# Patient Record
Sex: Female | Born: 1945 | Race: White | Hispanic: No | Smoking: Former smoker
Health system: Southern US, Community
[De-identification: ages and names within clinical notes are randomized; demographics above are authoritative.]

## PROBLEM LIST (undated history)

## (undated) DIAGNOSIS — F419 Anxiety disorder, unspecified: Secondary | ICD-10-CM

## (undated) DIAGNOSIS — G4730 Sleep apnea, unspecified: Secondary | ICD-10-CM

## (undated) DIAGNOSIS — K635 Polyp of colon: Secondary | ICD-10-CM

## (undated) DIAGNOSIS — J40 Bronchitis, not specified as acute or chronic: Secondary | ICD-10-CM

## (undated) DIAGNOSIS — K219 Gastro-esophageal reflux disease without esophagitis: Secondary | ICD-10-CM

## (undated) DIAGNOSIS — R0902 Hypoxemia: Secondary | ICD-10-CM

## (undated) DIAGNOSIS — H269 Unspecified cataract: Secondary | ICD-10-CM

## (undated) DIAGNOSIS — G43909 Migraine, unspecified, not intractable, without status migrainosus: Secondary | ICD-10-CM

## (undated) DIAGNOSIS — Z87440 Personal history of urinary (tract) infections: Secondary | ICD-10-CM

## (undated) DIAGNOSIS — T7840XA Allergy, unspecified, initial encounter: Secondary | ICD-10-CM

## (undated) DIAGNOSIS — F329 Major depressive disorder, single episode, unspecified: Secondary | ICD-10-CM

## (undated) DIAGNOSIS — I10 Essential (primary) hypertension: Secondary | ICD-10-CM

## (undated) DIAGNOSIS — R011 Cardiac murmur, unspecified: Secondary | ICD-10-CM

## (undated) DIAGNOSIS — G4733 Obstructive sleep apnea (adult) (pediatric): Secondary | ICD-10-CM

## (undated) DIAGNOSIS — R112 Nausea with vomiting, unspecified: Secondary | ICD-10-CM

## (undated) DIAGNOSIS — L719 Rosacea, unspecified: Secondary | ICD-10-CM

## (undated) DIAGNOSIS — E785 Hyperlipidemia, unspecified: Secondary | ICD-10-CM

## (undated) DIAGNOSIS — Z9889 Other specified postprocedural states: Secondary | ICD-10-CM

## (undated) DIAGNOSIS — Z8619 Personal history of other infectious and parasitic diseases: Secondary | ICD-10-CM

## (undated) DIAGNOSIS — M1990 Unspecified osteoarthritis, unspecified site: Secondary | ICD-10-CM

## (undated) DIAGNOSIS — F32A Depression, unspecified: Secondary | ICD-10-CM

## (undated) HISTORY — PX: OTHER SURGICAL HISTORY: SHX169

## (undated) HISTORY — DX: Bronchitis, not specified as acute or chronic: J40

## (undated) HISTORY — DX: Personal history of other infectious and parasitic diseases: Z86.19

## (undated) HISTORY — DX: Sleep apnea, unspecified: G47.30

## (undated) HISTORY — DX: Cardiac murmur, unspecified: R01.1

## (undated) HISTORY — DX: Essential (primary) hypertension: I10

## (undated) HISTORY — DX: Migraine, unspecified, not intractable, without status migrainosus: G43.909

## (undated) HISTORY — DX: Rosacea, unspecified: L71.9

## (undated) HISTORY — DX: Depression, unspecified: F32.A

## (undated) HISTORY — DX: Hyperlipidemia, unspecified: E78.5

## (undated) HISTORY — DX: Unspecified osteoarthritis, unspecified site: M19.90

## (undated) HISTORY — PX: FOOT SURGERY: SHX648

## (undated) HISTORY — DX: Allergy, unspecified, initial encounter: T78.40XA

## (undated) HISTORY — DX: Anxiety disorder, unspecified: F41.9

## (undated) HISTORY — DX: Gastro-esophageal reflux disease without esophagitis: K21.9

## (undated) HISTORY — PX: COSMETIC SURGERY: SHX468

## (undated) HISTORY — DX: Hypoxemia: R09.02

## (undated) HISTORY — DX: Polyp of colon: K63.5

## (undated) HISTORY — DX: Major depressive disorder, single episode, unspecified: F32.9

## (undated) HISTORY — DX: Personal history of urinary (tract) infections: Z87.440

## (undated) HISTORY — DX: Unspecified cataract: H26.9

---

## 2001-03-24 ENCOUNTER — Other Ambulatory Visit: Admission: RE | Admit: 2001-03-24 | Discharge: 2001-03-24 | Payer: Self-pay | Admitting: Internal Medicine

## 2002-03-30 ENCOUNTER — Other Ambulatory Visit: Admission: RE | Admit: 2002-03-30 | Discharge: 2002-03-30 | Payer: Self-pay | Admitting: Internal Medicine

## 2004-08-21 ENCOUNTER — Ambulatory Visit: Payer: Self-pay | Admitting: Internal Medicine

## 2004-10-29 ENCOUNTER — Ambulatory Visit: Payer: Self-pay | Admitting: Internal Medicine

## 2006-01-02 ENCOUNTER — Ambulatory Visit: Payer: Self-pay | Admitting: Internal Medicine

## 2006-07-26 ENCOUNTER — Ambulatory Visit: Payer: Self-pay | Admitting: Gastroenterology

## 2007-02-25 ENCOUNTER — Inpatient Hospital Stay: Payer: Self-pay | Admitting: Otolaryngology

## 2007-02-25 ENCOUNTER — Other Ambulatory Visit: Payer: Self-pay

## 2007-03-05 ENCOUNTER — Ambulatory Visit: Payer: Self-pay | Admitting: Internal Medicine

## 2008-03-30 ENCOUNTER — Ambulatory Visit: Payer: Self-pay | Admitting: Internal Medicine

## 2008-08-02 ENCOUNTER — Ambulatory Visit: Payer: Self-pay | Admitting: Internal Medicine

## 2009-04-24 ENCOUNTER — Ambulatory Visit: Payer: Self-pay | Admitting: Internal Medicine

## 2009-07-26 LAB — HM COLONOSCOPY: HM Colonoscopy: NORMAL

## 2009-08-02 ENCOUNTER — Ambulatory Visit: Payer: Self-pay | Admitting: Gastroenterology

## 2009-12-24 LAB — HM PAP SMEAR: HM Pap smear: NORMAL

## 2009-12-24 LAB — HM MAMMOGRAPHY: HM Mammogram: NORMAL

## 2010-05-22 ENCOUNTER — Ambulatory Visit: Payer: Self-pay | Admitting: Internal Medicine

## 2010-10-24 ENCOUNTER — Emergency Department: Payer: Self-pay | Admitting: Emergency Medicine

## 2011-01-01 ENCOUNTER — Ambulatory Visit (INDEPENDENT_AMBULATORY_CARE_PROVIDER_SITE_OTHER): Payer: BC Managed Care – PPO | Admitting: Family Medicine

## 2011-01-01 ENCOUNTER — Encounter: Payer: Self-pay | Admitting: Family Medicine

## 2011-01-01 DIAGNOSIS — M949 Disorder of cartilage, unspecified: Secondary | ICD-10-CM

## 2011-01-01 DIAGNOSIS — E785 Hyperlipidemia, unspecified: Secondary | ICD-10-CM | POA: Insufficient documentation

## 2011-01-01 DIAGNOSIS — M899 Disorder of bone, unspecified: Secondary | ICD-10-CM

## 2011-01-01 DIAGNOSIS — L719 Rosacea, unspecified: Secondary | ICD-10-CM

## 2011-01-01 DIAGNOSIS — K219 Gastro-esophageal reflux disease without esophagitis: Secondary | ICD-10-CM | POA: Insufficient documentation

## 2011-01-01 DIAGNOSIS — J309 Allergic rhinitis, unspecified: Secondary | ICD-10-CM | POA: Insufficient documentation

## 2011-01-01 DIAGNOSIS — M810 Age-related osteoporosis without current pathological fracture: Secondary | ICD-10-CM | POA: Insufficient documentation

## 2011-01-01 DIAGNOSIS — Z23 Encounter for immunization: Secondary | ICD-10-CM

## 2011-01-01 DIAGNOSIS — F411 Generalized anxiety disorder: Secondary | ICD-10-CM

## 2011-01-01 DIAGNOSIS — I10 Essential (primary) hypertension: Secondary | ICD-10-CM | POA: Insufficient documentation

## 2011-01-01 DIAGNOSIS — F419 Anxiety disorder, unspecified: Secondary | ICD-10-CM | POA: Insufficient documentation

## 2011-01-01 DIAGNOSIS — M8580 Other specified disorders of bone density and structure, unspecified site: Secondary | ICD-10-CM | POA: Insufficient documentation

## 2011-01-01 DIAGNOSIS — Z0000 Encounter for general adult medical examination without abnormal findings: Secondary | ICD-10-CM | POA: Insufficient documentation

## 2011-01-01 DIAGNOSIS — E559 Vitamin D deficiency, unspecified: Secondary | ICD-10-CM | POA: Insufficient documentation

## 2011-01-01 DIAGNOSIS — G4730 Sleep apnea, unspecified: Secondary | ICD-10-CM

## 2011-01-01 NOTE — Assessment & Plan Note (Signed)
On 800iu daily and has osteopenia  Check this level with fasting lab and disc at f/u

## 2011-01-01 NOTE — Assessment & Plan Note (Signed)
Schedule fasting labs for lipids and liver  Disc low sat fat diet Return for PE

## 2011-01-01 NOTE — Assessment & Plan Note (Signed)
Ok on nexium Disc reflux diet  Also wt loss needed No changes

## 2011-01-01 NOTE — Progress Notes (Signed)
Subjective:    Patient ID: Adriana Marks, female    DOB: 01-27-1946, 65 y.o.   MRN: [id number]  HPI Here to est as a new pt  Used to see Dr Marlou Sa in [place]  Wanted a female doctor - and her husband is changing to Dr Alphonsus Sias   Depression - thinks she had it all her life and did not know it , not severe  Is on anxiety (not on depression)  Anxiety is intermittent -- has not seen a counselor Takes xanax bid every day -- can take 3 times occ if she needs to sleep  Thinks is a combination of stress and also genetics  Lots of work stress and family problems   GERD on nexium --does very well on that medicine - 2 years No breakthrough (other meds did not work)  h pylori test was neg  Had endoscopy at [facility] in the past -- HH   Has had bone density tests - 2 years ago -- osteopenia  Does have vitamin D deficiency -- is taking vitamin D (is getting 800 iu per day)  Last checked about 6 months ago   Hx of heart M This is recently -- would feel fluttering /palpitations  Had work up  With [employer] -- wore monitor -- had "skip " in her heartbeat -- did not do anything special for it  Has had nuclear stress and holter and echo and labs  She is on inderal    HTN - is well controlled with 130/80 today    Hyperlipidemia  -- is on crestor  Has not had a test in over a year  Watches diet fairly -some sat fats   Migraine hx  - more or less in the past -- not really now   Hx of colon polyps  Last colonosc was 12/10 -- at at [facility] medical with Dr Park Breed --no polyp -- told 5 years  One prior to that 3 years was at [facility]- polyps were removed -- had 3 year f/u  No family history of colon cancer   Past Medical History  Diagnosis Date  . History of chicken pox   . Depression   . Bronchitis   . GERD (gastroesophageal reflux disease)   . Allergy   . Heart murmur   . Hypertension   . Hyperlipidemia   . Migraines   . Colon polyps   . Hx: UTI (urinary  tract infection)   . Sleep apnea     uses cpap every night   . Rosacea     History   Social History  . Marital Status: Married    Spouse Name: N/A    Number of Children: N/A  . Years of Education: N/A   Occupational History  . bank teller    Social History Main Topics  . Smoking status: Never Smoker   . Smokeless tobacco: Not on file  . Alcohol Use: No  . Drug Use: No  . Sexually Active: Not on file   Other Topics Concern  . 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 Heart disease Mother   .  Heart disease Father   . Hypertension Father   . Stroke Paternal Grandmother   . Heart disease Paternal Grandfather   . Hypertension Paternal Grandfather   . Diabetes Other   . Breast cancer Paternal Aunt    No Known Allergies  No past surgical history on file.      Review of Systems Review of Systems  Constitutional: Negative for fever, appetite change, fatigue and unexpected weight change.  Eyes: Negative for pain and visual disturbance. ENT pos for runny and stuffy nose from allergies   Respiratory: Negative for cough and shortness of breath.   Cardiovascular: Negative for cp or edema or sob    Gastrointestinal: Negative for nausea, diarrhea and constipation.  Genitourinary: Negative for urgency and frequency.  Skin: Negative for pallor.  Neurological: Negative for weakness, light-headedness, numbness and headaches.  Hematological: Negative for adenopathy. Does not bruise/bleed easily.  Psychiatric/Behavioral: Negative for dysphoric mood. Pos for anx, neg for SI         Objective:   Physical Exam  Constitutional: She appears well-developed and well-nourished. No distress.       overwt  and well appearing   HENT:  Head: Normocephalic and atraumatic.       Nares are boggy and pale  Eyes: Conjunctivae and EOM are normal. Pupils are equal, round, and reactive to light.  Neck: Normal range of motion. Neck supple. No JVD present. Carotid bruit is not present. No thyromegaly present.  Cardiovascular: Normal rate, regular rhythm and normal heart sounds.  Exam reveals no gallop.   Pulmonary/Chest: Effort normal and breath sounds normal. No respiratory distress. She has no wheezes.  Abdominal: Soft. Bowel sounds are normal. She exhibits no distension, no abdominal bruit and no mass. There is no tenderness.  Musculoskeletal: She exhibits no edema and no tenderness.       Some OA changes noted   Lymphadenopathy:    She has no cervical adenopathy.  Neurological: She is alert. She has normal reflexes. Coordination normal.  Skin: Skin is warm and dry. No rash noted. No erythema. No pallor.  Psychiatric: She has a normal mood and affect.          Assessment & Plan:

## 2011-01-01 NOTE — Patient Instructions (Addendum)
Please send for last cardiac study results and ekg from Dr Armanda Magic (cardiol)  Please send for last immunization record,  Dexa, mammogram, labs from primary care Tdap shot today Schedule fasting labs and then a PE when able --- (any 30 min appt is fine with me)  We will discuss anxiety treatment options at future visit

## 2011-01-01 NOTE — Assessment & Plan Note (Signed)
Good control with current meds sched fasting lab and then f/u PE

## 2011-01-03 NOTE — Assessment & Plan Note (Signed)
Not severe  Is seasonal Disc tx options

## 2011-01-03 NOTE — Assessment & Plan Note (Signed)
Improved with cpap Uses it nightly

## 2011-01-03 NOTE — Assessment & Plan Note (Signed)
Pt takes regular xanax but has never tried long acting or any ssri Will disc this in future Disc habit forming pot of the xanax  Disc stressors in detail Could benefit from counseling for this  Will disc in more detail at next visit

## 2011-01-15 ENCOUNTER — Encounter: Payer: Self-pay | Admitting: Family Medicine

## 2011-01-16 ENCOUNTER — Encounter: Payer: Self-pay | Admitting: Family Medicine

## 2011-03-06 ENCOUNTER — Other Ambulatory Visit (INDEPENDENT_AMBULATORY_CARE_PROVIDER_SITE_OTHER): Payer: BC Managed Care – PPO | Admitting: Family Medicine

## 2011-03-06 DIAGNOSIS — M8580 Other specified disorders of bone density and structure, unspecified site: Secondary | ICD-10-CM

## 2011-03-06 DIAGNOSIS — Z0000 Encounter for general adult medical examination without abnormal findings: Secondary | ICD-10-CM

## 2011-03-06 DIAGNOSIS — E785 Hyperlipidemia, unspecified: Secondary | ICD-10-CM

## 2011-03-06 LAB — COMPREHENSIVE METABOLIC PANEL
ALT: 21 U/L (ref 0–35)
AST: 21 U/L (ref 0–37)
Albumin: 4.4 g/dL (ref 3.5–5.2)
Alkaline Phosphatase: 52 U/L (ref 39–117)
BUN: 14 mg/dL (ref 6–23)
CO2: 31 mEq/L (ref 19–32)
Calcium: 9.3 mg/dL (ref 8.4–10.5)
Chloride: 105 mEq/L (ref 96–112)
Creatinine, Ser: 0.6 mg/dL (ref 0.4–1.2)
GFR: 105.21 mL/min (ref >60.00)
Glucose, Bld: 103 mg/dL — ABNORMAL HIGH (ref 70–99)
Potassium: 4.2 mEq/L (ref 3.5–5.1)
Sodium: 139 mEq/L (ref 135–145)
Total Bilirubin: 0.2 mg/dL — ABNORMAL LOW (ref 0.3–1.2)
Total Protein: 7.5 g/dL (ref 6.0–8.3)

## 2011-03-06 LAB — CBC WITH DIFFERENTIAL/PLATELET
Basophils Absolute: 0 10*3/uL (ref 0.0–0.1)
Basophils Relative: 0.5 % (ref 0.0–3.0)
Eosinophils Absolute: 0.2 10*3/uL (ref 0.0–0.7)
Eosinophils Relative: 2.6 % (ref 0.0–5.0)
HCT: 39.6 % (ref 36.0–46.0)
Hemoglobin: 13.7 g/dL (ref 12.0–15.0)
Lymphocytes Relative: 33.5 % (ref 12.0–46.0)
Lymphs Abs: 2.4 10*3/uL (ref 0.7–4.0)
MCHC: 34.6 g/dL (ref 30.0–36.0)
MCV: 93 fl (ref 78.0–100.0)
Monocytes Absolute: 0.6 10*3/uL (ref 0.1–1.0)
Monocytes Relative: 8.7 % (ref 3.0–12.0)
Neutro Abs: 3.9 10*3/uL (ref 1.4–7.7)
Neutrophils Relative %: 54.7 % (ref 43.0–77.0)
Platelets: 223 10*3/uL (ref 150.0–400.0)
RBC: 4.26 Mil/uL (ref 3.87–5.11)
RDW: 12.6 % (ref 11.5–14.6)
WBC: 7.2 10*3/uL (ref 4.5–10.5)

## 2011-03-06 LAB — LIPID PANEL
Cholesterol: 142 mg/dL (ref 0–200)
HDL: 55.5 mg/dL (ref >39.00)
LDL Cholesterol: 79 mg/dL (ref 0–99)
Total CHOL/HDL Ratio: 3
Triglycerides: 39 mg/dL (ref 0.0–149.0)
VLDL: 7.8 mg/dL (ref 0.0–40.0)

## 2011-03-06 LAB — TSH: TSH: 1.87 u[IU]/mL (ref 0.35–5.50)

## 2011-03-07 ENCOUNTER — Other Ambulatory Visit: Payer: BC Managed Care – PPO

## 2011-03-07 LAB — VITAMIN D 25 HYDROXY (VIT D DEFICIENCY, FRACTURES): Vit D, 25-Hydroxy: 34 ng/mL (ref 30–89)

## 2011-03-22 ENCOUNTER — Encounter: Payer: Self-pay | Admitting: Family Medicine

## 2011-03-22 ENCOUNTER — Ambulatory Visit (INDEPENDENT_AMBULATORY_CARE_PROVIDER_SITE_OTHER): Payer: BC Managed Care – PPO | Admitting: Family Medicine

## 2011-03-22 VITALS — BP 138/80 | HR 72 | Temp 98.0°F | Ht 62.5 in | Wt 175.5 lb

## 2011-03-22 DIAGNOSIS — M899 Disorder of bone, unspecified: Secondary | ICD-10-CM

## 2011-03-22 DIAGNOSIS — Z0000 Encounter for general adult medical examination without abnormal findings: Secondary | ICD-10-CM

## 2011-03-22 DIAGNOSIS — M8580 Other specified disorders of bone density and structure, unspecified site: Secondary | ICD-10-CM

## 2011-03-22 DIAGNOSIS — I10 Essential (primary) hypertension: Secondary | ICD-10-CM

## 2011-03-22 DIAGNOSIS — E785 Hyperlipidemia, unspecified: Secondary | ICD-10-CM

## 2011-03-22 DIAGNOSIS — Z1231 Encounter for screening mammogram for malignant neoplasm of breast: Secondary | ICD-10-CM | POA: Insufficient documentation

## 2011-03-22 NOTE — Assessment & Plan Note (Signed)
dexa was normal in 09- will re check this at age 65 unless needed earlier

## 2011-03-22 NOTE — Assessment & Plan Note (Signed)
Nl exam sched mam Enc self exams

## 2011-03-22 NOTE — Assessment & Plan Note (Signed)
Fair control- even better at home Disc re incorporating exercise program  No change in med

## 2011-03-22 NOTE — Patient Instructions (Signed)
If you are interested in shingles vaccine in future - call your insurance company to see how coverage is and call us to schedule We will schedule mammogram at check out  Keep working on healthy diet and exercise No change in medicines

## 2011-03-22 NOTE — Progress Notes (Signed)
Subjective:    Patient ID: Adriana Marks, female    DOB: 07/18/1946, 65 y.o.   MRN: [id number]  HPI Here for annual exam and also to review chronic medical problems Is feeling ok   Moved job - to [place] - likes it there - at [employer]    Zoster status Never had the dz or vaccine  She is interested in the vaccine    Mam 7/11 Self exam  No lumps or problems   Pap 9/11  Gyn - no problems , no bleeding  No hx of abn paps   Colonoscopy 12/10 Did not find any polyps  Will f/u 5 years --2015 No bowel change   Tdap5/12  dexa nl 8/09- was normal  Is taking her calcium and vit D   HTN- bp is 138/80 today Feels fine  Runs 130s/80s or lower at home No ha or cp  No change in meds   Lipids on crestor  Lab Results  Component Value Date   CHOL 142 03/06/2011   Lab Results  Component Value Date   HDL 55.50 03/06/2011   Lab Results  Component Value Date   LDLCALC 79 03/06/2011   Lab Results  Component Value Date   TRIG 39.0 03/06/2011   Lab Results  Component Value Date   CHOLHDL 3 03/06/2011   No results found for this basename: LDLDIRECT   overall good control  No side eff of crestor   Patient Active Problem List  Diagnoses  . Hyperlipidemia  . Hypertension  . GERD (gastroesophageal reflux disease)  . Routine general medical examination at a health care facility  . Vitamin D deficiency  . Osteopenia  . Sleep apnea  . Anxiety  . Allergic rhinitis  . Rosacea  . Other screening mammogram   Past Medical History  Diagnosis Date  . History of chicken pox   . Depression   . Bronchitis   . GERD (gastroesophageal reflux disease)   . Allergy   . Heart murmur   . Hypertension   . Hyperlipidemia   . Migraines   . Colon polyps   . Hx: UTI (urinary tract infection)   . Sleep apnea     uses cpap every night   . Rosacea    No past surgical history on file. History  Substance Use Topics  . Smoking status: Never Smoker   . Smokeless tobacco: Not on  file  . Alcohol Use: No   Family History  Problem Relation Age of Onset  . Heart disease Mother   . Heart disease Father   . Hypertension Father   . Stroke Paternal Grandmother   . Heart disease Paternal Grandfather   . Hypertension Paternal Grandfather   . Diabetes Other   . Breast cancer Paternal Aunt    No Known Allergies Current Outpatient Prescriptions on File Prior to Visit  Medication Sig Dispense Refill  . ALPRAZolam (XANAX) 0.5 MG tablet Take 0.5 mg by mouth 3 (three) times daily as needed.        Marland Kitchen aspirin 81 MG tablet Take 81 mg by mouth daily.        . Calcium Carbonate-Vitamin D 600-400 MG-UNIT per tablet Take 1 tablet by mouth 2 (two) times daily.        Marland Kitchen docusate sodium (COLACE) 100 MG capsule Take 100 mg by mouth daily.        Marland Kitchen esomeprazole (NEXIUM) 40 MG capsule Take 40 mg by mouth daily before breakfast.        .  fexofenadine (ALLEGRA) 180 MG tablet Take 180 mg by mouth daily.        Marland Kitchen lisinopril-hydrochlorothiazide (PRINZIDE,ZESTORETIC) 20-25 MG per tablet Take 1 tablet by mouth daily.        . metroNIDAZOLE (METROGEL) 1 % gel Apply topically to face at bedtime       . propranolol (INDERAL) 10 MG tablet Take 10 mg by mouth 3 (three) times daily as needed.        . rosuvastatin (CRESTOR) 10 MG tablet Take 10 mg by mouth daily.              Review of Systems Review of Systems  Constitutional: Negative for fever, appetite change, fatigue and unexpected weight change.  Eyes: Negative for pain and visual disturbance.  Respiratory: Negative for cough and shortness of breath.   Cardiovascular: Negative.  for cp or palpitations  Gastrointestinal: Negative for nausea, diarrhea and constipation.  Genitourinary: Negative for urgency and frequency.  Skin: Negative for pallor. or rash or lesions Neurological: Negative for weakness, light-headedness, numbness and headaches.  Hematological: Negative for adenopathy. Does not bruise/bleed easily.    Psychiatric/Behavioral: Negative for dysphoric mood. The patient is not nervous/anxious.          Objective:   Physical Exam  Constitutional: She appears well-developed and well-nourished. No distress.       overwt and well appearing   HENT:  Head: Normocephalic and atraumatic.  Right Ear: External ear normal.  Left Ear: External ear normal.  Nose: Nose normal.  Mouth/Throat: Oropharynx is clear and moist.  Eyes: Conjunctivae and EOM are normal. Pupils are equal, round, and reactive to light.  Neck: Normal range of motion. Neck supple. No JVD present. Carotid bruit is not present. Erythema present. No thyromegaly present.  Cardiovascular: Normal rate, regular rhythm, normal heart sounds and intact distal pulses.   Pulmonary/Chest: Effort normal and breath sounds normal. No respiratory distress. She has no wheezes.  Abdominal: Soft. Bowel sounds are normal. She exhibits no distension, no abdominal bruit and no mass. There is no tenderness.  Genitourinary: No breast swelling, tenderness, discharge or bleeding.  Musculoskeletal: Normal range of motion. She exhibits no edema and no tenderness.  Lymphadenopathy:    She has no cervical adenopathy.  Neurological: She is alert. She has normal reflexes. No cranial nerve deficit. Coordination normal.  Skin: Skin is warm and dry. No rash noted. No erythema. No pallor.  Psychiatric: She has a normal mood and affect.          Assessment & Plan:   No problem-specific assessment & plan notes found for this encounter.

## 2011-03-22 NOTE — Assessment & Plan Note (Signed)
Well controlled on crestor and diet  Rev lab with pt  Rev low sat fat diet

## 2011-03-22 NOTE — Assessment & Plan Note (Signed)
Reviewed health habits including diet and exercise and skin cancer prevention Also reviewed health mt list, fam hx and immunizations  Rev wellness labs Disc zostavax- will think about it

## 2011-04-22 ENCOUNTER — Other Ambulatory Visit: Payer: Self-pay | Admitting: Family Medicine

## 2011-04-23 ENCOUNTER — Other Ambulatory Visit: Payer: Self-pay | Admitting: *Deleted

## 2011-04-23 NOTE — Telephone Encounter (Signed)
Was it last filled by me ? -- please clarify with her how often she takes it / how many needed per month  Thanks

## 2011-04-23 NOTE — Telephone Encounter (Signed)
[facility] electronically request refill Inderal 10mg . Please advise.

## 2011-04-23 NOTE — Telephone Encounter (Signed)
Will refill electronically  

## 2011-04-23 NOTE — Telephone Encounter (Signed)
[facility] faxed refill request for Alprazolam 0.5mg  #90.Please advise.

## 2011-04-23 NOTE — Telephone Encounter (Signed)
Xanax last filled on 03/16/11.

## 2011-04-24 MED ORDER — ALPRAZOLAM 0.5 MG PO TABS: 0.5000 mg | ORAL_TABLET | Freq: Three times a day (TID) | ORAL | Status: DC | PRN

## 2011-04-24 NOTE — Telephone Encounter (Signed)
I can refil this once but we need to discuss it further before further refils - not a medicine I am very comfortable using with this frequency  Try to use only when absolutely needed Please schedule f/u Px written for call in

## 2011-04-24 NOTE — Telephone Encounter (Signed)
Patient notified as instructed by telephone. Pt said she has been able to taper to taking Alprazolam 0.5mg  once in the AM and then at bedtime. Pt also hopes with new job she will have less stress. Pt said will ck work schedule and call for appt before this med is completed.

## 2011-04-24 NOTE — Telephone Encounter (Signed)
Pt not at home but spoke with pt's husband and pt takes Alprazolam 0.5mg  one tablet q8h due to stress. Pt has changed jobs and hopes that will decrease stress level but for now pt still taking q8h. Christen Bame said pt usually gets #90 and was last refilled by Dr Buren Kos but pt changed to Dr Milinda Antis as primary care doctor and was seen by Dr Milinda Antis on 0727/12. Pt will ck late today or tomorrow to make sure refills are at pharmacy.

## 2011-04-24 NOTE — Telephone Encounter (Signed)
Medication [facility] [address] [facility] as instructed.  

## 2011-05-13 ENCOUNTER — Other Ambulatory Visit: Payer: Self-pay | Admitting: *Deleted

## 2011-05-13 MED ORDER — ROSUVASTATIN CALCIUM 10 MG PO TABS: 10.0000 mg | ORAL_TABLET | Freq: Every day | ORAL | Status: DC

## 2011-06-10 ENCOUNTER — Ambulatory Visit (INDEPENDENT_AMBULATORY_CARE_PROVIDER_SITE_OTHER): Payer: BC Managed Care – PPO | Admitting: Family Medicine

## 2011-06-10 ENCOUNTER — Encounter: Payer: Self-pay | Admitting: Family Medicine

## 2011-06-10 VITALS — BP 140/78 | HR 64 | Temp 98.2°F | Ht 62.5 in | Wt 179.5 lb

## 2011-06-10 DIAGNOSIS — F419 Anxiety disorder, unspecified: Secondary | ICD-10-CM

## 2011-06-10 DIAGNOSIS — F411 Generalized anxiety disorder: Secondary | ICD-10-CM

## 2011-06-10 MED ORDER — SERTRALINE HCL 50 MG PO TABS: 50.0000 mg | ORAL_TABLET | Freq: Every day | ORAL | Status: DC

## 2011-06-10 NOTE — Assessment & Plan Note (Signed)
This is worse with recent inc in stressors Disc this in detail along with coping mech/ symptoms/ tx opt and poss side eff Agreed need to get her off xanax if possible Trial of zoloft 25-titrate to 50 If worse or suicidal thoughts - will stop med and call  F/u planned >25 min spent with face to face with patient, >50% counseling and/or coordinating care

## 2011-06-10 NOTE — Progress Notes (Signed)
Subjective:    Patient ID: Adriana Marks, female    DOB: 10-30-1945, 65 y.o.   MRN: [id number]  HPI Not doing well with anxiety  A lot of stress at work-- moved from [place] to [employer] [address] - is very understaffed  Husband had MI and cabg and then GI blockage  He is generally short tempered  She herself had workup for palpitations that was b9-- assoc with anxiety   Last time she was here - tried to take just one xanax a day- and that did not work well - mind raced and could not sleep or relax  Takes it twice a day now - is somewhat helpful   Tries to talk to her husband- but this was not a good person to vent to --he gets anx too  Talked to a counselor at work - not very helpful (more of an HR person)  Talks to some church friends-many of them are in the same situation  Watches TV and does some cooks - and that does help  Likes to walk -but not a lot of time, has a stationary bike   Would like to change anx meds  Would like to see a counselor in the future if insurance pays for it   Thinks she does have occ panic attacks with racing heart and feeling of doom  Patient Active Problem List  Diagnoses  . Hyperlipidemia  . Hypertension  . GERD (gastroesophageal reflux disease)  . Routine general medical examination at a health care facility  . Vitamin D deficiency  . Osteopenia  . Sleep apnea  . Anxiety  . Allergic rhinitis  . Rosacea  . Other screening mammogram   Past Medical History  Diagnosis Date  . History of chicken pox   . Depression   . Bronchitis   . GERD (gastroesophageal reflux disease)   . Allergy   . Heart murmur   . Hypertension   . Hyperlipidemia   . Migraines   . Colon polyps   . Hx: UTI (urinary tract infection)   . Sleep apnea     uses cpap every night   . Rosacea    No past surgical history on file. History  Substance Use Topics  . Smoking status: Never Smoker   . Smokeless tobacco: Not on file  . Alcohol Use: No   Family History  Problem  Relation Age of Onset  . Heart disease Mother   . Heart disease Father   . Hypertension Father   . Stroke Paternal Grandmother   . Heart disease Paternal Grandfather   . Hypertension Paternal Grandfather   . Diabetes Other   . Breast cancer Paternal Aunt    No Known Allergies Current Outpatient Prescriptions on File Prior to Visit  Medication Sig Dispense Refill  . ALPRAZolam (XANAX) 0.5 MG tablet Take 1 tablet (0.5 mg total) by mouth 3 (three) times daily as needed.  90 tablet  0  . aspirin 81 MG tablet Take 81 mg by mouth daily.        . Calcium Carbonate-Vitamin D 600-400 MG-UNIT per tablet Take 1 tablet by mouth daily.       Marland Kitchen docusate sodium (COLACE) 100 MG capsule Take 100 mg by mouth daily.        Marland Kitchen esomeprazole (NEXIUM) 40 MG capsule Take 40 mg by mouth daily before breakfast.        . fexofenadine (ALLEGRA) 180 MG tablet Take 180 mg by mouth daily.        Marland Kitchen  lisinopril-hydrochlorothiazide (PRINZIDE,ZESTORETIC) 20-25 MG per tablet Take 1 tablet by mouth daily.        . metroNIDAZOLE (METROGEL) 1 % gel Apply topically to face at bedtime       . propranolol (INDERAL) 10 MG tablet TAKE 1 TABLET EVERY 8 HOURS  90 tablet  11  . rosuvastatin (CRESTOR) 10 MG tablet Take 1 tablet (10 mg total) by mouth daily.  30 tablet  3      Review of Systems Review of Systems  Constitutional: Negative for fever, appetite change, fatigue and unexpected weight change.  Eyes: Negative for pain and visual disturbance.  Respiratory: Negative for cough and shortness of breath.   Cardiovascular: Negative for cp or edema    Gastrointestinal: Negative for nausea, diarrhea and constipation.  Genitourinary: Negative for urgency and frequency.  Skin: Negative for pallor or rash   Neurological: Negative for weakness, light-headedness, numbness and headaches.  Hematological: Negative for adenopathy. Does not bruise/bleed easily.  Psychiatric/Behavioral: pos for general anxious mood with panic attacks,  perhaps mild depression , no SI          Objective:   Physical Exam  Constitutional: She appears well-developed and well-nourished. No distress.       overwt and well appearing   HENT:  Head: Normocephalic and atraumatic.  Mouth/Throat: Oropharynx is clear and moist.  Eyes: Conjunctivae and EOM are normal. Pupils are equal, round, and reactive to light.  Neck: Normal range of motion. Neck supple. No JVD present. Carotid bruit is not present. No thyromegaly present.  Cardiovascular: Normal rate, regular rhythm, normal heart sounds and intact distal pulses.   Pulmonary/Chest: Effort normal and breath sounds normal. No respiratory distress. She has no wheezes.  Musculoskeletal: She exhibits no tenderness.  Lymphadenopathy:    She has no cervical adenopathy.  Neurological: She is alert. She has normal reflexes. No cranial nerve deficit. She exhibits normal muscle tone. Coordination normal.       No tremor   Skin: Skin is warm and dry. No erythema. No pallor.  Psychiatric:       Generally nervous but good eye contact and comm skills Pt disc her stressors and symptoms freely          Assessment & Plan:

## 2011-06-10 NOTE — Patient Instructions (Addendum)
Start the zoloft - (sertaline) at 1/2 pill each evening for a week , and then if no problems after a week increase to 50 mg each evening  If any side effects or if you feel worse- stop it and then let me know  Continue the xanax as needed and have pharmacy call when you need a refil  Call insurance about coverage for psychological counseling and then call us for referral if you are interested  Keep up exercise and avoid caffeine as much as possible  Follow up with me in 4-6 week

## 2011-06-11 ENCOUNTER — Encounter: Payer: Self-pay | Admitting: Family Medicine

## 2011-06-11 ENCOUNTER — Ambulatory Visit: Payer: Self-pay | Admitting: Family Medicine

## 2011-06-12 ENCOUNTER — Encounter: Payer: Self-pay | Admitting: *Deleted

## 2011-06-19 ENCOUNTER — Other Ambulatory Visit: Payer: Self-pay | Admitting: *Deleted

## 2011-06-19 MED ORDER — ESOMEPRAZOLE MAGNESIUM 40 MG PO CPDR: 40.0000 mg | DELAYED_RELEASE_CAPSULE | Freq: Every day | ORAL | Status: DC

## 2011-06-21 ENCOUNTER — Other Ambulatory Visit: Payer: Self-pay | Admitting: *Deleted

## 2011-06-21 MED ORDER — ALPRAZOLAM 0.5 MG PO TABS: 0.5000 mg | ORAL_TABLET | Freq: Three times a day (TID) | ORAL | Status: DC | PRN

## 2011-06-21 NOTE — Telephone Encounter (Signed)
Sounds like we [facility] not been getting [facility] lately! Marland Kitchen

## 2011-06-21 NOTE — Telephone Encounter (Signed)
Phoned request from pt's husband.  He said pharmacy faxed a request on Monday.  Pt is down to one tablet, only takes this when she needs to. [facility] [address]

## 2011-06-21 NOTE — Telephone Encounter (Signed)
Rx called in as directed.   

## 2011-06-21 NOTE — Telephone Encounter (Signed)
[facility]?-this keeps happening Px written for call in

## 2011-07-23 ENCOUNTER — Encounter: Payer: Self-pay | Admitting: Family Medicine

## 2011-07-23 ENCOUNTER — Ambulatory Visit: Payer: BC Managed Care – PPO | Admitting: Family Medicine

## 2011-07-23 ENCOUNTER — Ambulatory Visit (INDEPENDENT_AMBULATORY_CARE_PROVIDER_SITE_OTHER): Payer: BC Managed Care – PPO | Admitting: Family Medicine

## 2011-07-23 ENCOUNTER — Ambulatory Visit: Payer: BC Managed Care – PPO | Admitting: *Deleted

## 2011-07-23 VITALS — BP 138/86 | HR 76 | Temp 98.3°F | Ht 62.5 in | Wt 176.5 lb

## 2011-07-23 DIAGNOSIS — F411 Generalized anxiety disorder: Secondary | ICD-10-CM

## 2011-07-23 DIAGNOSIS — F419 Anxiety disorder, unspecified: Secondary | ICD-10-CM

## 2011-07-23 NOTE — Patient Instructions (Signed)
Call back if you want appt with Dr Patsy Lager for your foot or make appt on the way out  Continue the zoloft at same dose Try to take xanax only when you need it  Follow up with me in 3 months  I'm glad you are doing better

## 2011-07-23 NOTE — Progress Notes (Signed)
Subjective:    Patient ID: Adriana Marks, female    DOB: 1946/01/25, 65 y.o.   MRN: [id number]  HPI Here for f/u of anxiety - last visit added zoloft 50 for anx/ and panic attacks in setting of severe stress Also on xanax   bp 138/86 which is imp  Wt is down 3 lb with bmi of 31  Thinks she is doing a lot better overall -- and husband notices a big difference zoloft - headache for 2 days- that went away  No more panic attacks  Able to deal with things go -- and less worry   Stress level is about the same -- will be that way for some time   Xanax -- takes one at bedtime only -- if she cannot sleep  Wants to keep her zoloft dose where it is   Patient Active Problem List  Diagnoses  . Hyperlipidemia  . Hypertension  . GERD (gastroesophageal reflux disease)  . Routine general medical examination at a health care facility  . Vitamin D deficiency  . Osteopenia  . Sleep apnea  . Anxiety  . Allergic rhinitis  . Rosacea  . Other screening mammogram   Past Medical History  Diagnosis Date  . History of chicken pox   . Depression   . Bronchitis   . GERD (gastroesophageal reflux disease)   . Allergy   . Heart murmur   . Hypertension   . Hyperlipidemia   . Migraines   . Colon polyps   . Hx: UTI (urinary tract infection)   . Sleep apnea     uses cpap every night   . Rosacea    No past surgical history on file. History  Substance Use Topics  . Smoking status: Never Smoker   . Smokeless tobacco: Not on file  . Alcohol Use: No   Family History  Problem Relation Age of Onset  . Heart disease Mother   . Heart disease Father   . Hypertension Father   . Stroke Paternal Grandmother   . Heart disease Paternal Grandfather   . Hypertension Paternal Grandfather   . Diabetes Other   . Breast cancer Paternal Aunt    No Known Allergies Current Outpatient Prescriptions on File Prior to Visit  Medication Sig Dispense Refill  . ALPRAZolam (XANAX) 0.5 MG tablet Take 1 tablet  (0.5 mg total) by mouth 3 (three) times daily as needed.  90 tablet  0  . aspirin 81 MG tablet Take 81 mg by mouth daily.        . Calcium Carbonate-Vitamin D 600-400 MG-UNIT per tablet Take 1 tablet by mouth daily.       Marland Kitchen docusate sodium (COLACE) 100 MG capsule Take 100 mg by mouth daily.        Marland Kitchen esomeprazole (NEXIUM) 40 MG capsule Take 1 capsule (40 mg total) by mouth daily before breakfast.  30 capsule  11  . lisinopril-hydrochlorothiazide (PRINZIDE,ZESTORETIC) 20-25 MG per tablet Take 1 tablet by mouth daily.        . metroNIDAZOLE (METROGEL) 1 % gel Apply topically to face at bedtime       . propranolol (INDERAL) 10 MG tablet TAKE 1 TABLET EVERY 8 HOURS  90 tablet  11  . rosuvastatin (CRESTOR) 10 MG tablet Take 1 tablet (10 mg total) by mouth daily.  30 tablet  3  . sertraline (ZOLOFT) 50 MG tablet Take 1 tablet (50 mg total) by mouth daily. In evening  30 tablet  11  .  fexofenadine (ALLEGRA) 180 MG tablet Take 180 mg by mouth daily.               Review of Systems Review of Systems  Constitutional: Negative for fever, appetite change, fatigue and unexpected weight change.  Eyes: Negative for pain and visual disturbance.  Respiratory: Negative for cough and shortness of breath.   Cardiovascular: Negative for cp or palpitations    Gastrointestinal: Negative for nausea, diarrhea and constipation.  Genitourinary: Negative for urgency and frequency.  Skin: Negative for pallor or rash   MSK pos for pain in foot Neurological: Negative for weakness, light-headedness, numbness and headaches.  Hematological: Negative for adenopathy. Does not bruise/bleed easily.  Psychiatric/Behavioral: Negative for dysphoric mood. The patient is anxious but that is much improved        Objective:   Physical Exam  Constitutional: She appears well-developed and well-nourished. No distress.  HENT:  Head: Normocephalic and atraumatic.  Mouth/Throat: Oropharynx is clear and moist.  Eyes:  Conjunctivae and EOM are normal. Pupils are equal, round, and reactive to light.  Neck: Normal range of motion. Neck supple. No thyromegaly present.  Cardiovascular: Normal rate, regular rhythm, normal heart sounds and intact distal pulses.   Pulmonary/Chest: Effort normal and breath sounds normal. No respiratory distress. She has no wheezes.  Lymphadenopathy:    She has no cervical adenopathy.  Neurological: She is alert. She has normal reflexes. She displays no tremor. She exhibits normal muscle tone. Coordination normal.  Skin: Skin is warm and dry. No rash noted. No erythema. No pallor.  Psychiatric:       Improved/ brighter affect - not seemingly anxious  Nl eye contact and communication skills          Assessment & Plan:

## 2011-07-23 NOTE — Assessment & Plan Note (Signed)
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

## 2011-07-24 ENCOUNTER — Ambulatory Visit: Payer: BC Managed Care – PPO | Admitting: Family Medicine

## 2011-08-22 ENCOUNTER — Ambulatory Visit: Payer: BC Managed Care – PPO | Admitting: Family Medicine

## 2011-09-09 ENCOUNTER — Other Ambulatory Visit: Payer: Self-pay

## 2011-09-09 MED ORDER — ROSUVASTATIN CALCIUM 10 MG PO TABS: 10.0000 mg | ORAL_TABLET | Freq: Every day | ORAL | Status: DC

## 2011-09-09 NOTE — Telephone Encounter (Signed)
[facility] [address] [facility] ref[facility] Crestor 10 mg #30 x 6.

## 2011-09-16 ENCOUNTER — Other Ambulatory Visit: Payer: Self-pay | Admitting: *Deleted

## 2011-09-16 MED ORDER — ALPRAZOLAM 0.5 MG PO TABS: 0.5000 mg | ORAL_TABLET | Freq: Three times a day (TID) | ORAL | Status: DC | PRN

## 2011-09-16 NOTE — Telephone Encounter (Signed)
Medication phoned to [facility] [address] [facility] as instructed.

## 2011-09-16 NOTE — Telephone Encounter (Signed)
Px written for call in   

## 2011-10-08 ENCOUNTER — Other Ambulatory Visit: Payer: Self-pay | Admitting: Family Medicine

## 2011-10-08 MED ORDER — ATORVASTATIN CALCIUM 10 MG PO TABS: 10.0000 mg | ORAL_TABLET | Freq: Every day | ORAL | Status: DC

## 2011-10-08 MED ORDER — ESOMEPRAZOLE MAGNESIUM 40 MG PO CPDR: 40.0000 mg | DELAYED_RELEASE_CAPSULE | Freq: Every day | ORAL | Status: DC

## 2011-10-08 NOTE — Telephone Encounter (Signed)
I will refil them both electronically- and sign for generic if availible

## 2011-10-08 NOTE — Telephone Encounter (Signed)
Pt is calling and would like a switch from Lipitor and Nexium to the generic brand for both if possible and they need a refill. Her insurance has changed.Please call [phone] if there are any questions. [facility] [address] [facility] [place].

## 2011-10-08 NOTE — Telephone Encounter (Signed)
Patient's husband notified as instructed by telephone. Pt husband said he talked with Dr Alphonsus Sias and he put the request in today about generic.

## 2011-10-14 ENCOUNTER — Telehealth: Payer: Self-pay | Admitting: Family Medicine

## 2011-10-14 MED ORDER — OMEPRAZOLE 20 MG PO CPDR: 20.0000 mg | DELAYED_RELEASE_CAPSULE | Freq: Every day | ORAL | Status: DC

## 2011-10-14 NOTE — Telephone Encounter (Signed)
Patient notified script sent 

## 2011-10-14 NOTE — Telephone Encounter (Signed)
[address] [place], [place] [address] p. [phone] f. [phone] To: [facility] (Daytime Triage) Fax: [phone] From: [facility] Date/ Time: 10/14/2011 10:21 AM Taken By: Tomasita Crumble, RN Caller: Windy Fast Facility: not collected Patient: Adriana Marks, Adriana Marks DOB: 15-Dec-1945 Phone: [phone] Reason for Call: Pt. Has been on Nexium; insurance changed and it is no longer covered; wants to get Omeprazole, instead. Requests Rx. For same. States called office last week on 10/08/11 and [facility]; no response as yet per caller. Advised Omeprazole now OTC and may not be covered by insurance, but will send note requesting same and it may take 3 days or more to get a response. Caller would like to be called with response to this. Regarding Appointment: Appt Date: Appt Time: Unknown Provider: Reason: Details: confidential Outcome:

## 2011-10-14 NOTE — Telephone Encounter (Signed)
[facility]

## 2011-11-08 ENCOUNTER — Telehealth: Payer: Self-pay | Admitting: Family Medicine

## 2011-11-08 NOTE — Telephone Encounter (Signed)
Pt is not available and husband said he was on pts chart to release medical information. I spoke with Aram Beecham and she said since the weekend to tell pt waht pt needs to know until she can call back next week. I told pts husband that pt can continue the medication and to please call back next week and I can speak with pt in more detail. Pts husband said he does not want pt on that medicine and he wants to know what Dr Milinda Antis said. I once again asked him to have pt call back next week and I will go into more detail with pt but for now pt can continue the medication. Mr Forge was getting more upset and I asked him to hold and called Jamesetta So our Print production planner and she will speak with Mr Sprankle.

## 2011-11-08 NOTE — Telephone Encounter (Signed)
Let her know that is a class effect of all stains - not just lipitor / also for others like zocor  I think at this time the benefits outweigh the risks and would not recommend stopping it unless we see a big rise in blood sugar over time  Of course - it is ultimately up to her whether she wants to continue the med

## 2011-11-08 NOTE — Telephone Encounter (Signed)
[facility] Triage Call Report Triage Record Num: [id number] Operator: Chevis Pretty Patient Name: Adriana Marks Call Date & Time: 11/08/2011 3:50:38PM Patient Phone: [phone] PCP: Patient Gender: Female PCP Fax : Patient DOB: [birth year] Practice Name: [facility] Reason for Call: Caller: Ronald/Spouse; PCP: Roxy Manns A.; CB#: [phone]; ; ; Call regarding Medication Question; states has seen on TV where Lipitor can cause diabetes in women; would like simvastatin called in for her so she can stop the lipitor. OFFICE NOTE FOR MD REVIEW/RX/CALLBACK. [facility]ES [facility] [place]. MAY REACH PATIENT OR SPOUSE AT [phone]. Protocol(s) Used: Medication Questions - Adult Recommended Outcome per Protocol: Call Dispensing Pharmacy or Provider Immediately Reason for Outcome: Requests refill of medication that poses clinical risk to patient if not available within 8 hours Care Advice: ~ Have pharmacy phone number and prescription information available when you speak with provider. If valid refills are available, it will be noted on original container. If container no longer available, prescribing pharmacy will have a record if refills are available. ~ ~ Ask pharmacist if they can do a partial refill. 03/

## 2011-11-11 MED ORDER — SIMVASTATIN 20 MG PO TABS: 20.0000 mg | ORAL_TABLET | Freq: Every day | ORAL | Status: DC

## 2011-11-11 NOTE — Telephone Encounter (Signed)
Schedule fasting lipids/ast/alt 272 in 6 weeks please

## 2011-11-11 NOTE — Telephone Encounter (Signed)
Called pt left message to call to schedule labs..[facility] 11-11-2011

## 2011-11-11 NOTE — Telephone Encounter (Signed)
Patient notified as instructed by telephone. Pt said she does want to stop lipitor now and start on simvastatin. [facility] uses [facility]. Pt said she cannot answer phone when at work so call home # and leave message on answering machine and if a f/u lab appt is needed she will call back and schedule after she gets message.

## 2011-11-18 ENCOUNTER — Other Ambulatory Visit: Payer: Self-pay | Admitting: Family Medicine

## 2011-12-09 ENCOUNTER — Other Ambulatory Visit: Payer: Self-pay | Admitting: *Deleted

## 2011-12-09 MED ORDER — ALPRAZOLAM 0.5 MG PO TABS: 0.5000 mg | ORAL_TABLET | Freq: Three times a day (TID) | ORAL | Status: DC | PRN

## 2011-12-09 NOTE — Telephone Encounter (Signed)
Px written for call in   

## 2011-12-09 NOTE — Telephone Encounter (Signed)
Received faxed refill request from pharmacy. Is it okay to refill medication? 

## 2011-12-10 ENCOUNTER — Other Ambulatory Visit: Payer: Self-pay | Admitting: *Deleted

## 2011-12-10 NOTE — Telephone Encounter (Signed)
[facility]

## 2011-12-10 NOTE — Telephone Encounter (Signed)
I did this yesterday. 

## 2011-12-11 NOTE — Telephone Encounter (Signed)
Rx for Alprazolam denied, it was called to pharmacy on 12/09/2011.

## 2012-02-18 ENCOUNTER — Telehealth: Payer: Self-pay | Admitting: Family Medicine

## 2012-02-18 DIAGNOSIS — E785 Hyperlipidemia, unspecified: Secondary | ICD-10-CM

## 2012-02-18 NOTE — Telephone Encounter (Signed)
I will do mammo ref  Go ahead and put her in for a PE in an 30 min slot the week she requested -thanks

## 2012-02-18 NOTE — Telephone Encounter (Signed)
Pt is due for her Mammo at [facility] and wanted to know if we could go ahead and put an order in for that. She also is due in October for a CPE and is off the week of the 14th. She was wondering if she could have her CPE done that week as well. I wanted to ask to make sure that would be ok.

## 2012-05-09 ENCOUNTER — Other Ambulatory Visit: Payer: Self-pay | Admitting: Family Medicine

## 2012-05-11 ENCOUNTER — Other Ambulatory Visit: Payer: Self-pay | Admitting: *Deleted

## 2012-05-11 MED ORDER — PROPRANOLOL HCL 10 MG PO TABS: 10.0000 mg | ORAL_TABLET | Freq: Three times a day (TID) | ORAL | Status: DC

## 2012-05-15 ENCOUNTER — Emergency Department: Payer: Self-pay | Admitting: Emergency Medicine

## 2012-05-24 ENCOUNTER — Other Ambulatory Visit: Payer: Self-pay | Admitting: Family Medicine

## 2012-05-25 NOTE — Telephone Encounter (Signed)
Please refil this for the year

## 2012-05-25 NOTE — Telephone Encounter (Signed)
Ok to refil this for the year, thanks

## 2012-05-25 NOTE — Telephone Encounter (Signed)
Ok to refill 

## 2012-06-02 ENCOUNTER — Other Ambulatory Visit: Payer: BC Managed Care – PPO

## 2012-06-08 ENCOUNTER — Encounter: Payer: BC Managed Care – PPO | Admitting: Family Medicine

## 2012-08-04 ENCOUNTER — Telehealth: Payer: Self-pay | Admitting: Family Medicine

## 2012-08-04 DIAGNOSIS — I10 Essential (primary) hypertension: Secondary | ICD-10-CM

## 2012-08-04 DIAGNOSIS — M8580 Other specified disorders of bone density and structure, unspecified site: Secondary | ICD-10-CM

## 2012-08-04 DIAGNOSIS — Z0000 Encounter for general adult medical examination without abnormal findings: Secondary | ICD-10-CM

## 2012-08-04 DIAGNOSIS — E785 Hyperlipidemia, unspecified: Secondary | ICD-10-CM

## 2012-08-04 NOTE — Telephone Encounter (Signed)
Message copied by [name] on Tue Aug 04, 2012 10:17 PM ------      Message from: Baldomero Lamy      Created: Wed Jul 29, 2012  2:54 PM      Regarding: Cpx labs Wed 08/05/12       Please order  future cpx labs for pt's upcomming lab appt.      Thanks      Rodney Booze

## 2012-08-05 ENCOUNTER — Other Ambulatory Visit (INDEPENDENT_AMBULATORY_CARE_PROVIDER_SITE_OTHER): Payer: BC Managed Care – PPO

## 2012-08-05 DIAGNOSIS — M8580 Other specified disorders of bone density and structure, unspecified site: Secondary | ICD-10-CM

## 2012-08-05 DIAGNOSIS — E785 Hyperlipidemia, unspecified: Secondary | ICD-10-CM

## 2012-08-05 DIAGNOSIS — Z0000 Encounter for general adult medical examination without abnormal findings: Secondary | ICD-10-CM

## 2012-08-05 LAB — CBC WITH DIFFERENTIAL/PLATELET
Basophils Absolute: 0 10*3/uL (ref 0.0–0.1)
Basophils Relative: 0.5 % (ref 0.0–3.0)
Eosinophils Absolute: 0.3 10*3/uL (ref 0.0–0.7)
Eosinophils Relative: 4 % (ref 0.0–5.0)
HCT: 39.5 % (ref 36.0–46.0)
Hemoglobin: 13.4 g/dL (ref 12.0–15.0)
Lymphocytes Relative: 32.3 % (ref 12.0–46.0)
Lymphs Abs: 2.1 10*3/uL (ref 0.7–4.0)
MCHC: 33.8 g/dL (ref 30.0–36.0)
MCV: 93 fl (ref 78.0–100.0)
Monocytes Absolute: 0.6 10*3/uL (ref 0.1–1.0)
Monocytes Relative: 9.6 % (ref 3.0–12.0)
Neutro Abs: 3.5 10*3/uL (ref 1.4–7.7)
Neutrophils Relative %: 53.6 % (ref 43.0–77.0)
Platelets: 239 10*3/uL (ref 150.0–400.0)
RBC: 4.25 Mil/uL (ref 3.87–5.11)
RDW: 13 % (ref 11.5–14.6)
WBC: 6.6 10*3/uL (ref 4.5–10.5)

## 2012-08-05 LAB — COMPREHENSIVE METABOLIC PANEL
ALT: 18 U/L (ref 0–35)
AST: 19 U/L (ref 0–37)
Albumin: 4 g/dL (ref 3.5–5.2)
Alkaline Phosphatase: 51 U/L (ref 39–117)
BUN: 13 mg/dL (ref 6–23)
CO2: 32 mEq/L (ref 19–32)
Calcium: 9.3 mg/dL (ref 8.4–10.5)
Chloride: 104 mEq/L (ref 96–112)
Creatinine, Ser: 0.7 mg/dL (ref 0.4–1.2)
GFR: 89.36 mL/min (ref >60.00)
Glucose, Bld: 112 mg/dL — ABNORMAL HIGH (ref 70–99)
Potassium: 4.1 mEq/L (ref 3.5–5.1)
Sodium: 142 mEq/L (ref 135–145)
Total Bilirubin: 0.7 mg/dL (ref 0.3–1.2)
Total Protein: 7.4 g/dL (ref 6.0–8.3)

## 2012-08-05 LAB — LIPID PANEL
Cholesterol: 171 mg/dL (ref 0–200)
HDL: 46 mg/dL (ref >39.00)
LDL Cholesterol: 107 mg/dL — ABNORMAL HIGH (ref 0–99)
Total CHOL/HDL Ratio: 4
Triglycerides: 88 mg/dL (ref 0.0–149.0)
VLDL: 17.6 mg/dL (ref 0.0–40.0)

## 2012-08-05 LAB — TSH: TSH: 2.04 u[IU]/mL (ref 0.35–5.50)

## 2012-08-06 LAB — VITAMIN D 25 HYDROXY (VIT D DEFICIENCY, FRACTURES): Vit D, 25-Hydroxy: 18 ng/mL — ABNORMAL LOW (ref 30–89)

## 2012-08-12 ENCOUNTER — Ambulatory Visit: Payer: Self-pay | Admitting: Family Medicine

## 2012-08-12 ENCOUNTER — Ambulatory Visit (INDEPENDENT_AMBULATORY_CARE_PROVIDER_SITE_OTHER): Payer: BC Managed Care – PPO | Admitting: Family Medicine

## 2012-08-12 ENCOUNTER — Encounter: Payer: Self-pay | Admitting: Family Medicine

## 2012-08-12 VITALS — BP 140/82 | HR 64 | Temp 98.0°F | Ht 62.5 in | Wt 178.5 lb

## 2012-08-12 DIAGNOSIS — R7309 Other abnormal glucose: Secondary | ICD-10-CM | POA: Insufficient documentation

## 2012-08-12 DIAGNOSIS — M8580 Other specified disorders of bone density and structure, unspecified site: Secondary | ICD-10-CM

## 2012-08-12 DIAGNOSIS — K219 Gastro-esophageal reflux disease without esophagitis: Secondary | ICD-10-CM

## 2012-08-12 DIAGNOSIS — E559 Vitamin D deficiency, unspecified: Secondary | ICD-10-CM

## 2012-08-12 DIAGNOSIS — I10 Essential (primary) hypertension: Secondary | ICD-10-CM

## 2012-08-12 DIAGNOSIS — E785 Hyperlipidemia, unspecified: Secondary | ICD-10-CM

## 2012-08-12 DIAGNOSIS — Z1231 Encounter for screening mammogram for malignant neoplasm of breast: Secondary | ICD-10-CM

## 2012-08-12 DIAGNOSIS — R7303 Prediabetes: Secondary | ICD-10-CM | POA: Insufficient documentation

## 2012-08-12 DIAGNOSIS — Z0000 Encounter for general adult medical examination without abnormal findings: Secondary | ICD-10-CM

## 2012-08-12 DIAGNOSIS — M899 Disorder of bone, unspecified: Secondary | ICD-10-CM

## 2012-08-12 DIAGNOSIS — R739 Hyperglycemia, unspecified: Secondary | ICD-10-CM

## 2012-08-12 MED ORDER — OMEPRAZOLE 20 MG PO CPDR: 20.0000 mg | DELAYED_RELEASE_CAPSULE | Freq: Two times a day (BID) | ORAL | Status: DC

## 2012-08-12 MED ORDER — ERGOCALCIFEROL 1.25 MG (50000 UT) PO CAPS: 50000.0000 [IU] | ORAL_CAPSULE | ORAL | Status: DC

## 2012-08-12 NOTE — Assessment & Plan Note (Signed)
Reviewed health habits including diet and exercise and skin cancer prevention Also reviewed health mt list, fam hx and immunizations  Wellness labs reviewed Will look into coverage of shingles vaccine

## 2012-08-12 NOTE — Progress Notes (Signed)
Subjective:    Patient ID: ITA FRITZSCHE, female    DOB: 09-07-1945, 66 y.o.   MRN: [id number]  HPI Here for health maintenance exam and to review chronic medical problems    Has been doing pretty well overall  New job position is very very stressful - but retiring in April- very excited about that  Would like to wearn off zoloft after that   Zoster status=- is interested and needs to check with her insurance co    Flu vaccine- had that in oct   Mammogram 10/12- has it scheduled this afternoon  Self exam -no lumps or changes   Pap 9/11 No symptoms and no new partners  No hx of recent abn paps    colonosc 12/10  bp is stable today  No cp or palpitations or headaches or edema  No side effects to medicines  BP Readings from Last 3 Encounters:  08/12/12 140/82  07/23/11 138/86  06/10/11 140/78    Is very stressed -thinks this inc her bp    Wt is up 2 lb with bmi of 32 Is trying to eat more fruit and vegetables  Gets on her exercise bike and sometimes walks - this will get even better after retirement    Glucose was 112 This happened about 10 years ago - and she corrected it with diet    Osteopenia dexa 8/09- last one was 2011/or2012 and it was fine - will send for that  Vit D 18- still low - is not taking any  No fractures  She did have one fall at work in the parking lot- tripped when she was running -she went to ER [facility]- head scan was normal   Lab Results  Component Value Date   CHOL 171 08/05/2012   CHOL 142 03/06/2011   Lab Results  Component Value Date   HDL 46.00 08/05/2012   HDL 55.50 03/06/2011   Lab Results  Component Value Date   LDLCALC 107* 08/05/2012   LDLCALC 79 03/06/2011   Lab Results  Component Value Date   TRIG 88.0 08/05/2012   TRIG 39.0 03/06/2011   Lab Results  Component Value Date   CHOLHDL 4 08/05/2012   CHOLHDL 3 03/06/2011   No results found for this basename: LDLDIRECT   on zocor and diet  This is up a bit  Will watch  diet more closely   GERD- gets heartburn at night at least 2-3 times per week since changing from nexium to prilosec Needs to take it bid   Patient Active Problem List  Diagnosis  . Hyperlipidemia  . Hypertension  . GERD (gastroesophageal reflux disease)  . Routine general medical examination at a health care facility  . Vitamin D deficiency disease  . Osteopenia  . Sleep apnea  . Anxiety  . Allergic rhinitis  . Rosacea  . Other screening mammogram   Past Medical History  Diagnosis Date  . History of chicken pox   . Depression   . Bronchitis   . GERD (gastroesophageal reflux disease)   . Allergy   . Heart murmur   . Hypertension   . Hyperlipidemia   . Migraines   . Colon polyps   . Hx: UTI (urinary tract infection)   . Sleep apnea     uses cpap every night   . Rosacea    No past surgical history on file. History  Substance Use Topics  . Smoking status: Never Smoker   . Smokeless tobacco: Not on  file  . Alcohol Use: Yes     Comment: wine occasional   Family History  Problem Relation Age of Onset  . Heart disease Mother   . Heart disease Father   . Hypertension Father   . Stroke Paternal Grandmother   . Heart disease Paternal Grandfather   . Hypertension Paternal Grandfather   . Diabetes Other   . Breast cancer Paternal Aunt    No Known Allergies Current Outpatient Prescriptions on File Prior to Visit  Medication Sig Dispense Refill  . ALPRAZolam (XANAX) 0.5 MG tablet Take 1 tablet (0.5 mg total) by mouth 3 (three) times daily as needed.  90 tablet  2  . aspirin 81 MG tablet Take 81 mg by mouth daily.        . Calcium Carbonate-Vitamin D 600-400 MG-UNIT per tablet Take 1 tablet by mouth daily.       Marland Kitchen docusate sodium (COLACE) 100 MG capsule Take 100 mg by mouth daily.       Marland Kitchen lisinopril-hydrochlorothiazide (PRINZIDE,ZESTORETIC) 20-25 MG per tablet Take 1 tablet by mouth daily.  90 tablet  3  . loratadine (CLARITIN) 10 MG tablet Take 10 mg by mouth daily.         . metroNIDAZOLE (METROGEL) 1 % gel Apply topically to face at bedtime       . omeprazole (PRILOSEC) 20 MG capsule Take 1 capsule (20 mg total) by mouth daily.  30 capsule  11  . propranolol (INDERAL) 10 MG tablet Take 10 mg by mouth 2 (two) times daily.      . sertraline (ZOLOFT) 50 MG tablet TAKE 1 TABLET (50 MG TOTAL) BY MOUTH DAILY. IN EVENING  30 tablet  11  . simvastatin (ZOCOR) 20 MG tablet Take 1 tablet (20 mg total) by mouth at bedtime.  30 tablet  11  . [DISCONTINUED] atorvastatin (LIPITOR) 10 MG tablet Take 1 tablet (10 mg total) by mouth daily.  30 tablet  11    Review of Systems Review of Systems  Constitutional: Negative for fever, appetite change, fatigue and unexpected weight change.  Eyes: Negative for pain and visual disturbance.  Respiratory: Negative for cough and shortness of breath.   Cardiovascular: Negative for cp or palpitations    Gastrointestinal: Negative for nausea, diarrhea and constipation. pos for heartburn, neg for abd pain Genitourinary: Negative for urgency and frequency. neg for inc thirst  Skin: Negative for pallor or rash   Neurological: Negative for weakness, light-headedness, numbness and headaches.  Hematological: Negative for adenopathy. Does not bruise/bleed easily.  Psychiatric/Behavioral: Negative for dysphoric mood. The patient is not nervous/anxious.  pos for stressors        Objective:   Physical Exam  Constitutional: She appears well-developed and well-nourished. No distress.  HENT:  Head: Normocephalic and atraumatic.  Right Ear: External ear normal.  Left Ear: External ear normal.  Nose: Nose normal.  Mouth/Throat: Oropharynx is clear and moist. No oropharyngeal exudate.  Eyes: Conjunctivae normal and EOM are normal. Pupils are equal, round, and reactive to light. Right eye exhibits no discharge. Left eye exhibits no discharge. No scleral icterus.  Neck: Normal range of motion. Neck supple. No JVD present. Carotid bruit is not  present. No thyromegaly present.  Cardiovascular: Normal rate, regular rhythm, normal heart sounds and intact distal pulses.  Exam reveals no gallop.   Pulmonary/Chest: Effort normal and breath sounds normal. No respiratory distress. She has no wheezes. She exhibits no tenderness.  Abdominal: Soft. Bowel sounds are normal. She  exhibits no distension, no abdominal bruit and no mass. There is no tenderness.  Genitourinary: No breast swelling, tenderness, discharge or bleeding.       Breast exam: No mass, nodules, thickening, tenderness, bulging, retraction, inflamation, nipple discharge or skin changes noted.  No axillary or clavicular LA.  Chaperoned exam.    Musculoskeletal: She exhibits no edema and no tenderness.  Lymphadenopathy:    She has no cervical adenopathy.  Neurological: She is alert. She has normal reflexes. No cranial nerve deficit. She exhibits normal muscle tone. Coordination normal.  Skin: Skin is warm and dry. No rash noted. No erythema. No pallor.  Psychiatric: She has a normal mood and affect.          Assessment & Plan:

## 2012-08-12 NOTE — Patient Instructions (Addendum)
Keep an eye on your blood pressure If you are interested in a shingles/zoster vaccine - call your insurance to check on coverage,( you should not get it within 1 month of other vaccines) , then call us for a prescription  for it to take to a pharmacy that gives the shot , or make a nurse visit to get it here depending on your coverage Increase your omeprazole to twice daily and let me know if acid reflux does not improve Please send for last bone density report Get vitamin D 2000 iu over the counter and take one daily In addition take weekly vitamin D px for 12 weeks Schedule non fasting labs in 12 weeks then follow up  Watch sugar in diet  Avoid red meat/ fried foods/ egg yolks/ fatty breakfast meats/ butter, cheese and high fat dairy/ and shellfish

## 2012-08-12 NOTE — Assessment & Plan Note (Signed)
Will start 2000 iu daily In addition- 50,000 iu once weekly for 12 weeks then lab and f/u  Disc imp to bone and overall health

## 2012-08-12 NOTE — Assessment & Plan Note (Signed)
Up a bit Disc goals for lipids and reasons to control them Rev labs with pt Rev low sat fat diet in detail  Will continue zocor and work harder on diet

## 2012-08-12 NOTE — Assessment & Plan Note (Signed)
Send for last dexa Will replace vit D and re check 3 mo  Disc imp of exercise and fall prev

## 2012-08-12 NOTE — Assessment & Plan Note (Signed)
This is new Disc low glycemic diet/ exercise for wt loss a1c 3 mo and f/u

## 2012-08-12 NOTE — Assessment & Plan Note (Signed)
mammo is scheduled today Nl breast exam

## 2012-08-12 NOTE — Assessment & Plan Note (Signed)
bp is up a bit  Rev lifestyle habit Re check at 3 mo f/u and will see if we need to take action Disc need for exercise and wt loss

## 2012-08-13 ENCOUNTER — Telehealth: Payer: Self-pay

## 2012-08-13 ENCOUNTER — Encounter: Payer: Self-pay | Admitting: Family Medicine

## 2012-08-13 MED ORDER — ZOSTER VACCINE LIVE 19400 UNT/0.65ML ~~LOC~~ SOLR: 0.6500 mL | Freq: Once | SUBCUTANEOUS | Status: DC

## 2012-08-13 NOTE — Telephone Encounter (Signed)
Pt notified Rx ready for pickup 

## 2012-08-13 NOTE — Telephone Encounter (Signed)
Printed.  Thanks.  

## 2012-08-13 NOTE — Telephone Encounter (Signed)
pts husband said Adriana Marks's insurance pays 100 % for shingles vaccine and request written rx to be picked up today; Adriana Marks is not working today and wants to get vaccine done. Adriana Marks saw Dr Milinda Antis 08/12/12.Please advise.

## 2012-08-16 NOTE — Assessment & Plan Note (Signed)
Worse control lately  Inc her PPI to bid and see if this helps If not -may need to change med or ref to GI Disc diet for gerd in detail

## 2012-08-17 ENCOUNTER — Encounter: Payer: Self-pay | Admitting: *Deleted

## 2012-08-23 ENCOUNTER — Other Ambulatory Visit: Payer: Self-pay | Admitting: Family Medicine

## 2012-08-24 NOTE — Telephone Encounter (Signed)
Px written for call in   

## 2012-08-24 NOTE — Telephone Encounter (Signed)
Rx called in as prescribed 

## 2012-08-24 NOTE — Telephone Encounter (Signed)
Ok to refill 

## 2012-08-29 ENCOUNTER — Other Ambulatory Visit: Payer: Self-pay | Admitting: Family Medicine

## 2012-08-31 NOTE — Telephone Encounter (Signed)
Was this just done on 12/30?

## 2012-08-31 NOTE — Telephone Encounter (Signed)
Ok to refill 

## 2012-08-31 NOTE — Telephone Encounter (Signed)
Check with pharm. and they didn't receive the Rx called in on 08/24/12 so called in Rx again as prescribed to pharmacist and declined this request

## 2012-09-10 ENCOUNTER — Other Ambulatory Visit: Payer: BC Managed Care – PPO

## 2012-09-15 ENCOUNTER — Encounter: Payer: BC Managed Care – PPO | Admitting: Family Medicine

## 2012-10-19 ENCOUNTER — Other Ambulatory Visit: Payer: Self-pay | Admitting: Family Medicine

## 2012-11-04 ENCOUNTER — Other Ambulatory Visit (INDEPENDENT_AMBULATORY_CARE_PROVIDER_SITE_OTHER): Payer: BC Managed Care – PPO

## 2012-11-04 DIAGNOSIS — R739 Hyperglycemia, unspecified: Secondary | ICD-10-CM

## 2012-11-04 DIAGNOSIS — E559 Vitamin D deficiency, unspecified: Secondary | ICD-10-CM

## 2012-11-04 DIAGNOSIS — R7309 Other abnormal glucose: Secondary | ICD-10-CM

## 2012-11-04 LAB — HEMOGLOBIN A1C: Hgb A1c MFr Bld: 5.7 % (ref 4.6–6.5)

## 2012-11-05 LAB — VITAMIN D 25 HYDROXY (VIT D DEFICIENCY, FRACTURES): Vit D, 25-Hydroxy: 45 ng/mL (ref 30–89)

## 2012-11-07 ENCOUNTER — Other Ambulatory Visit: Payer: Self-pay | Admitting: Family Medicine

## 2012-11-11 ENCOUNTER — Ambulatory Visit: Payer: BC Managed Care – PPO | Admitting: Family Medicine

## 2012-11-27 ENCOUNTER — Telehealth: Payer: Self-pay

## 2012-11-27 ENCOUNTER — Other Ambulatory Visit: Payer: Self-pay

## 2012-11-27 MED ORDER — LISINOPRIL-HYDROCHLOROTHIAZIDE 20-25 MG PO TABS: ORAL_TABLET | ORAL | Status: DC

## 2012-11-27 MED ORDER — OMEPRAZOLE 20 MG PO CPDR: 20.0000 mg | DELAYED_RELEASE_CAPSULE | Freq: Two times a day (BID) | ORAL | Status: DC

## 2012-11-27 MED ORDER — PROPRANOLOL HCL 10 MG PO TABS: ORAL_TABLET | ORAL | Status: DC

## 2012-11-27 MED ORDER — SIMVASTATIN 20 MG PO TABS: ORAL_TABLET | ORAL | Status: DC

## 2012-11-27 NOTE — Telephone Encounter (Signed)
Pt has medication but due to insurance will need next refills thru [facility] mail order pharmacy. Pt has appt 12/29/12 and should have med to last until appt on 12/29/12; if not pt will call back.

## 2012-11-27 NOTE — Telephone Encounter (Signed)
Pt husband wants lisinopril-HCTZ,omprazole, propranolol and simvastatin sent to [facility]. Pt has appt 12/29/12 with Dr Milinda Antis and pts husband said need med refilled for 3 months thru [facility] and it will cost pt 0 dollars. Pt is almost out of med and does not want refilled locally because will have to pay out of pocket. Advised pt done.

## 2012-12-29 ENCOUNTER — Encounter: Payer: Self-pay | Admitting: Family Medicine

## 2012-12-29 ENCOUNTER — Ambulatory Visit (INDEPENDENT_AMBULATORY_CARE_PROVIDER_SITE_OTHER): Payer: Medicare Other | Admitting: Family Medicine

## 2012-12-29 VITALS — BP 132/80 | HR 76 | Temp 98.8°F | Ht 62.5 in | Wt 179.5 lb

## 2012-12-29 DIAGNOSIS — I10 Essential (primary) hypertension: Secondary | ICD-10-CM

## 2012-12-29 DIAGNOSIS — F411 Generalized anxiety disorder: Secondary | ICD-10-CM

## 2012-12-29 DIAGNOSIS — F419 Anxiety disorder, unspecified: Secondary | ICD-10-CM

## 2012-12-29 DIAGNOSIS — G4730 Sleep apnea, unspecified: Secondary | ICD-10-CM

## 2012-12-29 MED ORDER — LISINOPRIL-HYDROCHLOROTHIAZIDE 20-25 MG PO TABS: ORAL_TABLET | ORAL | Status: DC

## 2012-12-29 MED ORDER — OMEPRAZOLE 20 MG PO CPDR: 20.0000 mg | DELAYED_RELEASE_CAPSULE | Freq: Two times a day (BID) | ORAL | Status: DC

## 2012-12-29 MED ORDER — SIMVASTATIN 20 MG PO TABS: ORAL_TABLET | ORAL | Status: DC

## 2012-12-29 MED ORDER — SERTRALINE HCL 50 MG PO TABS: 50.0000 mg | ORAL_TABLET | Freq: Every day | ORAL | Status: DC

## 2012-12-29 NOTE — Assessment & Plan Note (Signed)
Doing well  Sent zoloft px to her mail order pharmacy Expect retirement will help

## 2012-12-29 NOTE — Assessment & Plan Note (Signed)
Doing well but she tends to get a slump after her inderal in the am  Will change that dose to pm and continue to watch  F/u august bp better on 2nd check

## 2012-12-29 NOTE — Patient Instructions (Addendum)
Change your propranolol to night time dosing and see if that makes you feel better  (skip it tomorrow am and then just take it at night) I sent your sertaline to the mail order pharmacy

## 2012-12-29 NOTE — Progress Notes (Signed)
Subjective:    Patient ID: Adriana Marks, female    DOB: 1945/09/30, 66 y.o.   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 If that is enough - she has to get up very very early   She takes omeprazole and lisinopril hct and propranolol   bp is stable today  No cp or palpitations or headaches or edema  No side effects to medicines  BP Readings from Last 3 Encounters:  12/29/12 146/82  08/12/12 140/82  07/23/11 138/86     Wt is stable with bmi of 32    Patient Active Problem List   Diagnosis Date Noted  . Hyperglycemia 08/12/2012  . Other screening mammogram 03/22/2011  . Hyperlipidemia 01/01/2011  . Hypertension 01/01/2011  . GERD (gastroesophageal reflux disease) 01/01/2011  . Routine general medical examination at a health care facility 01/01/2011  . Vitamin D deficiency disease 01/01/2011  . Osteopenia 01/01/2011  . Sleep apnea 01/01/2011  . Anxiety 01/01/2011  . Allergic rhinitis 01/01/2011  . Rosacea 01/01/2011   Past Medical History  Diagnosis Date  . History of chicken pox   . Depression   . Bronchitis   . GERD (gastroesophageal reflux disease)   . Allergy   . Heart murmur   . Hypertension   . Hyperlipidemia   . Migraines   . Colon polyps   . Hx: UTI (urinary tract infection)   . Sleep apnea     uses cpap every night   . Rosacea    No past surgical history on file. History  Substance Use Topics  . Smoking status: Never Smoker   . Smokeless tobacco: Not on file  . Alcohol Use: Yes     Comment: wine occasional   Family History  Problem Relation Age of Onset  . Heart disease  Mother   . Heart disease Father   . Hypertension Father   . Stroke Paternal Grandmother   . Heart disease Paternal Grandfather   . Hypertension Paternal Grandfather   . Diabetes Other   . Breast cancer Paternal Aunt    No Known Allergies Current Outpatient Prescriptions on File Prior to Visit  Medication Sig Dispense Refill  . ALPRAZolam (XANAX) 0.5 MG tablet TAKE 1 TABLET 3 TIMES A DAY AS NEEDED  90 tablet  2  . aspirin 81 MG tablet Take 81 mg by mouth daily.        Marland Kitchen docusate sodium (COLACE) 100 MG capsule Take 100 mg by mouth daily.       Marland Kitchen lisinopril-hydrochlorothiazide (PRINZIDE,ZESTORETIC) 20-25 MG per tablet TAKE 1 TABLET BY MOUTH DAILY.  90 tablet  0  . loratadine (CLARITIN) 10 MG tablet Take 10 mg by mouth daily.        . metroNIDAZOLE (METROGEL) 1 % gel Apply topically to face at bedtime       . omeprazole (PRILOSEC) 20 MG capsule Take 1 capsule (20 mg total) by mouth 2 (two) times daily.  180 capsule  0  . propranolol (INDERAL) 10 MG tablet TAKE  1 TABLET BY MOUTH 3 TIMES DAILY.  90 tablet  0  . sertraline (ZOLOFT) 50 MG tablet TAKE 1 TABLET (50 MG TOTAL) BY MOUTH DAILY. IN EVENING  30 tablet  11  . simvastatin (ZOCOR) 20 MG tablet TAKE 1 TABLET (20 MG TOTAL) BY MOUTH AT BEDTIME.  90 tablet  0  . [DISCONTINUED] atorvastatin (LIPITOR) 10 MG tablet Take 1 tablet (10 mg total) by mouth daily.  30 tablet  11   No current facility-administered medications on file prior to visit.      Review of Systems Review of Systems  Constitutional: Negative for fever, appetite change,  and unexpected weight change.  Eyes: Negative for pain and visual disturbance.  Respiratory: Negative for cough and shortness of breath.   Cardiovascular: Negative for cp or palpitations    Gastrointestinal: Negative for nausea, diarrhea and constipation.  Genitourinary: Negative for urgency and frequency.  Skin: Negative for pallor or rash   Neurological: Negative for weakness, light-headedness, numbness  and headaches.  Hematological: Negative for adenopathy. Does not bruise/bleed easily.  Psychiatric/Behavioral: Negative for dysphoric mood. The patient has anxiety that is fairly controlled .         Objective:   Physical Exam  Constitutional: She appears well-developed and well-nourished. No distress.  overwt and well appearing   HENT:  Head: Normocephalic and atraumatic.  Mouth/Throat: Oropharynx is clear and moist.  Eyes: Conjunctivae and EOM are normal. Pupils are equal, round, and reactive to light. No scleral icterus.  Neck: Normal range of motion. Neck supple.  Cardiovascular: Normal rate, regular rhythm, normal heart sounds and intact distal pulses.  Exam reveals no gallop.   Pulmonary/Chest: Effort normal and breath sounds normal. No respiratory distress. She has no wheezes. She has no rales.  No crackles  Abdominal: Soft. Bowel sounds are normal. She exhibits no distension and no mass. There is no tenderness.  Musculoskeletal: She exhibits no edema.  Lymphadenopathy:    She has no cervical adenopathy.  Neurological: She is alert. She has normal reflexes. No cranial nerve deficit. She exhibits normal muscle tone. Coordination normal.  Skin: Skin is warm and dry. No rash noted. No erythema. No pallor.  Psychiatric: She has a normal mood and affect.          Assessment & Plan:

## 2012-12-29 NOTE — Assessment & Plan Note (Signed)
New px written for [facility] for mask/ tubing/chin strap Will update if they need clarification Doing well with her cpap

## 2012-12-31 ENCOUNTER — Other Ambulatory Visit: Payer: Self-pay | Admitting: *Deleted

## 2012-12-31 MED ORDER — METRONIDAZOLE 1 % EX GEL: Freq: Every day | CUTANEOUS | Status: DC

## 2013-01-06 ENCOUNTER — Telehealth: Payer: Self-pay

## 2013-01-06 NOTE — Telephone Encounter (Signed)
Order in IN box to fax

## 2013-01-06 NOTE — Telephone Encounter (Signed)
Pt left v/m needs prescription for new c pap machine to be faxed to [facility] at [phone]. Pt request call back when cpap prescription faxed.

## 2013-01-06 NOTE — Telephone Encounter (Signed)
Order faxed and pt's husband Adriana Marks notified

## 2013-01-07 NOTE — Telephone Encounter (Addendum)
Melissa received your faxed prescription for a replacement CPAP machine for this patient. Pt's insurance requires that the pressure settings be on the prescription. Per our last RT check, her machine is set at 6 CWP. Could you please have the MD add that to the script and fax it back in? Also, her insurance requires documentation in an office visit note that pt has been using and benefiting from her current CPAP therapy. If the MD could add that to her last office visit note, that would work. Please let me know if you have any questions or if I can help in any way. Thanks, [employer] [phone]

## 2013-01-08 NOTE — Telephone Encounter (Signed)
New order and last OV note faxed back to [facility]

## 2013-02-02 ENCOUNTER — Ambulatory Visit (INDEPENDENT_AMBULATORY_CARE_PROVIDER_SITE_OTHER): Payer: Medicare Other | Admitting: *Deleted

## 2013-02-02 DIAGNOSIS — Z111 Encounter for screening for respiratory tuberculosis: Secondary | ICD-10-CM

## 2013-02-04 ENCOUNTER — Other Ambulatory Visit: Payer: Self-pay | Admitting: Family Medicine

## 2013-02-04 MED ORDER — PROPRANOLOL HCL 10 MG PO TABS: ORAL_TABLET | ORAL | Status: DC

## 2013-02-05 ENCOUNTER — Other Ambulatory Visit: Payer: Self-pay | Admitting: Family Medicine

## 2013-02-05 LAB — TB SKIN TEST
Induration: 0 mm
TB Skin Test: NEGATIVE

## 2013-02-05 MED ORDER — ALPRAZOLAM 0.5 MG PO TABS: ORAL_TABLET | ORAL | Status: DC

## 2013-02-05 NOTE — Telephone Encounter (Signed)
Px written for call in   

## 2013-02-05 NOTE — Telephone Encounter (Signed)
Rx has to be faxed, changed to faxed Rx and Dr. Milinda Antis signed, Rx faxed to [facility]

## 2013-04-23 ENCOUNTER — Encounter: Payer: Self-pay | Admitting: *Deleted

## 2013-04-27 ENCOUNTER — Ambulatory Visit (INDEPENDENT_AMBULATORY_CARE_PROVIDER_SITE_OTHER): Payer: Medicare Other | Admitting: Family Medicine

## 2013-04-27 ENCOUNTER — Encounter: Payer: Self-pay | Admitting: Family Medicine

## 2013-04-27 VITALS — BP 128/82 | HR 66 | Temp 98.4°F | Ht 62.5 in | Wt 184.5 lb

## 2013-04-27 DIAGNOSIS — I10 Essential (primary) hypertension: Secondary | ICD-10-CM

## 2013-04-27 DIAGNOSIS — Z91013 Allergy to seafood: Secondary | ICD-10-CM | POA: Insufficient documentation

## 2013-04-27 MED ORDER — EPINEPHRINE 0.3 MG/0.3ML IJ SOAJ: 0.3000 mg | Freq: Once | INTRAMUSCULAR | Status: DC

## 2013-04-27 NOTE — Progress Notes (Signed)
Subjective:    Patient ID: Adriana Marks, female    DOB: Jul 08, 1946, 67 y.o.   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 Hyperglycemia 08/12/2012  . Other screening mammogram 03/22/2011  . Hyperlipidemia 01/01/2011  . Hypertension 01/01/2011  . GERD (gastroesophageal reflux disease) 01/01/2011  . Routine general medical examination at a health care facility 01/01/2011  . Vitamin D deficiency disease 01/01/2011  . Osteopenia 01/01/2011  . Sleep apnea 01/01/2011  . Anxiety 01/01/2011  . Allergic rhinitis 01/01/2011  . Rosacea 01/01/2011   Past Medical History  Diagnosis Date  . History of chicken pox   . Depression   . Bronchitis   . GERD (gastroesophageal reflux disease)   . Allergy   . Heart murmur   . Hypertension   . Hyperlipidemia   . Migraines   . Colon polyps   . Hx: UTI (urinary tract infection)   . Sleep apnea     uses cpap every night   . Rosacea    No past surgical history on file. History  Substance Use Topics  . Smoking status: Never Smoker   . Smokeless tobacco: Not on file  . Alcohol Use: Yes     Comment: wine occasional   Family History  Problem Relation Age of  Onset  . Heart disease Mother   . Heart disease Father   . Hypertension Father   . Stroke Paternal Grandmother   . Heart disease Paternal Grandfather   . Hypertension Paternal Grandfather   . Diabetes Other   . Breast cancer Paternal Aunt    No Known Allergies Current Outpatient Prescriptions on File Prior to Visit  Medication Sig Dispense Refill  . ALPRAZolam (XANAX) 0.5 MG tablet TAKE 1 TABLET 3 TIMES A DAY AS NEEDED  90 tablet  3  . aspirin 81 MG tablet Take 81 mg by mouth daily.        . Cholecalciferol (VITAMIN D3) 1000 UNITS CAPS Take 1 capsule by mouth daily.      Marland Kitchen docusate sodium (COLACE) 100 MG capsule Take 100 mg by mouth daily.       Marland Kitchen lisinopril-hydrochlorothiazide (PRINZIDE,ZESTORETIC) 20-25 MG per tablet TAKE 1 TABLET BY MOUTH DAILY.  90 tablet  3  . loratadine (CLARITIN) 10 MG tablet Take 10 mg by mouth daily.        . metroNIDAZOLE (METROGEL) 1 % gel Apply topically at bedtime.  180 g  0  . omeprazole (PRILOSEC) 20 MG capsule Take 1 capsule (20  mg total) by mouth 2 (two) times daily.  180 capsule  3  . propranolol (INDERAL) 10 MG tablet TAKE 1 TABLET BY MOUTH 3 TIMES DAILY.  90 tablet  0  . sertraline (ZOLOFT) 50 MG tablet Take 1 tablet (50 mg total) by mouth daily.  90 tablet  3  . simvastatin (ZOCOR) 20 MG tablet TAKE 1 TABLET (20 MG TOTAL) BY MOUTH AT BEDTIME.  90 tablet  3  . [DISCONTINUED] atorvastatin (LIPITOR) 10 MG tablet Take 1 tablet (10 mg total) by mouth daily.  30 tablet  11   No current facility-administered medications on file prior to visit.     Review of Systems Review of Systems  Constitutional: Negative for fever, appetite change, fatigue and unexpected weight change.  Eyes: Negative for pain and visual disturbance.  Respiratory: Negative for cough and shortness of breath.   Cardiovascular: Negative for cp or palpitations    Gastrointestinal: Negative for nausea, diarrhea and constipation.  Genitourinary: Negative for urgency and frequency.   Skin: Negative for pallor or rash   Neurological: Negative for weakness, light-headedness, numbness and headaches.  Hematological: Negative for adenopathy. Does not bruise/bleed easily.  Psychiatric/Behavioral: Negative for dysphoric mood. The patient is not nervous/anxious.         Objective:   Physical Exam  Constitutional: She appears well-developed and well-nourished. No distress.  obese and well appearing   HENT:  Head: Normocephalic and atraumatic.  Mouth/Throat: Oropharynx is clear and moist.  Eyes: Conjunctivae and EOM are normal. Pupils are equal, round, and reactive to light. Right eye exhibits no discharge. Left eye exhibits no discharge. No scleral icterus.  Neck: Normal range of motion. Neck supple. No JVD present. Carotid bruit is not present. No thyromegaly present.  Cardiovascular: Normal rate, regular rhythm, normal heart sounds and intact distal pulses.  Exam reveals no gallop.   Pulmonary/Chest: Effort normal and breath sounds normal. No respiratory distress. She has no wheezes.  Abdominal: Soft. Bowel sounds are normal. She exhibits no distension, no abdominal bruit and no mass. There is no tenderness.  Musculoskeletal: She exhibits no edema and no tenderness.  Lymphadenopathy:    She has no cervical adenopathy.  Neurological: She is alert. She has normal reflexes. No cranial nerve deficit. She exhibits normal muscle tone. Coordination normal.  Skin: Skin is warm and dry. No rash noted. No erythema. No pallor.  Psychiatric: She has a normal mood and affect.          Assessment & Plan:

## 2013-04-27 NOTE — Assessment & Plan Note (Signed)
This is new-pt reports having resp symptoms  Disc absolute avoidance of shellfish  Disc dangers of a 2nd exp now that she is sensitized  Given px for epi pen to have on hand and disc how to use it

## 2013-04-27 NOTE — Assessment & Plan Note (Signed)
Doing better changing am inderal and adding it to pm dose Good bp control bp in fair control at this time  No changes needed  Disc lifstyle change with low sodium diet and exercise

## 2013-04-27 NOTE — Patient Instructions (Addendum)
Blood pressure is good today Keep up regular exercise and work on healthy diet Avoid all shellfish from now on  I sent epi pen px to your pharmacy

## 2013-06-21 ENCOUNTER — Other Ambulatory Visit: Payer: Self-pay | Admitting: Family Medicine

## 2013-06-21 MED ORDER — PROPRANOLOL HCL 10 MG PO TABS: ORAL_TABLET | ORAL | Status: DC

## 2013-06-22 ENCOUNTER — Other Ambulatory Visit: Payer: Self-pay

## 2013-06-22 MED ORDER — PROPRANOLOL HCL 10 MG PO TABS: ORAL_TABLET | ORAL | Status: DC

## 2013-06-22 NOTE — Telephone Encounter (Signed)
Robin at [facility] left v/m that pt experiencing delay from mail order for propranolol and request #30 day supply. Refill sent.

## 2013-08-12 ENCOUNTER — Other Ambulatory Visit: Payer: Self-pay | Admitting: Family Medicine

## 2013-08-12 NOTE — Telephone Encounter (Signed)
Received fax refill request from mail order pharmacy, since it's a mail order pharmacy we would need to do a paper Rx and fax it (no call in), please advise

## 2013-08-13 ENCOUNTER — Ambulatory Visit: Payer: Self-pay | Admitting: Family Medicine

## 2013-08-13 MED ORDER — ALPRAZOLAM 0.5 MG PO TABS: ORAL_TABLET | ORAL | Status: DC

## 2013-08-13 NOTE — Telephone Encounter (Signed)
Printed to fax  

## 2013-08-13 NOTE — Telephone Encounter (Signed)
Rx faxed to [facility]

## 2013-08-16 ENCOUNTER — Encounter: Payer: Self-pay | Admitting: Family Medicine

## 2013-08-17 ENCOUNTER — Encounter: Payer: Self-pay | Admitting: *Deleted

## 2013-08-27 ENCOUNTER — Encounter: Payer: Self-pay | Admitting: Radiology

## 2013-08-29 ENCOUNTER — Telehealth: Payer: Self-pay | Admitting: Family Medicine

## 2013-08-29 DIAGNOSIS — I10 Essential (primary) hypertension: Secondary | ICD-10-CM

## 2013-08-29 DIAGNOSIS — M8580 Other specified disorders of bone density and structure, unspecified site: Secondary | ICD-10-CM

## 2013-08-29 DIAGNOSIS — E785 Hyperlipidemia, unspecified: Secondary | ICD-10-CM

## 2013-08-29 DIAGNOSIS — E559 Vitamin D deficiency, unspecified: Secondary | ICD-10-CM

## 2013-08-29 DIAGNOSIS — R739 Hyperglycemia, unspecified: Secondary | ICD-10-CM

## 2013-08-29 NOTE — Telephone Encounter (Signed)
Message copied by Judy PimpleWER, Matan Steen A on Sun Aug 29, 2013  7:37 PM ------      Message from: Alvina ChouWALSH, TERRI J      Created: Fri Aug 20, 2013 10:33 AM      Regarding: Lab orders for 1.5.15       Patient is scheduled for CPX labs, please order future labs, Thanks , Terri       ------

## 2013-08-30 ENCOUNTER — Other Ambulatory Visit (INDEPENDENT_AMBULATORY_CARE_PROVIDER_SITE_OTHER): Payer: Medicare Other

## 2013-08-30 ENCOUNTER — Encounter: Payer: Self-pay | Admitting: Radiology

## 2013-08-30 DIAGNOSIS — E785 Hyperlipidemia, unspecified: Secondary | ICD-10-CM

## 2013-08-30 DIAGNOSIS — I10 Essential (primary) hypertension: Secondary | ICD-10-CM

## 2013-08-30 DIAGNOSIS — M949 Disorder of cartilage, unspecified: Secondary | ICD-10-CM

## 2013-08-30 DIAGNOSIS — M8580 Other specified disorders of bone density and structure, unspecified site: Secondary | ICD-10-CM

## 2013-08-30 DIAGNOSIS — R7309 Other abnormal glucose: Secondary | ICD-10-CM

## 2013-08-30 DIAGNOSIS — R739 Hyperglycemia, unspecified: Secondary | ICD-10-CM

## 2013-08-30 DIAGNOSIS — E559 Vitamin D deficiency, unspecified: Secondary | ICD-10-CM

## 2013-08-30 DIAGNOSIS — M899 Disorder of bone, unspecified: Secondary | ICD-10-CM

## 2013-08-30 LAB — CBC WITH DIFFERENTIAL/PLATELET
Basophils Absolute: 0 10*3/uL (ref 0.0–0.1)
Basophils Relative: 0.5 % (ref 0.0–3.0)
Eosinophils Absolute: 0.3 10*3/uL (ref 0.0–0.7)
Eosinophils Relative: 3.9 % (ref 0.0–5.0)
HCT: 41.2 % (ref 36.0–46.0)
Hemoglobin: 14 g/dL (ref 12.0–15.0)
Lymphocytes Relative: 29.1 % (ref 12.0–46.0)
Lymphs Abs: 2.2 10*3/uL (ref 0.7–4.0)
MCHC: 33.9 g/dL (ref 30.0–36.0)
MCV: 90.3 fl (ref 78.0–100.0)
Monocytes Absolute: 0.6 10*3/uL (ref 0.1–1.0)
Monocytes Relative: 7.8 % (ref 3.0–12.0)
Neutro Abs: 4.4 10*3/uL (ref 1.4–7.7)
Neutrophils Relative %: 58.7 % (ref 43.0–77.0)
Platelets: 268 10*3/uL (ref 150.0–400.0)
RBC: 4.56 Mil/uL (ref 3.87–5.11)
RDW: 13.3 % (ref 11.5–14.6)
WBC: 7.5 10*3/uL (ref 4.5–10.5)

## 2013-08-30 LAB — COMPREHENSIVE METABOLIC PANEL
ALT: 17 U/L (ref 0–35)
AST: 18 U/L (ref 0–37)
Albumin: 4.2 g/dL (ref 3.5–5.2)
Alkaline Phosphatase: 54 U/L (ref 39–117)
BUN: 15 mg/dL (ref 6–23)
CO2: 30 mEq/L (ref 19–32)
Calcium: 9.7 mg/dL (ref 8.4–10.5)
Chloride: 101 mEq/L (ref 96–112)
Creatinine, Ser: 0.7 mg/dL (ref 0.4–1.2)
GFR: 87.62 mL/min (ref >60.00)
Glucose, Bld: 98 mg/dL (ref 70–99)
Potassium: 3.8 mEq/L (ref 3.5–5.1)
Sodium: 138 mEq/L (ref 135–145)
Total Bilirubin: 0.7 mg/dL (ref 0.3–1.2)
Total Protein: 7.7 g/dL (ref 6.0–8.3)

## 2013-08-30 LAB — LIPID PANEL
Cholesterol: 197 mg/dL (ref 0–200)
HDL: 49.7 mg/dL (ref >39.00)
LDL Cholesterol: 120 mg/dL — ABNORMAL HIGH (ref 0–99)
Total CHOL/HDL Ratio: 4
Triglycerides: 135 mg/dL (ref 0.0–149.0)
VLDL: 27 mg/dL (ref 0.0–40.0)

## 2013-08-30 LAB — HEMOGLOBIN A1C: Hgb A1c MFr Bld: 5.7 % (ref 4.6–6.5)

## 2013-08-30 LAB — TSH: TSH: 0.59 u[IU]/mL (ref 0.35–5.50)

## 2013-08-31 LAB — VITAMIN D 25 HYDROXY (VIT D DEFICIENCY, FRACTURES): Vit D, 25-Hydroxy: 38 ng/mL (ref 30–89)

## 2013-09-06 ENCOUNTER — Encounter: Payer: Self-pay | Admitting: Family Medicine

## 2013-09-06 ENCOUNTER — Ambulatory Visit (INDEPENDENT_AMBULATORY_CARE_PROVIDER_SITE_OTHER): Payer: Medicare Other | Admitting: Family Medicine

## 2013-09-06 VITALS — BP 138/80 | HR 70 | Temp 97.8°F | Ht 62.5 in | Wt 174.5 lb

## 2013-09-06 DIAGNOSIS — M949 Disorder of cartilage, unspecified: Secondary | ICD-10-CM

## 2013-09-06 DIAGNOSIS — E785 Hyperlipidemia, unspecified: Secondary | ICD-10-CM

## 2013-09-06 DIAGNOSIS — R739 Hyperglycemia, unspecified: Secondary | ICD-10-CM

## 2013-09-06 DIAGNOSIS — E559 Vitamin D deficiency, unspecified: Secondary | ICD-10-CM

## 2013-09-06 DIAGNOSIS — R7309 Other abnormal glucose: Secondary | ICD-10-CM

## 2013-09-06 DIAGNOSIS — M899 Disorder of bone, unspecified: Secondary | ICD-10-CM

## 2013-09-06 DIAGNOSIS — Z0000 Encounter for general adult medical examination without abnormal findings: Secondary | ICD-10-CM | POA: Insufficient documentation

## 2013-09-06 DIAGNOSIS — M8580 Other specified disorders of bone density and structure, unspecified site: Secondary | ICD-10-CM

## 2013-09-06 DIAGNOSIS — I10 Essential (primary) hypertension: Secondary | ICD-10-CM

## 2013-09-06 MED ORDER — LORAZEPAM 0.5 MG PO TABS: 0.5000 mg | ORAL_TABLET | Freq: Every evening | ORAL | Status: DC | PRN

## 2013-09-06 NOTE — Assessment & Plan Note (Signed)
Schedule dexa  Disc need for calcium/ vitamin D/ wt bearing exercise and bone density test every 2 y to monitor Disc safety/ fracture risk in detail    

## 2013-09-06 NOTE — Assessment & Plan Note (Signed)
D level stable with current supplementation  Disc imp to bone and overall health

## 2013-09-06 NOTE — Assessment & Plan Note (Signed)
Reviewed health habits including diet and exercise and skin cancer prevention Reviewed appropriate screening tests for age  Also reviewed health mt list, fam hx and immunization status , as well as social and family history   See HPI Lab reviewed  

## 2013-09-06 NOTE — Progress Notes (Signed)
Subjective:    Patient ID: Adriana Marks, female    DOB: May 01, 1946, 68 y.o.   MRN: [id number]  HPI I have personally reviewed the Medicare Annual Wellness questionnaire and have noted 1. The patient's medical and social history 2. Their use of alcohol, tobacco or illicit drugs 3. Their current medications and supplements 4. The patient's functional ability including ADL's, fall risks, home safety risks and hearing or visual             impairment. 5. Diet and physical activities 6. Evidence for depression or mood disorders  The patients weight, height, BMI have been recorded in the chart and visual acuity is per eye clinic.  I have made referrals, counseling and provided education to the patient based review of the above and I have provided the pt with a written personalized care plan for preventive services.  Is doing ok  Stress- husband is in the hospital - with recurrent bowel obst/ scleroderma - doing ok   See scanned forms.  Routine anticipatory guidance given to patient.  See health maintenance. Flu 9/14  Shingles 12/13 vaccine  PNA - was about a year ago  Tetanus shot in 5/12 Colonoscopy 12/10 - had a 5 year follow up  (that will be next December)  Breast cancer screening 12/14  Self exam -no lumps  Advance directive - she does not have a living will set up yet - given the packet on it  Cognitive function addressed- see scanned forms- and if abnormal then additional documentation follows. --no problems at all / even with stressors   Insurance will not cover her xanax anymore - she has options  She usually takes xanax one daily - but inst are up to tid prn  Will try lorazepam    PMH and SH reviewed  Meds, vitals, and allergies reviewed.   ROS: See HPI.  Otherwise negative.    bp is up a bit  today -- was in the ER last night  No cp or palpitations or headaches or edema  No side effects to medicines  BP Readings from Last 3 Encounters:  09/06/13 146/76    04/27/13 128/82  12/29/12 132/80     Osteopenia  D level is 38- takes her ca and D  When she has time - she gets some walking in  dexa in 09  Pap 5/11 - normal  Has not been to a gyn lately  Pap smears have been normal for year No new partners  Would rather not do a pelvic exam   Hyperglycemia Lab Results  Component Value Date   HGBA1C 5.7 08/30/2013   stable - still below 6   Hyperlipidemia Lab Results  Component Value Date   CHOL 197 08/30/2013   CHOL 171 08/05/2012   CHOL 142 03/06/2011   Lab Results  Component Value Date   HDL 49.70 08/30/2013   HDL 46.00 08/05/2012   HDL 55.50 03/06/2011   Lab Results  Component Value Date   LDLCALC 120* 08/30/2013   LDLCALC 107* 08/05/2012   LDLCALC 79 03/06/2011   Lab Results  Component Value Date   TRIG 135.0 08/30/2013   TRIG 88.0 08/05/2012   TRIG 39.0 03/06/2011   Lab Results  Component Value Date   CHOLHDL 4 08/30/2013   CHOLHDL 4 08/05/2012   CHOLHDL 3 03/06/2011   No results found for this basename: LDLDIRECT    LDL is up a bit -no missed doses of simvastatin  Her diet is  off a bit - eating too much fatty food  Likes chicken and lean fish     Chemistry      Component Value Date/Time   NA 138 08/30/2013 0852   K 3.8 08/30/2013 0852   CL 101 08/30/2013 0852   CO2 30 08/30/2013 0852   BUN 15 08/30/2013 0852   CREATININE 0.7 08/30/2013 0852      Component Value Date/Time   CALCIUM 9.7 08/30/2013 0852   ALKPHOS 54 08/30/2013 0852   AST 18 08/30/2013 0852   ALT 17 08/30/2013 0852   BILITOT 0.7 08/30/2013 0852      Lab Results  Component Value Date   WBC 7.5 08/30/2013   HGB 14.0 08/30/2013   HCT 41.2 08/30/2013   MCV 90.3 08/30/2013   PLT 268.0 08/30/2013    Lab Results  Component Value Date   TSH 0.59 08/30/2013      Patient Active Problem List   Diagnosis Date Noted  . Encounter for Medicare annual wellness exam 09/06/2013  . 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 Hyperglycemia 08/12/2012  . Other screening mammogram 03/22/2011  .  Hyperlipidemia 01/01/2011  . Hypertension 01/01/2011  . GERD (gastroesophageal reflux disease) 01/01/2011  . Routine general medical examination at a health care facility 01/01/2011  . Vitamin D deficiency disease 01/01/2011  . Osteopenia 01/01/2011  . Sleep apnea 01/01/2011  . Anxiety 01/01/2011  . Allergic rhinitis 01/01/2011  . Rosacea 01/01/2011   Past Medical History  Diagnosis Date  . History of chicken pox   . Depression   . Bronchitis   . GERD (gastroesophageal reflux disease)   . Allergy   . Heart murmur   . Hypertension   . Hyperlipidemia   . Migraines   . Colon polyps   . Hx: UTI (urinary tract infection)   . Sleep apnea     uses cpap every night   . Rosacea    No past surgical history on file. History  Substance Use Topics  . Smoking status: Never Smoker   . Smokeless tobacco: Not on file  . Alcohol Use: Yes     Comment: wine occasional   Family History  Problem Relation Age of Onset  . Heart disease Mother   . Heart disease Father   . Hypertension Father   . Stroke Paternal Grandmother   . Heart disease Paternal Grandfather   . Hypertension Paternal Grandfather   . Diabetes Other   . Breast cancer Paternal Aunt    Allergies  Allergen Reactions  . Shrimp [Shellfish Allergy]     Sob/ swelling    Current Outpatient Prescriptions on File Prior to Visit  Medication Sig Dispense Refill  . ALPRAZolam (XANAX) 0.5 MG tablet TAKE 1 TABLET 3 TIMES A DAY AS NEEDED  90 tablet  3  . aspirin 81 MG tablet Take 81 mg by mouth daily.        . Cholecalciferol (VITAMIN D3) 1000 UNITS CAPS Take 1 capsule by mouth daily.      Marland Kitchen docusate sodium (COLACE) 100 MG capsule Take 100 mg by mouth daily.       Marland Kitchen EPINEPHrine (EPI-PEN) 0.3 mg/0.3 mL SOAJ injection Inject 0.3 mLs (0.3 mg total) into the muscle once. For allergic reaction  1 Device  3  . lisinopril-hydrochlorothiazide (PRINZIDE,ZESTORETIC) 20-25 MG per tablet TAKE 1 TABLET BY MOUTH DAILY.  90 tablet  3  .  loratadine (CLARITIN) 10 MG tablet Take 10 mg by mouth daily.        Marland Kitchen  metroNIDAZOLE (METROGEL) 1 % gel Apply topically at bedtime.  180 g  0  . omeprazole (PRILOSEC) 20 MG capsule Take 1 capsule (20 mg total) by mouth 2 (two) times daily.  180 capsule  3  . propranolol (INDERAL) 10 MG tablet TAKE 1 TABLET BY MOUTH 3 TIMES DAILY.  90 tablet  0  . sertraline (ZOLOFT) 50 MG tablet Take 1 tablet (50 mg total) by mouth daily.  90 tablet  3  . simvastatin (ZOCOR) 20 MG tablet TAKE 1 TABLET (20 MG TOTAL) BY MOUTH AT BEDTIME.  90 tablet  3  . [DISCONTINUED] atorvastatin (LIPITOR) 10 MG tablet Take 1 tablet (10 mg total) by mouth daily.  30 tablet  11   No current facility-administered medications on file prior to visit.    Review of Systems    Review of Systems  Constitutional: Negative for fever, appetite change, fatigue and unexpected weight change.  Eyes: Negative for pain and visual disturbance.  Respiratory: Negative for cough and shortness of breath.   Cardiovascular: Negative for cp or palpitations    Gastrointestinal: Negative for nausea, diarrhea and constipation.  Genitourinary: Negative for urgency and frequency.  Skin: Negative for pallor or rash   Neurological: Negative for weakness, light-headedness, numbness and headaches.  Hematological: Negative for adenopathy. Does not bruise/bleed easily.  Psychiatric/Behavioral: Negative for dysphoric mood. The patient is not nervous/anxious.  pos for stressors     Objective:   Physical Exam  Constitutional: She appears well-developed and well-nourished. No distress.  HENT:  Head: Normocephalic and atraumatic.  Right Ear: External ear normal.  Left Ear: External ear normal.  Mouth/Throat: Oropharynx is clear and moist.  Eyes: Conjunctivae and EOM are normal. Pupils are equal, round, and reactive to light. No scleral icterus.  Neck: Normal range of motion. Neck supple. No JVD present. Carotid bruit is not present. No thyromegaly present.   Cardiovascular: Normal rate, regular rhythm, normal heart sounds and intact distal pulses.  Exam reveals no gallop.   Pulmonary/Chest: Effort normal and breath sounds normal. No respiratory distress. She has no wheezes. She exhibits no tenderness.  Abdominal: Soft. Bowel sounds are normal. She exhibits no distension, no abdominal bruit and no mass. There is no tenderness.  Genitourinary: No breast swelling, tenderness, discharge or bleeding.  Breast exam: No mass, nodules, thickening, tenderness, bulging, retraction, inflamation, nipple discharge or skin changes noted.  No axillary or clavicular LA.    Musculoskeletal: Normal range of motion. She exhibits no edema and no tenderness.  Lymphadenopathy:    She has no cervical adenopathy.  Neurological: She is alert. She has normal reflexes. No cranial nerve deficit. She exhibits normal muscle tone. Coordination normal.  Skin: Skin is warm and dry. No rash noted. No erythema. No pallor.  Psychiatric: She has a normal mood and affect.          Assessment & Plan:

## 2013-09-06 NOTE — Assessment & Plan Note (Signed)
Lab Results  Component Value Date   HGBA1C 5.7 08/30/2013   Stable Rev low glycemic diet and exercise

## 2013-09-06 NOTE — Assessment & Plan Note (Signed)
Cholesterol is up Disc goals for lipids and reasons to control them Rev labs with pt Rev low sat fat diet in detail Re check in 6 mo after better diet

## 2013-09-06 NOTE — Patient Instructions (Signed)
Don't forget to work on a living will  Stop up front for referral for bone density test  Follow up in 6 months with labs prior

## 2013-09-06 NOTE — Progress Notes (Signed)
Pre-visit discussion using our clinic review tool. No additional management support is needed unless otherwise documented below in the visit note.  

## 2013-09-06 NOTE — Assessment & Plan Note (Signed)
BP: 138/80 mmHg  bp in fair control at this time  No changes needed Disc lifstyle change with low sodium diet and exercise   Lab reviewed

## 2013-09-23 ENCOUNTER — Encounter: Payer: Self-pay | Admitting: Family Medicine

## 2013-09-29 ENCOUNTER — Telehealth: Payer: Self-pay | Admitting: Family Medicine

## 2013-09-29 NOTE — Telephone Encounter (Signed)
Relevant patient education mailed to patient.  

## 2013-10-05 ENCOUNTER — Other Ambulatory Visit: Payer: Self-pay | Admitting: Family Medicine

## 2013-10-05 MED ORDER — SERTRALINE HCL 50 MG PO TABS: 50.0000 mg | ORAL_TABLET | Freq: Every day | ORAL | Status: DC

## 2013-10-05 NOTE — Telephone Encounter (Signed)
done

## 2013-10-05 NOTE — Telephone Encounter (Signed)
Fax refill request, please advise  

## 2013-10-05 NOTE — Telephone Encounter (Signed)
Please refill for a year thanks 

## 2013-10-08 ENCOUNTER — Other Ambulatory Visit: Payer: Self-pay | Admitting: *Deleted

## 2013-10-08 MED ORDER — ALPRAZOLAM 0.5 MG PO TBDP: 0.5000 mg | ORAL_TABLET | Freq: Two times a day (BID) | ORAL | Status: DC | PRN

## 2013-10-08 NOTE — Telephone Encounter (Signed)
Pt requesting medication refill. Last ov 09/06/13 for CPE. Medication on Hx list only, last refilled 07/2013

## 2013-10-08 NOTE — Telephone Encounter (Signed)
Px written for call in   

## 2013-10-11 MED ORDER — ALPRAZOLAM 0.5 MG PO TABS: 0.5000 mg | ORAL_TABLET | Freq: Two times a day (BID) | ORAL | Status: DC | PRN

## 2013-10-11 NOTE — Telephone Encounter (Signed)
Rx faxed to [facility]

## 2013-10-11 NOTE — Telephone Encounter (Signed)
Pt needs regular xanax not the dissolvable tab. Rx changed also pt need paper Rx (not called in) since it's going to a mail order pharmacy

## 2013-10-11 NOTE — Addendum Note (Signed)
Addended by: Shon MilletWATLINGTON, Josette Shimabukuro M on: 10/11/2013 11:06 AM   Modules accepted: Orders

## 2013-10-11 NOTE — Addendum Note (Signed)
Addended by: Shon MilletWATLINGTON, Anselma Herbel M on: 10/11/2013 11:05 AM   Modules accepted: Orders

## 2013-10-11 NOTE — Addendum Note (Signed)
Addended by: Roxy MannsWER, Adasia Hoar A on: 10/11/2013 11:21 AM   Modules accepted: Orders

## 2013-10-11 NOTE — Telephone Encounter (Signed)
Px printed for pick up in IN box  

## 2013-11-05 ENCOUNTER — Other Ambulatory Visit: Payer: Self-pay | Admitting: *Deleted

## 2013-11-05 MED ORDER — SIMVASTATIN 20 MG PO TABS: ORAL_TABLET | ORAL | Status: DC

## 2013-11-08 ENCOUNTER — Telehealth: Payer: Self-pay | Admitting: *Deleted

## 2013-11-08 ENCOUNTER — Other Ambulatory Visit: Payer: Self-pay | Admitting: *Deleted

## 2013-11-08 MED ORDER — OMEPRAZOLE 20 MG PO CPDR: 20.0000 mg | DELAYED_RELEASE_CAPSULE | Freq: Two times a day (BID) | ORAL | Status: DC

## 2013-11-08 MED ORDER — LISINOPRIL-HYDROCHLOROTHIAZIDE 20-25 MG PO TABS: ORAL_TABLET | ORAL | Status: DC

## 2013-11-08 MED ORDER — METRONIDAZOLE 1 % EX GEL
Freq: Every day | CUTANEOUS | Status: DC
Start: 2013-11-08 — End: 2014-09-09

## 2013-11-08 NOTE — Telephone Encounter (Signed)
Which medicines?

## 2013-11-08 NOTE — Telephone Encounter (Signed)
Pt requesting medication refill. Last ov 08/2013 with f/u appt 02/2013. pls advise

## 2013-11-08 NOTE — Addendum Note (Signed)
Addended by: Shon MilletWATLINGTON, Amilee Janvier M on: 11/08/2013 11:13 AM   Modules accepted: Orders

## 2013-11-08 NOTE — Telephone Encounter (Signed)
Received fax refill request for the medications, and I have already filled them

## 2013-11-08 NOTE — Addendum Note (Signed)
Addended by: Shon MilletWATLINGTON, Dashawn Golda M on: 11/08/2013 04:59 PM   Modules accepted: Orders

## 2013-11-12 ENCOUNTER — Other Ambulatory Visit: Payer: Self-pay | Admitting: Family Medicine

## 2013-11-12 NOTE — Telephone Encounter (Signed)
Fax refill request, please advise (will need paper Rx faxed to Mail order)

## 2013-11-16 MED ORDER — ALPRAZOLAM 0.5 MG PO TABS: 0.5000 mg | ORAL_TABLET | Freq: Two times a day (BID) | ORAL | Status: DC | PRN

## 2013-11-16 NOTE — Telephone Encounter (Signed)
It sounds like she needs this for mail order - she did have a refill 5 weeks ago - it may need to be cancelled depending on the details Px printed for fax or pick up in IN box

## 2013-11-19 NOTE — Telephone Encounter (Signed)
Called [facility] and pt got a refill (91month supply) on 10/29/13 and still has 1 additional refill on file with them so she didn't need this Rx (Rx destroyed and I didn't fax [facility] to [facility])

## 2014-01-10 ENCOUNTER — Telehealth: Payer: Self-pay

## 2014-01-10 NOTE — Telephone Encounter (Signed)
Pt said got automated call from mail order pharmacy that ins was not approving lorazepam; pt has never filled the lorazepam but has continued to get alprazolam. Pt will call and speak with someone at mail order pharmacy to see what is needed and if PA is needed pt will have mail order send PA request.

## 2014-01-13 ENCOUNTER — Other Ambulatory Visit: Payer: Self-pay | Admitting: Family Medicine

## 2014-01-13 NOTE — Telephone Encounter (Signed)
Fax refill request, Rx that was done on 11/19/13 was cancelled because pt still had 1 refill on file with pharmacy so last Rx that was sent was back in feb. So pt is due for a refill   Will need printed Rx faxed to mail order pharmacy

## 2014-01-14 MED ORDER — ALPRAZOLAM 0.5 MG PO TABS: 0.5000 mg | ORAL_TABLET | Freq: Two times a day (BID) | ORAL | Status: DC | PRN

## 2014-01-14 NOTE — Telephone Encounter (Signed)
Px printed for pick up in IN box  

## 2014-01-14 NOTE — Telephone Encounter (Signed)
Rx faxed

## 2014-03-01 ENCOUNTER — Other Ambulatory Visit: Payer: Medicare Other

## 2014-03-08 ENCOUNTER — Ambulatory Visit: Payer: Medicare Other | Admitting: Family Medicine

## 2014-03-10 ENCOUNTER — Other Ambulatory Visit: Payer: Self-pay | Admitting: Family Medicine

## 2014-03-10 NOTE — Telephone Encounter (Signed)
Fax refill request, since it is going to mail order pharmacy we would have to print Rx and fax it to [phone]

## 2014-03-11 MED ORDER — ALPRAZOLAM 0.5 MG PO TABS: 0.5000 mg | ORAL_TABLET | Freq: Two times a day (BID) | ORAL | Status: DC | PRN

## 2014-03-11 NOTE — Telephone Encounter (Signed)
Rx faxed to [facility]

## 2014-03-11 NOTE — Telephone Encounter (Signed)
Printed for fax

## 2014-04-25 ENCOUNTER — Other Ambulatory Visit: Payer: Self-pay | Admitting: *Deleted

## 2014-04-25 MED ORDER — OMEPRAZOLE 20 MG PO CPDR: 20.0000 mg | DELAYED_RELEASE_CAPSULE | Freq: Two times a day (BID) | ORAL | Status: DC

## 2014-07-11 ENCOUNTER — Other Ambulatory Visit: Payer: Self-pay | Admitting: *Deleted

## 2014-07-11 MED ORDER — LISINOPRIL-HYDROCHLOROTHIAZIDE 20-25 MG PO TABS: ORAL_TABLET | ORAL | Status: DC

## 2014-09-02 ENCOUNTER — Encounter: Payer: Self-pay | Admitting: *Deleted

## 2014-09-02 ENCOUNTER — Telehealth: Payer: Self-pay | Admitting: Family Medicine

## 2014-09-02 ENCOUNTER — Other Ambulatory Visit (INDEPENDENT_AMBULATORY_CARE_PROVIDER_SITE_OTHER): Payer: Medicare HMO

## 2014-09-02 DIAGNOSIS — I10 Essential (primary) hypertension: Secondary | ICD-10-CM

## 2014-09-02 DIAGNOSIS — E559 Vitamin D deficiency, unspecified: Secondary | ICD-10-CM

## 2014-09-02 DIAGNOSIS — E785 Hyperlipidemia, unspecified: Secondary | ICD-10-CM

## 2014-09-02 DIAGNOSIS — R739 Hyperglycemia, unspecified: Secondary | ICD-10-CM

## 2014-09-02 DIAGNOSIS — M8580 Other specified disorders of bone density and structure, unspecified site: Secondary | ICD-10-CM

## 2014-09-02 LAB — LIPID PANEL
Cholesterol: 167 mg/dL (ref 0–200)
HDL: 47.3 mg/dL (ref >39.00)
LDL Cholesterol: 106 mg/dL — ABNORMAL HIGH (ref 0–99)
NonHDL: 119.7
Total CHOL/HDL Ratio: 4
Triglycerides: 71 mg/dL (ref 0.0–149.0)
VLDL: 14.2 mg/dL (ref 0.0–40.0)

## 2014-09-02 LAB — CBC WITH DIFFERENTIAL/PLATELET
Basophils Absolute: 0 10*3/uL (ref 0.0–0.1)
Basophils Relative: 0.2 % (ref 0.0–3.0)
Eosinophils Absolute: 0.3 10*3/uL (ref 0.0–0.7)
Eosinophils Relative: 2.5 % (ref 0.0–5.0)
HCT: 42.5 % (ref 36.0–46.0)
Hemoglobin: 14.2 g/dL (ref 12.0–15.0)
Lymphocytes Relative: 25.2 % (ref 12.0–46.0)
Lymphs Abs: 2.5 10*3/uL (ref 0.7–4.0)
MCHC: 33.4 g/dL (ref 30.0–36.0)
MCV: 92.6 fl (ref 78.0–100.0)
Monocytes Absolute: 0.7 10*3/uL (ref 0.1–1.0)
Monocytes Relative: 6.6 % (ref 3.0–12.0)
Neutro Abs: 6.6 10*3/uL (ref 1.4–7.7)
Neutrophils Relative %: 65.5 % (ref 43.0–77.0)
Platelets: 283 10*3/uL (ref 150.0–400.0)
RBC: 4.59 Mil/uL (ref 3.87–5.11)
RDW: 12.6 % (ref 11.5–15.5)
WBC: 10.1 10*3/uL (ref 4.0–10.5)

## 2014-09-02 LAB — COMPREHENSIVE METABOLIC PANEL
ALT: 16 U/L (ref 0–35)
AST: 18 U/L (ref 0–37)
Albumin: 4.2 g/dL (ref 3.5–5.2)
Alkaline Phosphatase: 62 U/L (ref 39–117)
BUN: 14 mg/dL (ref 6–23)
CO2: 27 mEq/L (ref 19–32)
Calcium: 9.6 mg/dL (ref 8.4–10.5)
Chloride: 103 mEq/L (ref 96–112)
Creatinine, Ser: 0.7 mg/dL (ref 0.4–1.2)
GFR: 83.27 mL/min (ref >60.00)
Glucose, Bld: 103 mg/dL — ABNORMAL HIGH (ref 70–99)
Potassium: 3.8 mEq/L (ref 3.5–5.1)
Sodium: 138 mEq/L (ref 135–145)
Total Bilirubin: 0.4 mg/dL (ref 0.2–1.2)
Total Protein: 7.6 g/dL (ref 6.0–8.3)

## 2014-09-02 LAB — HEMOGLOBIN A1C: Hgb A1c MFr Bld: 5.7 % (ref 4.6–6.5)

## 2014-09-02 LAB — VITAMIN D 25 HYDROXY (VIT D DEFICIENCY, FRACTURES): VITD: 21.78 ng/mL — ABNORMAL LOW (ref 30.00–100.00)

## 2014-09-02 LAB — TSH: TSH: 2.62 u[IU]/mL (ref 0.35–4.50)

## 2014-09-02 NOTE — Telephone Encounter (Signed)
-----   Message from Alvina Chouerri J Walsh sent at 08/23/2014  2:49 PM EST ----- Regarding: Lab orders for Friday, 1.8.16 Patient is scheduled for CPX labs, please order future labs, Thanks , Terri   Has some test ordered, not sure if you wanted other tests

## 2014-09-09 ENCOUNTER — Encounter: Payer: Self-pay | Admitting: Family Medicine

## 2014-09-09 ENCOUNTER — Ambulatory Visit (INDEPENDENT_AMBULATORY_CARE_PROVIDER_SITE_OTHER): Payer: Medicare HMO | Admitting: Family Medicine

## 2014-09-09 VITALS — BP 132/78 | HR 82 | Temp 98.0°F | Ht 63.0 in | Wt 163.1 lb

## 2014-09-09 DIAGNOSIS — M8580 Other specified disorders of bone density and structure, unspecified site: Secondary | ICD-10-CM

## 2014-09-09 DIAGNOSIS — Z0000 Encounter for general adult medical examination without abnormal findings: Secondary | ICD-10-CM

## 2014-09-09 DIAGNOSIS — Z23 Encounter for immunization: Secondary | ICD-10-CM

## 2014-09-09 DIAGNOSIS — F4320 Adjustment disorder, unspecified: Secondary | ICD-10-CM

## 2014-09-09 DIAGNOSIS — F4321 Adjustment disorder with depressed mood: Secondary | ICD-10-CM

## 2014-09-09 DIAGNOSIS — R739 Hyperglycemia, unspecified: Secondary | ICD-10-CM

## 2014-09-09 DIAGNOSIS — I10 Essential (primary) hypertension: Secondary | ICD-10-CM

## 2014-09-09 DIAGNOSIS — E785 Hyperlipidemia, unspecified: Secondary | ICD-10-CM

## 2014-09-09 DIAGNOSIS — E559 Vitamin D deficiency, unspecified: Secondary | ICD-10-CM

## 2014-09-09 MED ORDER — OMEPRAZOLE 20 MG PO CPDR: 20.0000 mg | DELAYED_RELEASE_CAPSULE | Freq: Two times a day (BID) | ORAL | Status: DC

## 2014-09-09 MED ORDER — SIMVASTATIN 20 MG PO TABS: ORAL_TABLET | ORAL | Status: DC

## 2014-09-09 MED ORDER — LISINOPRIL-HYDROCHLOROTHIAZIDE 20-25 MG PO TABS: ORAL_TABLET | ORAL | Status: DC

## 2014-09-09 NOTE — Progress Notes (Signed)
Subjective:    Patient ID: Adriana Marks, female    DOB: 1946-07-13, 69 y.o.   MRN: [id number]  HPI Here for annual medicare wellness visit and also acute/chronic medical problems   I have personally reviewed the Medicare Annual Wellness questionnaire and have noted 1. The patient's medical and social history 2. Their use of alcohol, tobacco or illicit drugs 3. Their current medications and supplements 4. The patient's functional ability including ADL's, fall risks, home safety risks and hearing or visual             impairment. 5. Diet and physical activities 6. Evidence for depression or mood disorders  The patients weight, height, BMI have been recorded in the chart and visual acuity is per eye clinic.  I have made referrals, counseling and provided education to the patient based review of the above and I have provided the pt with a written personalized care plan for preventive services.  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  Routine anticipatory guidance given to patient.  See health maintenance. Colon cancer screening 12/10- normal  Breast cancer screening 12/14 -has it set up 09/26/14  Self breast exam no lumps or changes  Flu vaccine 9/15 Tetanus vaccine 5/12 Pneumovax 1/14  prevnar - wants today Eye exam - Monday of this week - ok , small cataract  Zoster vaccine 12/13    Advance directive  living will and POA  Cognitive function addressed- see scanned forms- and if abnormal then additional documentation follows.  No problems /concerns   PMH and SH reviewed  Meds, vitals, and allergies reviewed.   ROS: See HPI.  Otherwise negative.     wt is down 11 lb with bmi of 28  Hyperglycemia Lab Results  Component Value Date   HGBA1C 5.7 09/02/2014    Hx of osteopenia Nl dexa 8/09 Wants to hold off on it yet  D level is low at 21  bp is stable today  No cp or palpitations or headaches or edema  No side effects to medicines  BP Readings from Last 3 Encounters:  09/09/14 132/78  09/06/13 138/80  04/27/13 128/82    hyperlidemia Statin and diet controlled Lab Results  Component Value Date   CHOL 167 09/02/2014   CHOL 197 08/30/2013   CHOL 171 08/05/2012   Lab Results  Component Value Date   HDL 47.30 09/02/2014   HDL 49.70 08/30/2013   HDL 46.00 08/05/2012   Lab Results  Component Value Date   LDLCALC 106* 09/02/2014   LDLCALC 120* 08/30/2013   LDLCALC 107* 08/05/2012   Lab Results  Component Value Date   TRIG 71.0 09/02/2014   TRIG 135.0 08/30/2013   TRIG 88.0 08/05/2012  Lab Results  Component Value Date   CHOLHDL 4 09/02/2014   CHOLHDL 4 08/30/2013   CHOLHDL 4 08/05/2012   No results found for: LDLDIRECT  Stable to improved    Patient Active Problem List   Diagnosis Date Noted  . Encounter for Medicare annual wellness exam 09/06/2013  . 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 Hyperglycemia 08/12/2012  . Other screening mammogram 03/22/2011  . Hyperlipidemia 01/01/2011  . Hypertension 01/01/2011  . GERD (gastroesophageal reflux disease) 01/01/2011  . Routine general medical examination at a health care facility 01/01/2011  . Vitamin D deficiency disease 01/01/2011  . Osteopenia 01/01/2011  . Sleep apnea 01/01/2011  . Anxiety 01/01/2011  . Allergic rhinitis 01/01/2011  . Rosacea 01/01/2011   Past Medical History  Diagnosis Date  . History  of chicken pox   . Depression   . Bronchitis   . GERD (gastroesophageal reflux disease)   . Allergy   . Heart murmur   . Hypertension   . Hyperlipidemia   . Migraines   . Colon polyps   . Hx: UTI (urinary tract infection)   . Sleep apnea     uses cpap every night   . Rosacea    No past surgical history on file. History  Substance Use Topics  . Smoking status: Never Smoker   . Smokeless tobacco: Not on file  . Alcohol Use: 0.0 oz/week    0 Not specified per week     Comment: wine occasional   Family History  Problem Relation Age of Onset  . Heart disease Mother   . Heart disease Father   . Hypertension Father   . Stroke Paternal Grandmother   . Heart disease Paternal Grandfather   . Hypertension Paternal Grandfather   . Diabetes Other   . Breast cancer Paternal Aunt    Allergies  Allergen Reactions  . Shrimp [Shellfish Allergy]     Sob/ swelling    Current Outpatient Prescriptions on File Prior to Visit  Medication Sig Dispense Refill  . ALPRAZolam (XANAX) 0.5 MG tablet Take 1 tablet (0.5 mg total) by mouth 2 (two) times daily as needed for anxiety. 180 tablet 0  . aspirin 81 MG tablet Take 81 mg by mouth daily.      . Cholecalciferol (VITAMIN D3) 1000 UNITS CAPS Take 1 capsule by mouth daily.    Marland Kitchen. docusate sodium (COLACE) 100 MG capsule Take 100 mg by mouth daily.     Marland Kitchen. EPINEPHrine (EPI-PEN) 0.3 mg/0.3 mL SOAJ injection Inject 0.3 mLs (0.3 mg total) into the muscle once. For allergic reaction 1 Device 3  . lisinopril-hydrochlorothiazide (PRINZIDE,ZESTORETIC) 20-25 MG per tablet TAKE 1 TABLET BY MOUTH DAILY. 90 tablet 0  . LORazepam (ATIVAN) 0.5 MG tablet Take 1 tablet (0.5 mg total) by mouth at bedtime as needed for anxiety. 90 tablet 0  . omeprazole (PRILOSEC) 20 MG capsule Take 1 capsule (20 mg total) by mouth 2 (two) times daily. 180 capsule 1  . sertraline (ZOLOFT) 50 MG tablet Take 1 tablet (50 mg total) by mouth daily. 90 tablet 3  . simvastatin (ZOCOR) 20 MG  tablet TAKE 1 TABLET (20 MG TOTAL) BY MOUTH AT BEDTIME. 90 tablet 3  . metroNIDAZOLE (METROGEL) 1 % gel Apply topically at bedtime. (Patient not taking: Reported on 09/09/2014) 180 g 1  . propranolol (INDERAL) 10 MG tablet TAKE 1 TABLET BY MOUTH 3 TIMES DAILY. (Patient not taking: Reported on 09/09/2014) 90 tablet 0  . [DISCONTINUED] atorvastatin (LIPITOR) 10 MG  tablet Take 1 tablet (10 mg total) by mouth daily. 30 tablet 11   No current facility-administered medications on file prior to visit.    Review of Systems Review of Systems  Constitutional: Negative for fever, appetite change, fatigue and unexpected weight change.  Eyes: Negative for pain and visual disturbance.  Respiratory: Negative for cough and shortness of breath.   Cardiovascular: Negative for cp or palpitations    Gastrointestinal: Negative for nausea, diarrhea and constipation.  Genitourinary: Negative for urgency and frequency.  Skin: Negative for pallor or rash   Neurological: Negative for weakness, light-headedness, numbness and headaches.  Hematological: Negative for adenopathy. Does not bruise/bleed easily.  Psychiatric/Behavioral: Negative for dysphoric mood. The patient is not nervous/anxious.  pos for grief        Objective:   Physical Exam  Constitutional: She appears well-developed and well-nourished. No distress.  overwt and well appearing   HENT:  Head: Normocephalic and atraumatic.  Right Ear: External ear normal.  Left Ear: External ear normal.  Mouth/Throat: Oropharynx is clear and moist.  Eyes: Conjunctivae and EOM are normal. Pupils are equal, round, and reactive to light. No scleral icterus.  Neck: Normal range of motion. Neck supple. No JVD present. Carotid bruit is not present. No thyromegaly present.  Cardiovascular: Normal rate, regular rhythm, normal heart sounds and intact distal pulses.  Exam reveals no gallop.   No M heard today  Pulmonary/Chest: Effort normal and breath sounds normal. No  respiratory distress. She has no wheezes. She exhibits no tenderness.  Abdominal: Soft. Bowel sounds are normal. She exhibits no distension, no abdominal bruit and no mass. There is no tenderness.  Genitourinary: No breast swelling, tenderness, discharge or bleeding.  Breast exam: No mass, nodules, thickening, tenderness, bulging, retraction, inflamation, nipple discharge or skin changes noted.  No axillary or clavicular LA.      Musculoskeletal: Normal range of motion. She exhibits no edema or tenderness.  No kyphosis   Lymphadenopathy:    She has no cervical adenopathy.  Neurological: She is alert. She has normal reflexes. No cranial nerve deficit. She exhibits normal muscle tone. Coordination normal.  Skin: Skin is warm and dry. No rash noted. No erythema. No pallor.  Psychiatric: She has a normal mood and affect.          Assessment & Plan:   Problem List Items Addressed This Visit      Cardiovascular and Mediastinum   Hypertension    bp in fair control at this time  BP Readings from Last 1 Encounters:  09/09/14 132/78   No changes needed Disc lifstyle change with low sodium diet and exercise  Labs reviewed       Relevant Medications   lisinopril-hydrochlorothiazide (PRINZIDE,ZESTORETIC) 20-25 MG per tablet   simvastatin (ZOCOR) tablet     Musculoskeletal and Integument   Osteopenia    Disc need for calcium/ vitamin D/ wt bearing exercise and bone density test every 2 y to monitor Disc safety/ fracture risk in detail   She will call back to schedule dexa - not ready to yet  Last dexa -BMD was in the normal range No falls or fx         Other   Encounter for Medicare annual wellness exam - Primary    Reviewed health habits including diet and exercise and skin cancer prevention Reviewed appropriate screening tests for age  Also reviewed health mt list, fam hx and immunization status , as well as social and family history  See HPI Labs reviewed prevnar vaccine  today  Will call to schedule dexa when ready      Grief reaction    After loosing husband Reviewed stressors/ coping techniques/symptoms/ support sources/ tx options and side effects in detail today Overall coping skills are good and good support  Working helps  Will update if she desires counseling       Hyperglycemia    Stable Wt and diet are better Lab Results  Component Value Date   HGBA1C 5.7 09/02/2014   Continue to follow       Hyperlipidemia    Disc goals for lipids and reasons to control them Rev labs with pt Rev low sat fat diet in detail Doing well with statin and diet (better diet) Improved       Relevant Medications   lisinopril-hydrochlorothiazide (PRINZIDE,ZESTORETIC) 20-25 MG per tablet   simvastatin (ZOCOR) tablet   Vitamin D deficiency disease    Need to increase dose  Inc to 3000 iu daily  Disc imp to general health and bone health       Other Visit Diagnoses    Need for vaccination with 13-polyvalent pneumococcal conjugate vaccine        Relevant Orders    Pneumococcal conjugate vaccine 13-valent (Completed)

## 2014-09-09 NOTE — Assessment & Plan Note (Signed)
After loosing husband Reviewed stressors/ coping techniques/symptoms/ support sources/ tx options and side effects in detail today Overall coping skills are good and good support  Working helps  Will update if she desires counseling

## 2014-09-09 NOTE — Assessment & Plan Note (Signed)
Stable Wt and diet are better Lab Results  Component Value Date   HGBA1C 5.7 09/02/2014   Continue to follow

## 2014-09-09 NOTE — Assessment & Plan Note (Signed)
Disc need for calcium/ vitamin D/ wt bearing exercise and bone density test every 2 y to monitor Disc safety/ fracture risk in detail   She will call back to schedule dexa - not ready to yet  Last dexa -BMD was in the normal range No falls or fx

## 2014-09-09 NOTE — Patient Instructions (Addendum)
Take care of yourself  If you want counseling for grief at any time please let me know  Call us when you are ready to schedule your bone density test Increase your vitamin D to 3000 iu daily  prevnar vaccine today

## 2014-09-09 NOTE — Assessment & Plan Note (Signed)
bp in fair control at this time  BP Readings from Last 1 Encounters:  09/09/14 132/78   No changes needed Disc lifstyle change with low sodium diet and exercise  Labs reviewed

## 2014-09-09 NOTE — Assessment & Plan Note (Signed)
Disc goals for lipids and reasons to control them Rev labs with pt Rev low sat fat diet in detail Doing well with statin and diet (better diet) Improved

## 2014-09-09 NOTE — Assessment & Plan Note (Signed)
Reviewed health habits including diet and exercise and skin cancer prevention Reviewed appropriate screening tests for age  Also reviewed health mt list, fam hx and immunization status , as well as social and family history   See HPI Labs reviewed prevnar vaccine today  Will call to schedule dexa when ready

## 2014-09-09 NOTE — Assessment & Plan Note (Signed)
Need to increase dose  Inc to 3000 iu daily  Disc imp to general health and bone health

## 2014-09-09 NOTE — Progress Notes (Signed)
Pre visit review using our clinic review tool, if applicable. No additional management support is needed unless otherwise documented below in the visit note. 

## 2014-09-21 ENCOUNTER — Encounter: Payer: Self-pay | Admitting: Family Medicine

## 2014-09-26 ENCOUNTER — Ambulatory Visit: Payer: Self-pay | Admitting: Family Medicine

## 2014-09-26 ENCOUNTER — Encounter: Payer: Self-pay | Admitting: Family Medicine

## 2014-09-27 ENCOUNTER — Encounter: Payer: Self-pay | Admitting: *Deleted

## 2015-02-07 ENCOUNTER — Other Ambulatory Visit: Payer: Self-pay

## 2015-02-07 MED ORDER — ALPRAZOLAM 0.5 MG PO TABS: 0.5000 mg | ORAL_TABLET | Freq: Two times a day (BID) | ORAL | Status: DC | PRN

## 2015-02-07 NOTE — Telephone Encounter (Signed)
Rx called in as prescribed 

## 2015-02-07 NOTE — Telephone Encounter (Signed)
Px written for call in   

## 2015-02-07 NOTE — Telephone Encounter (Signed)
Pt left v/m requesting refill alprazolam to [facility] [address]. Last annual exam 09/09/14 and last printed rx # 180 on 03/11/14.Please advise.

## 2015-05-24 ENCOUNTER — Telehealth: Payer: Self-pay | Admitting: Family Medicine

## 2015-05-24 DIAGNOSIS — G4730 Sleep apnea, unspecified: Secondary | ICD-10-CM

## 2015-05-24 NOTE — Telephone Encounter (Signed)
Pt called wanting to get a referral to a sleep clinic.  She needs a new sleep apnea equipment

## 2015-05-24 NOTE — Telephone Encounter (Signed)
Done  Will route to [place]  

## 2015-05-24 NOTE — Telephone Encounter (Signed)
She would like to go to [place] with whoever is in her network She thinks she went through dr Park Breed last time

## 2015-05-25 NOTE — Telephone Encounter (Signed)
Appt made and patient aware.  

## 2015-06-09 DIAGNOSIS — Z23 Encounter for immunization: Secondary | ICD-10-CM | POA: Diagnosis not present

## 2015-07-05 ENCOUNTER — Ambulatory Visit: Payer: Medicare HMO | Admitting: Internal Medicine

## 2015-08-02 ENCOUNTER — Telehealth: Payer: Self-pay | Admitting: Family Medicine

## 2015-08-02 DIAGNOSIS — G4730 Sleep apnea, unspecified: Secondary | ICD-10-CM

## 2015-08-02 NOTE — Telephone Encounter (Signed)
Who initially ordered her sleep study when she was diagnosed with sleep apnea?   I do not directly order the tests because I do not know which kind to order and I am not trained to titrate cpap settings either Thanks

## 2015-08-02 NOTE — Telephone Encounter (Signed)
Pt called in wanting to set up a new sleep study [facility] the [facility] at [facility].  She was not able to go to the previous appt and called back to reschedule. She was told by them that she needs a new referral for this appt.  She wants to go in Jan 2017 if possible. cb number for pt is [phone] Thank you

## 2015-08-04 ENCOUNTER — Other Ambulatory Visit: Payer: Self-pay | Admitting: Family Medicine

## 2015-08-04 NOTE — Telephone Encounter (Signed)
I would like to make that ref again- ? Ok with her

## 2015-08-04 NOTE — Telephone Encounter (Signed)
I will refer to pulmonary - unsure of avail [place]

## 2015-08-04 NOTE — Telephone Encounter (Signed)
Pt has CPE scheduled for 11/13/15, last refilled on 01/28/15 #180 with 0 refills, please advise

## 2015-08-04 NOTE — Telephone Encounter (Signed)
Rx called in as prescribed 

## 2015-08-04 NOTE — Telephone Encounter (Signed)
You put the referral in back on 05/25/15, but it was for a referral to Pulmo, to discuss sleep study testing, pt never went to that original appt to discuss sleep study with pulmo she cancelled it

## 2015-08-04 NOTE — Telephone Encounter (Signed)
Px written for call in   

## 2015-08-04 NOTE — Telephone Encounter (Signed)
Pt agrees with referral to Pulmo, wants to stay in [place] if possible, pt doesn't want to go to the pulmonologist that's right across from [facility] in the medical buildings, pt didn't know the name of them but she was going to go get her Cpap machine checked years ago and the office was very rude to her so she just said forget it and that's why her cpap machine is really old

## 2015-08-08 ENCOUNTER — Encounter: Payer: Self-pay | Admitting: Internal Medicine

## 2015-09-05 ENCOUNTER — Encounter: Payer: Self-pay | Admitting: Internal Medicine

## 2015-09-12 DIAGNOSIS — G4710 Hypersomnia, unspecified: Secondary | ICD-10-CM | POA: Diagnosis not present

## 2015-09-14 ENCOUNTER — Other Ambulatory Visit: Payer: Self-pay | Admitting: Family Medicine

## 2015-09-16 ENCOUNTER — Other Ambulatory Visit: Payer: Self-pay | Admitting: Family Medicine

## 2015-09-27 ENCOUNTER — Encounter: Payer: Self-pay | Admitting: Gastroenterology

## 2015-09-28 DIAGNOSIS — J069 Acute upper respiratory infection, unspecified: Secondary | ICD-10-CM | POA: Diagnosis not present

## 2015-10-02 ENCOUNTER — Institutional Professional Consult (permissible substitution): Payer: Medicare HMO | Admitting: Internal Medicine

## 2015-10-22 ENCOUNTER — Other Ambulatory Visit: Payer: Self-pay | Admitting: Family Medicine

## 2015-11-05 ENCOUNTER — Telehealth: Payer: Self-pay | Admitting: Family Medicine

## 2015-11-05 DIAGNOSIS — E559 Vitamin D deficiency, unspecified: Secondary | ICD-10-CM

## 2015-11-05 DIAGNOSIS — E785 Hyperlipidemia, unspecified: Secondary | ICD-10-CM

## 2015-11-05 DIAGNOSIS — I10 Essential (primary) hypertension: Secondary | ICD-10-CM

## 2015-11-05 DIAGNOSIS — R739 Hyperglycemia, unspecified: Secondary | ICD-10-CM

## 2015-11-05 NOTE — Telephone Encounter (Signed)
-----   Message from Alvina Chouerri J Walsh sent at 11/01/2015  5:51 PM EST ----- Regarding: Lab orders for Monday, 3.13.17 [facility] lab orders

## 2015-11-06 ENCOUNTER — Ambulatory Visit (INDEPENDENT_AMBULATORY_CARE_PROVIDER_SITE_OTHER): Payer: Medicare HMO

## 2015-11-06 ENCOUNTER — Other Ambulatory Visit (INDEPENDENT_AMBULATORY_CARE_PROVIDER_SITE_OTHER): Payer: Medicare HMO

## 2015-11-06 ENCOUNTER — Telehealth: Payer: Self-pay

## 2015-11-06 VITALS — BP 122/82 | HR 77 | Temp 97.9°F | Ht 63.0 in | Wt 161.5 lb

## 2015-11-06 DIAGNOSIS — Z0000 Encounter for general adult medical examination without abnormal findings: Secondary | ICD-10-CM | POA: Diagnosis not present

## 2015-11-06 DIAGNOSIS — I10 Essential (primary) hypertension: Secondary | ICD-10-CM

## 2015-11-06 DIAGNOSIS — R739 Hyperglycemia, unspecified: Secondary | ICD-10-CM | POA: Diagnosis not present

## 2015-11-06 DIAGNOSIS — E785 Hyperlipidemia, unspecified: Secondary | ICD-10-CM

## 2015-11-06 DIAGNOSIS — E559 Vitamin D deficiency, unspecified: Secondary | ICD-10-CM

## 2015-11-06 DIAGNOSIS — Z1239 Encounter for other screening for malignant neoplasm of breast: Secondary | ICD-10-CM

## 2015-11-06 DIAGNOSIS — Z1159 Encounter for screening for other viral diseases: Secondary | ICD-10-CM

## 2015-11-06 LAB — CBC WITH DIFFERENTIAL/PLATELET
Basophils Absolute: 0 10*3/uL (ref 0.0–0.1)
Basophils Relative: 0.6 % (ref 0.0–3.0)
Eosinophils Absolute: 0.2 10*3/uL (ref 0.0–0.7)
Eosinophils Relative: 2.2 % (ref 0.0–5.0)
HCT: 41.2 % (ref 36.0–46.0)
Hemoglobin: 14 g/dL (ref 12.0–15.0)
Lymphocytes Relative: 29.7 % (ref 12.0–46.0)
Lymphs Abs: 2.2 10*3/uL (ref 0.7–4.0)
MCHC: 34 g/dL (ref 30.0–36.0)
MCV: 89.9 fl (ref 78.0–100.0)
Monocytes Absolute: 0.6 10*3/uL (ref 0.1–1.0)
Monocytes Relative: 7.9 % (ref 3.0–12.0)
Neutro Abs: 4.4 10*3/uL (ref 1.4–7.7)
Neutrophils Relative %: 59.6 % (ref 43.0–77.0)
Platelets: 257 10*3/uL (ref 150.0–400.0)
RBC: 4.59 Mil/uL (ref 3.87–5.11)
RDW: 12.8 % (ref 11.5–15.5)
WBC: 7.4 10*3/uL (ref 4.0–10.5)

## 2015-11-06 LAB — LIPID PANEL
Cholesterol: 174 mg/dL (ref 0–200)
HDL: 57.8 mg/dL (ref >39.00)
LDL Cholesterol: 97 mg/dL (ref 0–99)
NonHDL: 116.45
Total CHOL/HDL Ratio: 3
Triglycerides: 95 mg/dL (ref 0.0–149.0)
VLDL: 19 mg/dL (ref 0.0–40.0)

## 2015-11-06 LAB — COMPREHENSIVE METABOLIC PANEL
ALT: 14 U/L (ref 0–35)
AST: 18 U/L (ref 0–37)
Albumin: 4.3 g/dL (ref 3.5–5.2)
Alkaline Phosphatase: 61 U/L (ref 39–117)
BUN: 10 mg/dL (ref 6–23)
CO2: 30 mEq/L (ref 19–32)
Calcium: 10 mg/dL (ref 8.4–10.5)
Chloride: 99 mEq/L (ref 96–112)
Creatinine, Ser: 0.78 mg/dL (ref 0.40–1.20)
GFR: 78.09 mL/min (ref >60.00)
Glucose, Bld: 100 mg/dL — ABNORMAL HIGH (ref 70–99)
Potassium: 4.1 mEq/L (ref 3.5–5.1)
Sodium: 138 mEq/L (ref 135–145)
Total Bilirubin: 0.4 mg/dL (ref 0.2–1.2)
Total Protein: 7.5 g/dL (ref 6.0–8.3)

## 2015-11-06 LAB — VITAMIN D 25 HYDROXY (VIT D DEFICIENCY, FRACTURES): VITD: 49.64 ng/mL (ref 30.00–100.00)

## 2015-11-06 LAB — HEMOGLOBIN A1C: Hgb A1c MFr Bld: 5.6 % (ref 4.6–6.5)

## 2015-11-06 LAB — TSH: TSH: 1.97 u[IU]/mL (ref 0.35–4.50)

## 2015-11-06 NOTE — Telephone Encounter (Signed)
Notified pt of future appt [facility]. March 29th @ 1040am.

## 2015-11-06 NOTE — Progress Notes (Signed)
Subjective:   Adriana Marks is a 70 y.o. female who presents for Medicare Annual (Subsequent) preventive examination.  Cardiac Risk Factors include: advanced age (>1255men, 30>65 women);dyslipidemia;hypertension     Objective:     Vitals: BP 122/82 mmHg  Pulse 77  Temp(Src) 97.9 F (36.6 C) (Oral)  Ht 5\' 3"  (1.6 m)  Wt 161 lb 8 oz (73.256 kg)  BMI 28.62 kg/m2  SpO2 97%  LMP 08/26/1997  Tobacco History  Smoking status  . Never Smoker   Smokeless tobacco  . Not on file     Counseling given: No   Past Medical History  Diagnosis Date  . History of chicken pox   . Depression   . Bronchitis   . GERD (gastroesophageal reflux disease)   . Allergy   . Heart murmur   . Hypertension   . Hyperlipidemia   . Migraines   . Colon polyps   . Hx: UTI (urinary tract infection)   . Sleep apnea     uses cpap every night   . Rosacea    History reviewed. No pertinent past surgical history. Family History  Problem Relation Age of Onset  . Heart disease Mother   . Heart disease Father   . Hypertension Father   . Stroke Paternal Grandmother   . Heart disease Paternal Grandfather   . Hypertension Paternal Grandfather   . Diabetes Other   . Breast cancer Paternal Aunt    History  Sexual Activity  . Sexual Activity: No    Outpatient Encounter Prescriptions as of 11/06/2015  Medication Sig  . ALPRAZolam (XANAX) 0.5 MG tablet TAKE 1 TABLET BY MOUTH TWICE DAILY AS NEEDED FOR ANXIETY  . aspirin 81 MG tablet Take 81 mg by mouth daily.    . Cholecalciferol (VITAMIN D3) 1000 UNITS CAPS Take 3 capsules by mouth daily.   Marland Kitchen. docusate sodium (COLACE) 100 MG capsule Take 100 mg by mouth daily.   Marland Kitchen. lisinopril-hydrochlorothiazide (PRINZIDE,ZESTORETIC) 20-25 MG tablet TAKE 1 TABLET BY MOUTH EVERY DAY  . omeprazole (PRILOSEC) 20 MG capsule TAKE 1 CAPSULE BY MOUTH TWICE DAILY  . simvastatin (ZOCOR) 20 MG tablet TAKE 1 TABLET BY MOUTH EVERY NIGHT AT BEDTIME  . EPINEPHrine (EPI-PEN) 0.3 mg/0.3  mL SOAJ injection Inject 0.3 mLs (0.3 mg total) into the muscle once. For allergic reaction (Patient not taking: Reported on 11/06/2015)  . LORazepam (ATIVAN) 0.5 MG tablet Take 1 tablet (0.5 mg total) by mouth at bedtime as needed for anxiety. (Patient not taking: Reported on 11/06/2015)  . sertraline (ZOLOFT) 50 MG tablet Take 1 tablet (50 mg total) by mouth daily. (Patient not taking: Reported on 11/06/2015)   No facility-administered encounter medications on file as of 11/06/2015.    Activities of Daily Living In your present state of health, do you have any difficulty performing the following activities: 11/06/2015  Hearing? N  Vision? N  Difficulty concentrating or making decisions? N  Walking or climbing stairs? N  Dressing or bathing? N  Doing errands, shopping? N  Preparing Food and eating ? N  Using the Toilet? N  In the past six months, have you accidently leaked urine? N  Do you have problems with loss of bowel control? N  Managing your Medications? N  Managing your Finances? N  Housekeeping or managing your Housekeeping? N    Patient Care Team: Judy PimpleMarne A [name], MD as PCP - General (Family Medicine)    Assessment:      Hearing Screening          Right ear:   40 40 40 40   Left ear:   40 40 40 40    Vision Screening Comments: Last eye exam Jan 2016    Exercise Activities and Dietary recommendations Current Exercise Habits: Home exercise routine, Type of exercise: strength training/weights;treadmill;calisthenics, Time (Minutes): 60, Frequency (Times/Week): 2, Weekly Exercise (Minutes/Week): 120, Intensity: Moderate  Goals    . emotional health     Starting 11/06/2015, I will spend 1 hour daily doing an activity just for my own personal mental health.         Fall Risk Fall Risk  11/06/2015 09/09/2014 09/06/2013  Falls in the past year? Yes No Yes  Number falls in past yr: 1 - 1  Injury with Fall? No - No  Follow up Falls  evaluation completed - -   Depression Screen PHQ 2/9 Scores 11/06/2015 09/09/2014 09/06/2013  PHQ - 2 Score 4 1 0  PHQ- 9 Score 4 - -     Cognitive Testing MMSE - Mini Mental State Exam 11/06/2015  Orientation to time 5  Orientation to Place 5  Registration 3  Attention/ Calculation 5  Recall 3  Language- name 2 objects 0  Language- repeat 1  Language- follow 3 step command 3  Language- read & follow direction 1  Write a sentence 0  Copy design 0  Total score 26    Immunization History  Administered Date(s) Administered  . Influenza-Unspecified 05/18/2013  . PPD Test 02/02/2013  . Pneumococcal Conjugate-13 09/09/2014  . Pneumococcal-Unspecified 08/27/2012  . Tdap 01/01/2011  . Zoster 08/13/2012   Screening Tests Health Maintenance  Topic Date Due  . MAMMOGRAM  01/25/2016 (Originally 09/27/2015)  . INFLUENZA VACCINE  03/26/2016  . PNA vac Low Risk Adult (2 of 2 - PPSV23) 08/27/2017  . COLONOSCOPY  07/27/2019  . TETANUS/TDAP  12/31/2020  . DEXA SCAN  Completed  . ZOSTAVAX  Completed  . Hepatitis C Screening  Completed      Plan:     I have personally reviewed the Medicare Annual Wellness questionnaire and have noted the following in the patient's chart:  A. Medical and social history B. Use of alcohol, tobacco or illicit drugs  C. Current medications and supplements D. Functional ability and status E.  Nutritional status F.  Physical activity G. Advance directives H. List of other physicians I.  Hospitalizations, surgeries, and ER visits in previous 12 months J.  Vitals K. Screenings to include hearing, vision, cognitive, depression L. Referrals and appointments - mammogram  In addition, I reviewed preventive protocols, quality metrics, and best practice recommendations specific to patient. A written personalized care plan for preventive services as well as general preventive health recommendations were provided to patient.  See attached scanned questionnaire  for additional information.   Signed,   Randa Evens, MHA, BS, LPN Health Advisor 11/06/2015

## 2015-11-06 NOTE — Progress Notes (Signed)
   Subjective:    Patient ID: Adriana Marks, female    DOB: 1946-02-19, 70 y.o.   MRN: [id number]  HPI    Review of Systems     Objective:   Physical Exam        Assessment & Plan:  I reviewed health advisor's note, was available for consultation, and agree with documentation and plan.

## 2015-11-06 NOTE — Progress Notes (Signed)
Pre visit review using our clinic review tool, if applicable. No additional management support is needed unless otherwise documented below in the visit note. 

## 2015-11-06 NOTE — Patient Instructions (Addendum)
Ms. Leinweber , Thank you for taking time to come for your Medicare Wellness Visit. I appreciate your ongoing commitment to your health goals. Please review the following plan we discussed and let me know if I can assist you in the future.   These are the goals we discussed: Goals    Starting 11/06/2015, I will spend 1 hour daily doing an activity just for my own personal mental health.       This is a list of the screening recommended for you and due dates:  Health Maintenance  Topic Date Due  . Mammogram  01/25/2016*  . Flu Shot  03/26/2016  . Pneumonia vaccines (2 of 2 - PPSV23) 08/27/2017  . Colon Cancer Screening  07/27/2019  . Tetanus Vaccine  12/31/2020  . DEXA scan (bone density measurement)  Completed  . Shingles Vaccine  Completed  .  Hepatitis C: One time screening is recommended by Center for Disease Control  (CDC) for  adults born from [birth year] through 1963.   Completed  *Topic was postponed. The date shown is not the original due date.   Preventive Care for Adults  A healthy lifestyle and preventive care can promote health and wellness. Preventive health guidelines for adults include the following key practices.  . A routine yearly physical is a good way to check with your health care provider about your health and preventive screening. It is a chance to share any concerns and updates on your health and to receive a thorough exam.  . Visit your dentist for a routine exam and preventive care every 6 months. Brush your teeth twice a day and floss once a day. Good oral hygiene prevents tooth decay and gum disease.  . The frequency of eye exams is based on your age, health, family medical history, use  of contact lenses, and other factors. Follow your health care provider's ecommendations for frequency of eye exams.  . Eat a healthy diet. Foods like vegetables, fruits, whole grains, low-fat dairy products, and lean protein foods contain the nutrients you need without too many  calories. Decrease your intake of foods high in solid fats, added sugars, and salt. Eat the right amount of calories for you. Get information about a proper diet from your health care provider, if necessary.  . Regular physical exercise is one of the most important things you can do for your health. Most adults should get at least 150 minutes of moderate-intensity exercise (any activity that increases your heart rate and causes you to sweat) each week. In addition, most adults need muscle-strengthening exercises on 2 or more days a week.  Silver Sneakers may be a benefit available to you. To determine eligibility, you may visit the website: www.silversneakers.com or contact program at 626-017-2454 Mon-Fri between 8AM-8PM.   . Maintain a healthy weight. The body mass index (BMI) is a screening tool to identify possible weight problems. It provides an estimate of body fat based on height and weight. Your health care provider can find your BMI and can help you achieve or maintain a healthy weight.   For adults 20 years and older: ? A BMI below 18.5 is considered underweight. ? A BMI of 18.5 to 24.9 is normal. ? A BMI of 25 to 29.9 is considered overweight. ? A BMI of 30 and above is considered obese.   . Maintain normal blood lipids and cholesterol levels by exercising and minimizing your intake of saturated fat. Eat a balanced diet with plenty of fruit and  vegetables. Blood tests for lipids and cholesterol should begin at age 68 and be repeated every 5 years. If your lipid or cholesterol levels are high, you are over 50, or you are at high risk for heart disease, you may need your cholesterol levels checked more frequently. Ongoing high lipid and cholesterol levels should be treated with medicines if diet and exercise are not working.  . If you smoke, find out from your health care provider how to quit. If you do not use tobacco, please do not start.  . If you choose to drink alcohol, please do  not consume more than 2 drinks per day. One drink is considered to be 12 ounces (355 mL) of beer, 5 ounces (148 mL) of wine, or 1.5 ounces (44 mL) of liquor.  . If you are 6655-68 years old, ask your health care provider if you should take aspirin to prevent strokes.  . Use sunscreen. Apply sunscreen liberally and repeatedly throughout the day. You should seek shade when your shadow is shorter than you. Protect yourself by wearing long sleeves, pants, a wide-brimmed hat, and sunglasses year round, whenever you are outdoors.  . Once a month, do a whole body skin exam, using a mirror to look at the skin on your back. Tell your health care provider of new moles, moles that have irregular borders, moles that are larger than a pencil eraser, or moles that have changed in shape or color.

## 2015-11-06 NOTE — Progress Notes (Signed)
Patient concerns:  Emotional health - Pt is still experiencing a sense of loss and grief regarding husband's death. Pt is now concerned about the health of her dogs who getting older. Encouraged patient to attend social events at the local senior citizen center and possibly join [employer]. Depression screening was positive but with no verbalization of suicidal ideation. Pt has been encouraged to share emotional health with PCP at next appt.

## 2015-11-07 ENCOUNTER — Other Ambulatory Visit: Payer: Medicare HMO

## 2015-11-07 LAB — HEPATITIS C ANTIBODY: HCV Ab: NEGATIVE

## 2015-11-13 ENCOUNTER — Ambulatory Visit (INDEPENDENT_AMBULATORY_CARE_PROVIDER_SITE_OTHER): Payer: Medicare HMO | Admitting: Family Medicine

## 2015-11-13 ENCOUNTER — Encounter: Payer: Self-pay | Admitting: Family Medicine

## 2015-11-13 VITALS — BP 130/80 | HR 67 | Temp 98.4°F | Ht 63.0 in | Wt 163.0 lb

## 2015-11-13 DIAGNOSIS — I10 Essential (primary) hypertension: Secondary | ICD-10-CM

## 2015-11-13 DIAGNOSIS — R739 Hyperglycemia, unspecified: Secondary | ICD-10-CM

## 2015-11-13 DIAGNOSIS — M8580 Other specified disorders of bone density and structure, unspecified site: Secondary | ICD-10-CM | POA: Diagnosis not present

## 2015-11-13 DIAGNOSIS — F4321 Adjustment disorder with depressed mood: Secondary | ICD-10-CM

## 2015-11-13 DIAGNOSIS — Z0000 Encounter for general adult medical examination without abnormal findings: Secondary | ICD-10-CM | POA: Diagnosis not present

## 2015-11-13 DIAGNOSIS — E785 Hyperlipidemia, unspecified: Secondary | ICD-10-CM

## 2015-11-13 DIAGNOSIS — R69 Illness, unspecified: Secondary | ICD-10-CM | POA: Diagnosis not present

## 2015-11-13 DIAGNOSIS — E559 Vitamin D deficiency, unspecified: Secondary | ICD-10-CM

## 2015-11-13 MED ORDER — LISINOPRIL-HYDROCHLOROTHIAZIDE 20-25 MG PO TABS: 1.0000 | ORAL_TABLET | Freq: Every day | ORAL | Status: DC

## 2015-11-13 MED ORDER — SIMVASTATIN 20 MG PO TABS: 20.0000 mg | ORAL_TABLET | Freq: Every day | ORAL | Status: DC

## 2015-11-13 MED ORDER — OMEPRAZOLE 20 MG PO CPDR: 20.0000 mg | DELAYED_RELEASE_CAPSULE | Freq: Two times a day (BID) | ORAL | Status: DC

## 2015-11-13 NOTE — Assessment & Plan Note (Signed)
Level is improved in 40s Vitamin D level is therapeutic with current supplementation Disc importance of this to bone and overall health Recommend 5000 iu daily

## 2015-11-13 NOTE — Progress Notes (Signed)
Pre visit review using our clinic review tool, if applicable. No additional management support is needed unless otherwise documented below in the visit note. 

## 2015-11-13 NOTE — Assessment & Plan Note (Signed)
bp in fair control at this time  BP Readings from Last 1 Encounters:  11/13/15 130/80   No changes needed Disc lifstyle change with low sodium diet and exercise   Labs reviewed  

## 2015-11-13 NOTE — Assessment & Plan Note (Signed)
Lab Results  Component Value Date   HGBA1C 5.6 11/06/2015   This is stable Disc wt control and low glycemic diet to prevent DM

## 2015-11-13 NOTE — Assessment & Plan Note (Signed)
Last dexa showed BMD in the normal range -reviewed  One fall No fractures  D level ok in 40s  Rev ca and D intake and need for exercise

## 2015-11-13 NOTE — Progress Notes (Signed)
Subjective:    Patient ID: Adriana Marks, female    DOB: [birth year], 70 y.o.   MRN: [id number]  HPI Here for health maintenance exam and to review chronic medical problems    Reviewed AMW from visit with Virl Axe Some concerns about grief -lost husband in 2015  Has a friend at church that she talks to    [facility] is up 2 lb with bmi of 28 Exercise - [facility] -[facility] she [facility] go - right now helping with her brother (care)  Mammogram 2/16- scheduled for 3/29  Self exam-no lumps  No gyn problems   Colonoscopy 12/10 normal - 10 year recall , no family hx   Nl hep C test   D level is 49 Accidentally took 5000 iu bid  No fractures She had one fall - was on a step stool -forgot what step she was on and fell on the floor  dexa 8/09 normal range (prev hx of osteopenia)  Had a mole on back  Used apple cider vinegar and irritated it - 2 weeks    bp is stable today  No cp or palpitations or headaches or edema  No side effects to medicines  BP Readings from Last 3 Encounters:  11/13/15 136/82  11/06/15 122/82  09/09/14 132/78      Hyperglycemia Lab Results  Component Value Date   HGBA1C 5.6 11/06/2015  (5.7 last time)  Watching her sugar)   Hyperlipidemia Lab Results  Component Value Date   CHOL 174 11/06/2015   CHOL 167 09/02/2014   CHOL 197 08/30/2013   Lab Results  Component Value Date   HDL 57.80 11/06/2015   HDL 47.30 09/02/2014   HDL 49.70 08/30/2013   Lab Results  Component Value Date   LDLCALC 97 11/06/2015   LDLCALC 106* 09/02/2014   LDLCALC 120* 08/30/2013   Lab Results  Component Value Date   TRIG 95.0 11/06/2015   TRIG 71.0 09/02/2014   TRIG 135.0 08/30/2013   Lab Results  Component Value Date   CHOLHDL 3 11/06/2015   CHOLHDL 4 09/02/2014   CHOLHDL 4 08/30/2013   No results found for: LDLDIRECT Very good profile -improved  Diet and exercise   Patient Active Problem List   Diagnosis Date Noted  . Grief  reaction 09/09/2014  . Encounter for Medicare annual wellness exam 09/06/2013  . 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 Hyperglycemia 08/12/2012  . Other screening mammogram 03/22/2011  . Hyperlipidemia 01/01/2011  . Hypertension 01/01/2011  . GERD (gastroesophageal reflux disease) 01/01/2011  . Routine general medical examination at a health care facility 01/01/2011  . Vitamin D deficiency disease 01/01/2011  . Osteopenia 01/01/2011  . Sleep apnea 01/01/2011  . Anxiety 01/01/2011  . Allergic rhinitis 01/01/2011  . Rosacea 01/01/2011   Past Medical History  Diagnosis Date  . History of chicken pox   . Depression   . Bronchitis   . GERD (gastroesophageal reflux disease)   . Allergy   . Heart murmur   . Hypertension   . Hyperlipidemia   . Migraines   . Colon polyps   . Hx: UTI (urinary tract infection)   . Sleep apnea     uses cpap every night   . Rosacea    No past surgical history on file. Social History  Substance Use Topics  . Smoking status: Never Smoker   . Smokeless tobacco: None  . Alcohol Use: 0.0 oz/week  0 Standard drinks or equivalent per week     Comment: wine occasional   Family History  Problem Relation Age of Onset  . Heart disease Mother   . Heart disease Father   . Hypertension Father   . Stroke Paternal Grandmother   . Heart disease Paternal Grandfather   . Hypertension Paternal Grandfather   . Diabetes Other   . Breast cancer Paternal Aunt    Allergies  Allergen Reactions  . Shrimp [Shellfish Allergy]     Sob/ swelling    Current Outpatient Prescriptions on File Prior to Visit  Medication Sig Dispense Refill  . ALPRAZolam (XANAX) 0.5 MG tablet TAKE 1 TABLET BY MOUTH TWICE DAILY AS NEEDED FOR ANXIETY 180 tablet 0  . aspirin 81 MG tablet Take 81 mg by mouth daily.      . Cholecalciferol (VITAMIN D3) 1000 UNITS CAPS Take 3 capsules by mouth daily.     Marland Kitchen docusate sodium (COLACE) 100 MG capsule Take 100 mg by mouth daily.     Marland Kitchen EPINEPHrine  (EPI-PEN) 0.3 mg/0.3 mL SOAJ injection Inject 0.3 mLs (0.3 mg total) into the muscle once. For allergic reaction 1 Device 3  . [DISCONTINUED] atorvastatin (LIPITOR) 10 MG tablet Take 1 tablet (10 mg total) by mouth daily. 30 tablet 11   No current facility-administered medications on file prior to visit.    Review of Systems Review of Systems  Constitutional: Negative for fever, appetite change, fatigue and unexpected weight change.  Eyes: Negative for pain and visual disturbance.  Respiratory: Negative for cough and shortness of breath.   Cardiovascular: Negative for cp or palpitations    Gastrointestinal: Negative for nausea, diarrhea and constipation.  Genitourinary: Negative for urgency and frequency.  Skin: Negative for pallor or rash   MSK pos for aches and pains  Neurological: Negative for weakness, light-headedness, numbness and headaches.  Hematological: Negative for adenopathy. Does not bruise/bleed easily.  Psychiatric/Behavioral: Negative for dysphoric mood. The patient is not nervous/anxious.  pos for grief that is slowly improving        Objective:   Physical Exam  Constitutional: She appears well-developed and well-nourished. No distress.  overwt and well appearing   HENT:  Head: Normocephalic and atraumatic.  Right Ear: External ear normal.  Left Ear: External ear normal.  Mouth/Throat: Oropharynx is clear and moist.  Eyes: Conjunctivae and EOM are normal. Pupils are equal, round, and reactive to light. No scleral icterus.  Neck: Normal range of motion. Neck supple. No JVD present. Carotid bruit is not present. No thyromegaly present.  Cardiovascular: Normal rate, regular rhythm, normal heart sounds and intact distal pulses.  Exam reveals no gallop.   Pulmonary/Chest: Effort normal and breath sounds normal. No respiratory distress. She has no wheezes. She exhibits no tenderness.  Abdominal: Soft. Bowel sounds are normal. She exhibits no distension, no abdominal bruit  and no mass. There is no tenderness.  Genitourinary: No breast swelling, tenderness, discharge or bleeding.  Musculoskeletal: Normal range of motion. She exhibits no edema or tenderness.  Lymphadenopathy:    She has no cervical adenopathy.  Neurological: She is alert. She has normal reflexes. No cranial nerve deficit. She exhibits normal muscle tone. Coordination normal.  Skin: Skin is warm and dry. No rash noted. No erythema. No pallor.  Multiple SKs on trunk /back -some of them flaking off   Lentigines diffusely  Psychiatric: She has a normal mood and affect.          Assessment & Plan:  Problem List Items Addressed This Visit      Cardiovascular and Mediastinum   Hypertension - Primary    bp in fair control at this time  BP Readings from Last 1 Encounters:  11/13/15 130/80   No changes needed Disc lifstyle change with low sodium diet and exercise  Labs reviewed       Relevant Medications   simvastatin (ZOCOR) 20 MG tablet   lisinopril-hydrochlorothiazide (PRINZIDE,ZESTORETIC) 20-25 MG tablet     Musculoskeletal and Integument   Osteopenia    Last dexa showed BMD in the normal range -reviewed  One fall No fractures  D level ok in 40s  Rev ca and D intake and need for exercise          Other   Grief reaction    Declines counseling- thinks she is doing ok  Reviewed stressors/ coping techniques/symptoms/ support sources/ tx options and side effects in detail today       Hyperglycemia    Lab Results  Component Value Date   HGBA1C 5.6 11/06/2015   This is stable Disc wt control and low glycemic diet to prevent DM      Hyperlipidemia    Improved profile Disc goals for lipids and reasons to control them Rev labs with pt Rev low sat fat diet in detail       Relevant Medications   simvastatin (ZOCOR) 20 MG tablet   lisinopril-hydrochlorothiazide (PRINZIDE,ZESTORETIC) 20-25 MG tablet   Routine general medical examination at a health care facility     Reviewed health habits including diet and exercise and skin cancer prevention Reviewed appropriate screening tests for age  Also reviewed health mt list, fam hx and immunization status , as well as social and family history   See HPI AMW visit reviewed Labs reviewed Take vitamin D 5000 iu once daily  Blood pressure is ok  Get back to exercise when you can  Eat a healthy diet  Don't forget to get your mammogram later this month       Vitamin D deficiency disease    Level is improved in 40s Vitamin D level is therapeutic with current supplementation Disc importance of this to bone and overall health Recommend 5000 iu daily

## 2015-11-13 NOTE — Assessment & Plan Note (Signed)
Declines counseling- thinks she is doing ok  Reviewed stressors/ coping techniques/symptoms/ support sources/ tx options and side effects in detail today

## 2015-11-13 NOTE — Patient Instructions (Addendum)
Take vitamin D 5000 iu once daily  Blood pressure is ok  Get back to exercise when you can  Eat a healthy diet  Don't forget to get your mammogram later this month

## 2015-11-13 NOTE — Assessment & Plan Note (Signed)
Reviewed health habits including diet and exercise and skin cancer prevention Reviewed appropriate screening tests for age  Also reviewed health mt list, fam hx and immunization status , as well as social and family history   See HPI AMW visit reviewed Labs reviewed Take vitamin D 5000 iu once daily  Blood pressure is ok  Get back to exercise when you can  Eat a healthy diet  Don't forget to get your mammogram later this month

## 2015-11-13 NOTE — Assessment & Plan Note (Signed)
Improved profile Disc goals for lipids and reasons to control them Rev labs with pt Rev low sat fat diet in detail

## 2015-11-22 ENCOUNTER — Ambulatory Visit
Admission: RE | Admit: 2015-11-22 | Discharge: 2015-11-22 | Disposition: A | Discharge status: Home / Self Care | Payer: Medicare HMO | Source: Ambulatory Visit | Attending: Family Medicine | Admitting: Family Medicine

## 2015-11-22 ENCOUNTER — Other Ambulatory Visit: Payer: Self-pay | Admitting: Family Medicine

## 2015-11-22 DIAGNOSIS — Z1239 Encounter for other screening for malignant neoplasm of breast: Secondary | ICD-10-CM

## 2015-11-22 DIAGNOSIS — Z1231 Encounter for screening mammogram for malignant neoplasm of breast: Secondary | ICD-10-CM | POA: Diagnosis not present

## 2015-11-22 LAB — HM MAMMOGRAPHY: HM Mammogram: NORMAL (ref 0–4)

## 2015-11-24 ENCOUNTER — Encounter: Payer: Self-pay | Admitting: *Deleted

## 2015-11-30 ENCOUNTER — Encounter: Payer: Self-pay | Admitting: Family Medicine

## 2015-11-30 ENCOUNTER — Other Ambulatory Visit: Payer: Self-pay | Admitting: Family Medicine

## 2015-12-01 NOTE — Telephone Encounter (Signed)
Pt had CPE on 11/13/15, and has her next year's CPE scheduled too. Last refilled on 08/04/15 #180 with 0 refills, please advise

## 2015-12-01 NOTE — Telephone Encounter (Signed)
Px written for call in   

## 2015-12-01 NOTE — Telephone Encounter (Signed)
Rx called in as prescribed 

## 2015-12-16 ENCOUNTER — Other Ambulatory Visit: Payer: Self-pay | Admitting: Family Medicine

## 2016-01-22 ENCOUNTER — Other Ambulatory Visit: Payer: Self-pay | Admitting: Family Medicine

## 2016-03-09 ENCOUNTER — Other Ambulatory Visit: Payer: Self-pay | Admitting: Family Medicine

## 2016-03-11 NOTE — Telephone Encounter (Signed)
Px written for call in   

## 2016-03-11 NOTE — Telephone Encounter (Signed)
Rx called to pharmacy as instructed. 

## 2016-03-11 NOTE — Telephone Encounter (Signed)
Received refill request electronically Last refill 12/01/15 #180 Last office visit 11/13/15

## 2016-04-08 DIAGNOSIS — R69 Illness, unspecified: Secondary | ICD-10-CM | POA: Diagnosis not present

## 2016-04-10 ENCOUNTER — Telehealth: Payer: Self-pay | Admitting: Family Medicine

## 2016-04-10 DIAGNOSIS — R69 Illness, unspecified: Secondary | ICD-10-CM | POA: Diagnosis not present

## 2016-04-10 NOTE — Telephone Encounter (Signed)
Pt has appt with Dr Dayton MartesAron on 04/11/16 at 8:15.

## 2016-04-10 NOTE — Telephone Encounter (Signed)
[facility] Client TELEPHONE ADVICE RECORD [facility] Patient Name: Adriana FillersCATHY Marks DOB: February 21, 1946 Initial Comment Caller states she is having bladder and kidney pain. Did pass something, possibly kidney stone. Nurse Assessment Nurse: Debera Latalston, RN, Tinnie GensJeffrey Date/Time Lamount Cohen(Eastern Time): 04/10/2016 12:03:05 PM Confirm and document reason for call. If symptomatic, describe symptoms. You must click the next button to save text entered. ---Caller states she is having bladder and kidney pain. Did pass something, possibly kidney stone. Looked like a small pebble. Has had symptoms for 2 weeks. No fever. No blood in urine increase frequency. Flank pain has lessened overnight. Has the patient traveled out of the country within the last 30 days? ---No Does the patient have any new or worsening symptoms? ---Yes Will a triage be completed? ---Yes Related visit to physician within the last 2 weeks? ---No Does the PT have any chronic conditions? (i.e. diabetes, asthma, etc.) ---Yes List chronic conditions. ---HTN Is this a behavioral health or substance abuse call? ---No Guidelines Guideline Title Affirmed Question Affirmed Notes Flank Pain Pain radiates into groin, scrotum Final Disposition User See Physician within 24 Hours Debera Latalston, RN, Abbott LaboratoriesJeffrey Referrals REFERRED TO PCP OFFICE Disagree/Comply: Danella Maiersomply

## 2016-04-11 ENCOUNTER — Ambulatory Visit (INDEPENDENT_AMBULATORY_CARE_PROVIDER_SITE_OTHER): Payer: Medicare HMO | Admitting: Family Medicine

## 2016-04-11 ENCOUNTER — Encounter: Payer: Self-pay | Admitting: Family Medicine

## 2016-04-11 VITALS — BP 162/92 | HR 69 | Temp 97.8°F | Wt 159.8 lb

## 2016-04-11 DIAGNOSIS — R300 Dysuria: Secondary | ICD-10-CM | POA: Diagnosis not present

## 2016-04-11 LAB — POC URINALSYSI DIPSTICK (AUTOMATED)
Bilirubin, UA: NEGATIVE
Blood, UA: NEGATIVE
Glucose, UA: NEGATIVE
Ketones, UA: NEGATIVE
Leukocytes, UA: NEGATIVE
Nitrite, UA: NEGATIVE
Protein, UA: NEGATIVE
Spec Grav, UA: 1.015
Urobilinogen, UA: 0.2
pH, UA: 7.5

## 2016-04-11 NOTE — Patient Instructions (Signed)
Nice to meet you.   Frequency may be due to bladder irritants... Drink water, avoid alcohol, caffeine, soda, citris, tomato, spicy foods.  Follow up with Dr. Milinda Antisower if symptoms continue.

## 2016-04-11 NOTE — Progress Notes (Signed)
Pre visit review using our clinic review tool, if applicable. No additional management support is needed unless otherwise documented below in the visit note. 

## 2016-04-11 NOTE — Progress Notes (Signed)
SUBJECTIVE: Adriana Marks is a 70 y.o. female pt of Dr. Milinda Antisower, new to me, who complains of urinary frequency, urgency without dysuria x 14 days, without flank pain, fever, chills, or abnormal vaginal discharge or bleeding.   Current Outpatient Prescriptions on File Prior to Visit  Medication Sig Dispense Refill  . ALPRAZolam (XANAX) 0.5 MG tablet TAKE 1 TABLET BY MOUTH TWICE DAILY AS NEEDED FOR ANXIETY 180 tablet 0  . aspirin 81 MG tablet Take 81 mg by mouth daily.      . Cholecalciferol (VITAMIN D3) 1000 UNITS CAPS Take 5,000 capsules by mouth daily.     Marland Kitchen. docusate sodium (COLACE) 100 MG capsule Take 100 mg by mouth daily.     Marland Kitchen. EPINEPHrine (EPI-PEN) 0.3 mg/0.3 mL SOAJ injection Inject 0.3 mLs (0.3 mg total) into the muscle once. For allergic reaction 1 Device 3  . lisinopril-hydrochlorothiazide (PRINZIDE,ZESTORETIC) 20-25 MG tablet Take 1 tablet by mouth daily. 90 tablet 3  . omeprazole (PRILOSEC) 20 MG capsule Take 1 capsule (20 mg total) by mouth 2 (two) times daily. 180 capsule 3  . simvastatin (ZOCOR) 20 MG tablet Take 1 tablet (20 mg total) by mouth at bedtime. 90 tablet 3  . [DISCONTINUED] atorvastatin (LIPITOR) 10 MG tablet Take 1 tablet (10 mg total) by mouth daily. 30 tablet 11   No current facility-administered medications on file prior to visit.     Allergies  Allergen Reactions  . Shrimp [Shellfish Allergy]     Sob/ swelling     Past Medical History:  Diagnosis Date  . Allergy   . Bronchitis   . Colon polyps   . Depression   . GERD (gastroesophageal reflux disease)   . Heart murmur   . History of chicken pox   . Hx: UTI (urinary tract infection)   . Hyperlipidemia   . Hypertension   . Migraines   . Rosacea   . Sleep apnea    uses cpap every night     No past surgical history on file.  Family History  Problem Relation Age of Onset  . Heart disease Mother   . Heart disease Father   . Hypertension Father   . Stroke Paternal Grandmother   . Heart disease  Paternal Grandfather   . Hypertension Paternal Grandfather   . Diabetes Other   . Breast cancer Paternal Aunt     Social History   Social History  . Marital status: Widowed    Spouse name: N/A  . Number of children: N/A  . Years of education: N/A   Occupational History  . Futures traderbank teller [employer]   Social History Main Topics  . Smoking status: Never Smoker  . Smokeless tobacco: Not on file  . Alcohol use 0.0 oz/week     Comment: wine occasional  . Drug use: No  . Sexual activity: No   Other Topics Concern  . 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  OBJECTIVE:  BP (!) 162/92   Pulse 69   Temp 97.8 F (36.6 C) (Oral)   Wt 159 lb 12 oz (72.5 kg)   LMP 08/26/1997   SpO2 98%   BMI 28.30 kg/m   Appears well, in no apparent distress.  Vital signs are normal. The abdomen is soft without tenderness, guarding, mass, rebound or organomegaly. No CVA tenderness or inguinal adenopathy noted. Urine dipstick shows negative for all components.    ASSESSMENT: Bladder irritation without signs of infection  PLAN:  Discussed what can cause urinary frequency- See AVS.  Call or return to clinic prn if these symptoms worsen or fail to improve as anticipated.

## 2016-04-24 ENCOUNTER — Telehealth: Payer: Self-pay | Admitting: Family Medicine

## 2016-04-24 ENCOUNTER — Ambulatory Visit (INDEPENDENT_AMBULATORY_CARE_PROVIDER_SITE_OTHER): Payer: Medicare HMO

## 2016-04-24 DIAGNOSIS — Z111 Encounter for screening for respiratory tuberculosis: Secondary | ICD-10-CM | POA: Diagnosis not present

## 2016-04-24 NOTE — Telephone Encounter (Signed)
Aware-will sign when we get result

## 2016-04-24 NOTE — Telephone Encounter (Signed)
Pt has tb test done today 04/24/16.  She dropped off paperwork to be filled out in dr towers in box  Pt will be back Friday to have tb test read

## 2016-04-26 LAB — TB SKIN TEST
Induration: 0 mm
TB Skin Test: NEGATIVE

## 2016-05-25 DIAGNOSIS — R69 Illness, unspecified: Secondary | ICD-10-CM | POA: Diagnosis not present

## 2016-06-17 ENCOUNTER — Other Ambulatory Visit: Payer: Self-pay | Admitting: Family Medicine

## 2016-06-18 NOTE — Telephone Encounter (Signed)
Rx called in as prescribed 

## 2016-06-18 NOTE — Telephone Encounter (Signed)
Pt has CPE scheduled on 11/13/16, last filled 03/11/16 #180 tablets with 0 refills, please advise

## 2016-06-18 NOTE — Telephone Encounter (Signed)
Px written for call in   

## 2016-07-02 ENCOUNTER — Telehealth: Payer: Self-pay | Admitting: Family Medicine

## 2016-07-02 DIAGNOSIS — G4730 Sleep apnea, unspecified: Secondary | ICD-10-CM

## 2016-07-02 NOTE — Telephone Encounter (Signed)
Left voicemail requesting pt to call the office back 

## 2016-07-02 NOTE — Telephone Encounter (Signed)
For what diagnosis?  Thanks

## 2016-07-02 NOTE — Telephone Encounter (Signed)
Pt needs a referral to see Dr Welton FlakesKhan on [address] in [place].  She has an appt for November 30.  Call back number for pt is [phone]

## 2016-07-03 NOTE — Telephone Encounter (Signed)
Patient returned Shapale's call. Patient can be reached at [phone] until 2:00.

## 2016-07-03 NOTE — Telephone Encounter (Signed)
It's for sleep apnea, pt saw Dr. Welton FlakesKhan years ago and he did a sleep study that showed she had sleep apnea and prescribed her a cpap machine. Pt said the machine got old and doesn't work and for the last 2 yrs she hasn't been using one and she can tell a difference so she wants to go back to him to get re-eval for her sleep apnea to get a new cpap machine

## 2016-07-03 NOTE — Telephone Encounter (Signed)
Ref done  Will route to [facility] 

## 2016-07-09 NOTE — Telephone Encounter (Signed)
Patient scheduled her own appt with Dr Welton FlakesKhan.

## 2016-08-04 ENCOUNTER — Encounter: Payer: Self-pay | Admitting: Gynecology

## 2016-08-04 ENCOUNTER — Ambulatory Visit (INDEPENDENT_AMBULATORY_CARE_PROVIDER_SITE_OTHER): Payer: Medicare HMO

## 2016-08-04 ENCOUNTER — Ambulatory Visit
Admission: EM | Admit: 2016-08-04 | Discharge: 2016-08-04 | Disposition: A | Discharge status: Home / Self Care | Payer: Medicare HMO | Attending: Family Medicine | Admitting: Family Medicine

## 2016-08-04 DIAGNOSIS — J189 Pneumonia, unspecified organism: Secondary | ICD-10-CM

## 2016-08-04 DIAGNOSIS — R05 Cough: Secondary | ICD-10-CM | POA: Diagnosis not present

## 2016-08-04 MED ORDER — LEVOFLOXACIN 500 MG PO TABS: 500.0000 mg | ORAL_TABLET | Freq: Every day | ORAL | 0 refills | Status: DC

## 2016-08-04 MED ORDER — PREDNISONE 10 MG (21) PO TBPK: 10.0000 mg | ORAL_TABLET | Freq: Every day | ORAL | 0 refills | Status: DC

## 2016-08-04 NOTE — ED Provider Notes (Signed)
CSN: [id number]     Arrival date & time 08/04/16  1014 History   First MD Initiated Contact with Patient 08/04/16 1131     Chief Complaint  Patient presents with  . Cough   (Consider location/radiation/quality/duration/timing/severity/associated sxs/prior Treatment) Pt in for cough x2 weeks productive yellow phlem. States that she feels as it is getting worse. Minimal post nasal drip. Has been treating a fever at home with tylenol and motrin. HX of chronic bronchitis. Does use a cpap nightly.       Past Medical History:  Diagnosis Date  . Allergy   . Bronchitis   . Colon polyps   . Depression   . GERD (gastroesophageal reflux disease)   . Heart murmur   . History of chicken pox   . Hx: UTI (urinary tract infection)   . Hyperlipidemia   . Hypertension   . Migraines   . Rosacea   . Sleep apnea    uses cpap every night    History reviewed. No pertinent surgical history. Family History  Problem Relation Age of Onset  . Heart disease Father   . Hypertension Father   . Diabetes Other   . Heart disease Mother   . Stroke Paternal Grandmother   . Heart disease Paternal Grandfather   . Hypertension Paternal Grandfather   . Breast cancer Paternal Aunt    Social History  Substance Use Topics  . Smoking status: Never Smoker  . Smokeless tobacco: Never Used  . Alcohol use 0.0 oz/week     Comment: wine occasional   OB History    No data available     Review of Systems  Constitutional: Positive for fatigue and fever.  HENT: Positive for congestion, postnasal drip and rhinorrhea.   Eyes: Negative.   Respiratory: Positive for cough and shortness of breath.   Cardiovascular: Negative.   Skin: Negative.   Neurological: Negative.     Allergies  Shrimp [shellfish allergy]  Home Medications   Prior to Admission medications   Medication Sig Start Date End Date Taking? Authorizing Provider  ALPRAZolam Prudy Feeler(XANAX) 0.5 MG tablet TAKE 1 TABLET BY MOUTH TWICE DAILY AS NEEDED  FOR ANXIETY 06/18/16  Yes Judy PimpleMarne A [name], MD  aspirin 81 MG tablet Take 81 mg by mouth daily.     Yes Historical Provider, MD  docusate sodium (COLACE) 100 MG capsule Take 100 mg by mouth daily.    Yes Historical Provider, MD  EPINEPHrine (EPI-PEN) 0.3 mg/0.3 mL SOAJ injection Inject 0.3 mLs (0.3 mg total) into the muscle once. For allergic reaction 04/27/13  Yes Judy PimpleMarne A [name], MD  lisinopril-hydrochlorothiazide (PRINZIDE,ZESTORETIC) 20-25 MG tablet Take 1 tablet by mouth daily. 11/13/15  Yes Judy PimpleMarne A [name], MD  omeprazole (PRILOSEC) 20 MG capsule Take 1 capsule (20 mg total) by mouth 2 (two) times daily. 11/13/15  Yes Judy PimpleMarne A [name], MD  simvastatin (ZOCOR) 20 MG tablet Take 1 tablet (20 mg total) by mouth at bedtime. 11/13/15  Yes Judy PimpleMarne A [name], MD  Cholecalciferol (VITAMIN D3) 1000 UNITS CAPS Take 5,000 capsules by mouth daily.     Historical Provider, MD  levofloxacin (LEVAQUIN) 500 MG tablet Take 1 tablet (500 mg total) by mouth daily. 08/04/16   Tobi BastosMelanie A Quintrell Baze, NP  predniSONE (STERAPRED UNI-PAK 21 TAB) 10 MG (21) TBPK tablet Take 1 tablet (10 mg total) by mouth daily. Take 6 tabs by mouth daily  for 2 days, then 5 tabs for 2 days, then 4 tabs for 2 days, then 3 tabs  for 2 days, 2 tabs for 2 days, then 1 tab by mouth daily for 2 days 08/04/16   Tobi BastosMelanie A Oneil Behney, NP   Meds Ordered and Administered this Visit  Medications - No data to display  BP 139/77 (BP Location: Left Arm)   Pulse 89   Temp 99.4 F (37.4 C) (Oral)   Resp 16   Wt 150 lb (68 kg)   LMP 08/26/1997   SpO2 98%   BMI 26.57 kg/m  No data found.   Physical Exam  Constitutional: She appears well-developed.  HENT:  Head: Normocephalic.  Eyes: Pupils are equal, round, and reactive to light.  Neck: Normal range of motion.  Cardiovascular: Normal rate and regular rhythm.   Pulmonary/Chest:  Productive cough,sob, diminished bs to lower base bil  Abdominal: Soft. Bowel sounds are normal.  Neurological: She is alert.  Skin:  Skin is warm and dry. Capillary refill takes less than 2 seconds.    Urgent Care Course   Clinical Course     Procedures (including critical care time)  Labs Review Labs Reviewed - No data to display  Imaging Review Dg Chest 2 View  Result Date: 08/04/2016 CLINICAL DATA:  Cough. EXAM: CHEST  2 VIEW COMPARISON:  October 24, 2010 FINDINGS: Mild infiltrate seen in the right lateral lung base. No other interval changes or acute abnormalities. IMPRESSION: Mild infiltrate in the right lung base. Recommend follow-up to complete resolution. Electronically Signed   By: Gerome Samavid  Williams III M.D   On: 08/04/2016 12:05             MDM   1. Community acquired pneumonia, unspecified laterality    Discussed with MD wade pts x ray results informed pt that it appears she has pneumonia but will need follow up for resolution and to ensure there is no other reason for the sob such as mass.  Pt is to follow up with pcp within the next few days  Take full does of abx Drink plenty of fluids Pt states that she has not been wearing her cpap educated pt that she needs to wear this to help with sob. Make sure the equipment is cleaned.  May use a humidifier to help and nasal spray for post nasal drip.  Take motrin or tylenol as needed for fever. Go to the ER if sx worsen begin having chest pain or increase sob.     Tobi BastosMelanie A Mykle Pascua, NP 08/05/16 [phone]

## 2016-08-04 NOTE — ED Triage Notes (Signed)
Patient c/o cough and congestion. Per patient fever of 100.9 this morning at home.

## 2016-08-04 NOTE — Discharge Instructions (Signed)
Discussed that you will need a follow up for a repeat chest x ray and possible ct scan of lungs. Take full dose of abx

## 2016-08-06 ENCOUNTER — Encounter: Payer: Self-pay | Admitting: Family Medicine

## 2016-08-09 ENCOUNTER — Encounter: Payer: Self-pay | Admitting: Family Medicine

## 2016-08-09 ENCOUNTER — Ambulatory Visit (INDEPENDENT_AMBULATORY_CARE_PROVIDER_SITE_OTHER)
Admission: RE | Admit: 2016-08-09 | Discharge: 2016-08-09 | Disposition: A | Discharge status: Home / Self Care | Payer: Medicare HMO | Source: Ambulatory Visit | Attending: Family Medicine | Admitting: Family Medicine

## 2016-08-09 ENCOUNTER — Encounter: Payer: Self-pay | Admitting: Radiology

## 2016-08-09 ENCOUNTER — Ambulatory Visit (INDEPENDENT_AMBULATORY_CARE_PROVIDER_SITE_OTHER): Payer: Medicare HMO | Admitting: Family Medicine

## 2016-08-09 VITALS — BP 142/88 | HR 78 | Temp 98.6°F | Ht 63.0 in | Wt 155.5 lb

## 2016-08-09 DIAGNOSIS — Z79899 Other long term (current) drug therapy: Secondary | ICD-10-CM | POA: Diagnosis not present

## 2016-08-09 DIAGNOSIS — R918 Other nonspecific abnormal finding of lung field: Secondary | ICD-10-CM | POA: Insufficient documentation

## 2016-08-09 DIAGNOSIS — G4730 Sleep apnea, unspecified: Secondary | ICD-10-CM | POA: Diagnosis not present

## 2016-08-09 NOTE — Patient Instructions (Addendum)
I'm glad your symptoms are improved Take care of yourself  Stop at check out for referral to pulmonary   We will alert you when you get a chest xray reading

## 2016-08-09 NOTE — Assessment & Plan Note (Signed)
Her f/u had to be re scheduled  She pref [place]  Will do ref for this

## 2016-08-09 NOTE — Progress Notes (Signed)
Subjective:    Patient ID: Adriana Marks, female    DOB: 1946-05-22, 70 y.o.   MRN: [id number]  HPI Here for f/u of R infiltrate on cxr and also to request sleep clinic appt   She was dx with CAP at [facility] on 12/10 by a NP Presented with prod cough for 2 wk that was worsening as well as a fever  She feels a lot better with abx and prednisone  Is back to work now   Had had a GI virus twice before this   CXR: Dg Chest 2 View  Result Date: 08/04/2016 CLINICAL DATA:  Cough. EXAM: CHEST  2 VIEW COMPARISON:  October 24, 2010 FINDINGS: Mild infiltrate seen in the right lateral lung base. No other interval changes or acute abnormalities. IMPRESSION: Mild infiltrate in the right lung base. Recommend follow-up to complete resolution. Electronically Signed   By: Gerome Sam III M.D   On: 08/04/2016 12:05      she was treated with levaquin and steri pred pack    Hx of HTN and hyperglycemia  utd on imms  tx for sleep apnea   Per pt was told she has a lung nodule   Due for her UDS  Last xanax dose was this am  It works well for her   She has been diag with sleep apnea in the past Was not using her cpap regularly  Re scheduled with pulm sleep clinic but they had to cancel unexpectedly  Is interested in change to the [facility] office   Patient Active Problem List   Diagnosis Date Noted  . Infiltrate of lung present on imaging of chest 08/09/2016  . Grief reaction 09/09/2014  . Encounter for Medicare annual wellness exam 09/06/2013  . 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 Hyperglycemia 08/12/2012  . Other screening mammogram 03/22/2011  . Hyperlipidemia 01/01/2011  . Hypertension 01/01/2011  . GERD (gastroesophageal reflux disease) 01/01/2011  . Routine general medical examination at a health care facility 01/01/2011  . Vitamin D deficiency disease 01/01/2011  . Osteopenia 01/01/2011  . Sleep apnea 01/01/2011  . Anxiety 01/01/2011  . Allergic rhinitis 01/01/2011  . Rosacea  01/01/2011   Past Medical History:  Diagnosis Date  . Allergy   . Bronchitis   . Colon polyps   . Depression   . GERD (gastroesophageal reflux disease)   . Heart murmur   . History of chicken pox   . Hx: UTI (urinary tract infection)   . Hyperlipidemia   . Hypertension   . Migraines   . Rosacea   . Sleep apnea    uses cpap every night    No past surgical history on file. Social History  Substance Use Topics  . Smoking status: Never Smoker  . Smokeless tobacco: Never Used  . Alcohol use 0.0 oz/week     Comment: wine occasional   Family History  Problem Relation Age of Onset  . Heart disease Father   . Hypertension Father   . Diabetes Other   . Heart disease Mother   . Stroke Paternal Grandmother   . Heart disease Paternal Grandfather   . Hypertension Paternal Grandfather   . Breast cancer Paternal Aunt    Allergies  Allergen Reactions  . Shrimp [Shellfish Allergy]     Sob/ swelling    Current Outpatient Prescriptions on File Prior to Visit  Medication Sig Dispense Refill  . ALPRAZolam (XANAX) 0.5 MG tablet TAKE 1 TABLET BY MOUTH TWICE DAILY AS NEEDED  FOR ANXIETY 180 tablet 0  . aspirin 81 MG tablet Take 81 mg by mouth daily.      . Cholecalciferol (VITAMIN D3) 1000 UNITS CAPS Take 5,000 capsules by mouth daily.     Marland Kitchen. docusate sodium (COLACE) 100 MG capsule Take 100 mg by mouth daily.     Marland Kitchen. EPINEPHrine (EPI-PEN) 0.3 mg/0.3 mL SOAJ injection Inject 0.3 mLs (0.3 mg total) into the muscle once. For allergic reaction 1 Device 3  . levofloxacin (LEVAQUIN) 500 MG tablet Take 1 tablet (500 mg total) by mouth daily. 7 tablet 0  . lisinopril-hydrochlorothiazide (PRINZIDE,ZESTORETIC) 20-25 MG tablet Take 1 tablet by mouth daily. 90 tablet 3  . omeprazole (PRILOSEC) 20 MG capsule Take 1 capsule (20 mg total) by mouth 2 (two) times daily. 180 capsule 3  . predniSONE (STERAPRED UNI-PAK 21 TAB) 10 MG (21) TBPK tablet Take 1 tablet (10 mg total) by mouth daily. Take 6 tabs by  mouth daily  for 2 days, then 5 tabs for 2 days, then 4 tabs for 2 days, then 3 tabs for 2 days, 2 tabs for 2 days, then 1 tab by mouth daily for 2 days 42 tablet 0  . simvastatin (ZOCOR) 20 MG tablet Take 1 tablet (20 mg total) by mouth at bedtime. 90 tablet 3  . [DISCONTINUED] atorvastatin (LIPITOR) 10 MG tablet Take 1 tablet (10 mg total) by mouth daily. 30 tablet 11   No current facility-administered medications on file prior to visit.      Review of Systems Review of Systems  Constitutional: Negative for fever, appetite change,  and unexpected weight change. pos for poor sleep and day time somnolence  Eyes: Negative for pain and visual disturbance.  Respiratory: Negative for wheeze and shortness of breath.  pos for cough that is much improved  Cardiovascular: Negative for cp or palpitations    Gastrointestinal: Negative for nausea, diarrhea and constipation.  Genitourinary: Negative for urgency and frequency.  Skin: Negative for pallor or rash   Neurological: Negative for weakness, light-headedness, numbness and headaches.  Hematological: Negative for adenopathy. Does not bruise/bleed easily.  Psychiatric/Behavioral: Negative for dysphoric mood. The patient is not nervous/anxious.         Objective:   Physical Exam  Constitutional: She appears well-developed and well-nourished. No distress.  HENT:  Head: Normocephalic and atraumatic.  Mouth/Throat: Oropharynx is clear and moist. No oropharyngeal exudate.  Nares are boggy  Eyes: Conjunctivae and EOM are normal. Pupils are equal, round, and reactive to light. Right eye exhibits no discharge. Left eye exhibits no discharge.  Neck: Normal range of motion. Neck supple.  Cardiovascular: Normal rate, regular rhythm and normal heart sounds.   Pulmonary/Chest: Effort normal and breath sounds normal. No respiratory distress. She has no wheezes. She has no rales. She exhibits no tenderness.  Good air exch  No wheeze even on forced  expiration  Musculoskeletal: She exhibits no edema.  Lymphadenopathy:    She has no cervical adenopathy.  Neurological: She is alert.  Skin: Skin is warm and dry. No rash noted. No erythema. No pallor.  Psychiatric: She has a normal mood and affect.          Assessment & Plan:   Problem List Items Addressed This Visit      Respiratory   Sleep apnea - Primary    Her f/u had to be re scheduled  She pref [place]  Will do ref for this       Relevant Orders  Ambulatory referral to Pulmonology   Infiltrate of lung present on imaging of chest    Mild infiltrate seen on the 10th- rev with pt  The NP she saw suspected a nodule as well  cxr repeated today-rev with pt and pend radiol review  Much clinical improvement Will finish abx and prednisone      Relevant Orders   DG Chest 2 View (Completed)

## 2016-08-09 NOTE — Progress Notes (Signed)
Pre visit review using our clinic review tool, if applicable. No additional management support is needed unless otherwise documented below in the visit note. 

## 2016-08-09 NOTE — Assessment & Plan Note (Signed)
Mild infiltrate seen on the 10th- rev with pt  The NP she saw suspected a nodule as well  cxr repeated today-rev with pt and pend radiol review  Much clinical improvement Will finish abx and prednisone

## 2016-09-04 ENCOUNTER — Ambulatory Visit (INDEPENDENT_AMBULATORY_CARE_PROVIDER_SITE_OTHER): Payer: Medicare HMO | Admitting: Internal Medicine

## 2016-09-04 ENCOUNTER — Encounter: Payer: Self-pay | Admitting: Internal Medicine

## 2016-09-04 VITALS — BP 122/78 | HR 78 | Wt 159.0 lb

## 2016-09-04 DIAGNOSIS — G4719 Other hypersomnia: Secondary | ICD-10-CM

## 2016-09-04 DIAGNOSIS — G4730 Sleep apnea, unspecified: Secondary | ICD-10-CM | POA: Diagnosis not present

## 2016-09-04 NOTE — Patient Instructions (Signed)

## 2016-09-04 NOTE — Progress Notes (Signed)
[facility] [place] Pulmonary Medicine Consultation      Date: 09/04/2016,   MRN# [id number] EISHA CHATTERJEE [birth year] Code Status:  Code Status History    This patient does not have a recorded code status. Please follow your organizational policy for patients in this situation.     Hosp day:@LENGTHOFSTAYDAYS @ Referring MD: @ATDPROV @     PCP:      Admission                  Current  Adriana Marks is a 71 y.o. old female seen in consultation for at the request of Dr. Milinda Antis.     CHIEF COMPLAINT:   I have sleep apnea   HISTORY OF PRESENT ILLNESS  71 yo white female seen today for sleep apnea seen today for problems with sleep Patient  has been having sleep problems for over 12 years Patient has been having excessive daytime sleepiness Patient has been having extreme fatigue and tiredness, lack of energy Patient has been told that she has very  Loud snoring every night Patient has been told that she struggles to breathe at night and gasps for air  Has been dx with OSA 12 years ago Did NOT use CPAP nasal mask with chin straps for over 2 years because of dirty machine and tubing Her tiredness and fatigue has been getting worse over time n the past 1 year  She also has h/o pneumonia 1 month ago-given levaquin and prednisone, feels much better since then She is retired but works at [employer]   PAST MEDICAL HISTORY   Past Medical History:  Diagnosis Date  . Allergy   . Bronchitis   . Colon polyps   . Depression   . GERD (gastroesophageal reflux disease)   . Heart murmur   . History of chicken pox   . Hx: UTI (urinary tract infection)   . Hyperlipidemia   . Hypertension   . Migraines   . Rosacea   . Sleep apnea    uses cpap every night      SURGICAL HISTORY  Nasal surgery Nasal  nasal FAMILY HISTORY   Family History  Problem Relation Age of Onset  . Heart disease Father   . Hypertension Father   . Diabetes Other   . Heart disease Mother   . Stroke Paternal  Grandmother   . Heart disease Paternal Grandfather   . Hypertension Paternal Grandfather   . Breast cancer Paternal Aunt      SOCIAL HISTORY   Social History  Substance Use Topics  . Smoking status: Never Smoker  . Smokeless tobacco: Never Used  . Alcohol use 0.0 oz/week     Comment: wine occasional     MEDICATIONS    Home Medication:  Current Outpatient Rx  . Order #: [id number] Class: Phone In  . Order #: [id number] Class: Historical Med  . Order #: [id number] Class: Historical Med  . Order #: [id number] Class: Historical Med  . Order #: [id number] Class: Normal  . Order #: [id number] Class: Normal  . Order #: [id number] Class: Normal  . Order #: [id number] Class: Normal  . Order #: [id number] Class: Print  . Order #: [id number] Class: Normal    Current Medication:  Current Outpatient Prescriptions:  .  ALPRAZolam (XANAX) 0.5 MG tablet, TAKE 1 TABLET BY MOUTH TWICE DAILY AS NEEDED FOR ANXIETY, Disp: 180 tablet, Rfl: 0 .  aspirin 81 MG tablet, Take 81 mg by mouth daily.  , Disp: , Rfl:  .  Cholecalciferol (VITAMIN D3) 1000 UNITS CAPS, Take 5,000  capsules by mouth daily. , Disp: , Rfl:  .  docusate sodium (COLACE) 100 MG capsule, Take 100 mg by mouth daily. , Disp: , Rfl:  .  EPINEPHrine (EPI-PEN) 0.3 mg/0.3 mL SOAJ injection, Inject 0.3 mLs (0.3 mg total) into the muscle once. For allergic reaction, Disp: 1 Device, Rfl: 3 .  levofloxacin (LEVAQUIN) 500 MG tablet, Take 1 tablet (500 mg total) by mouth daily., Disp: 7 tablet, Rfl: 0 .  lisinopril-hydrochlorothiazide (PRINZIDE,ZESTORETIC) 20-25 MG tablet, Take 1 tablet by mouth daily., Disp: 90 tablet, Rfl: 3 .  omeprazole (PRILOSEC) 20 MG capsule, Take 1 capsule (20 mg total) by mouth 2 (two) times daily., Disp: 180 capsule, Rfl: 3 .  predniSONE (STERAPRED UNI-PAK 21 TAB) 10 MG (21) TBPK tablet, Take 1 tablet (10 mg total) by mouth daily. Take 6 tabs by mouth daily  for 2 days, then 5 tabs for 2 days, then 4 tabs for 2 days, then 3 tabs for 2  days, 2 tabs for 2 days, then 1 tab by mouth daily for 2 days, Disp: 42 tablet, Rfl: 0 .  simvastatin (ZOCOR) 20 MG tablet, Take 1 tablet (20 mg total) by mouth at bedtime., Disp: 90 tablet, Rfl: 3    ALLERGIES   Shrimp [shellfish allergy]     REVIEW OF SYSTEMS   Review of Systems  Constitutional: Negative for chills, diaphoresis, fever, malaise/fatigue and weight loss.  HENT: Negative for congestion and hearing loss.   Eyes: Negative for blurred vision and double vision.  Respiratory: Negative for cough, hemoptysis, sputum production, shortness of breath and wheezing.   Cardiovascular: Negative for chest pain, palpitations and orthopnea.  Gastrointestinal: Negative for abdominal pain, heartburn, nausea and vomiting.  Genitourinary: Negative for dysuria and urgency.  Musculoskeletal: Negative for back pain, myalgias and neck pain.  Skin: Negative for rash.  Neurological: Negative for dizziness, tingling, tremors, weakness and headaches.  Endo/Heme/Allergies: Does not bruise/bleed easily.  Psychiatric/Behavioral: Negative for depression, substance abuse and suicidal ideas.  All other systems reviewed and are negative.  BP 122/78 (BP Location: Left Arm, Cuff Size: Normal)   Pulse 78   Wt 159 lb (72.1 kg)   LMP 08/26/1997   SpO2 97%   BMI 28.17 kg/m    PHYSICAL EXAM  Physical Exam  Constitutional: She is oriented to person, place, and time. She appears well-developed and well-nourished. No distress.  HENT:  Head: Normocephalic and atraumatic.  Mouth/Throat: No oropharyngeal exudate.  Eyes: EOM are normal. Pupils are equal, round, and reactive to light. No scleral icterus.  Neck: Normal range of motion. Neck supple.  Cardiovascular: Normal rate, regular rhythm and normal heart sounds.   No murmur heard. Pulmonary/Chest: No stridor. No respiratory distress. She has no wheezes.  Abdominal: Soft. Bowel sounds are normal.  Musculoskeletal: Normal range of motion. She  exhibits no edema.  Neurological: She is alert and oriented to person, place, and time. She displays normal reflexes. Coordination normal.  Skin: Skin is warm. She is not diaphoretic.  Psychiatric: She has a normal mood and affect.           IMAGING    Dg Chest 2 View  Result Date: 08/09/2016 CLINICAL DATA:  Recheck right lung infiltrate EXAM: CHEST  2 VIEW COMPARISON:  08/04/2016 FINDINGS: Resolution of the previously seen right basilar opacity. No confluent opacities or suspicious nodules. Heart is normal size. No effusions or acute bony abnormality. IMPRESSION: No active cardiopulmonary disease. Electronically Signed   By: Charlett Nose M.D.  On: 08/09/2016 12:43     Images reviewed: no acute process, no pneumonia   ASSESSMENT/PLAN   71 yo white female seen today for excessive daytime sleepiness and fatigue from OSA  Patient will need Sleep Study ASAP  Follow up after test completed   I have personally obtained a history, examined the patient, evaluated laboratory and independently reviewed imaging results, formulated the assessment and plan and placed orders.  The Patient requires high complexity decision making for assessment and support, frequent evaluation and titration of therapies, application of advanced monitoring technologies and extensive interpretation of multiple databases.    Patient satisfied with Plan of action and management. All questions answered  Lucie LeatherKurian David Camilo Mander, M.D.  [facility]  Medical Director [facility] Medical Director [facility]

## 2016-09-10 ENCOUNTER — Other Ambulatory Visit: Payer: Self-pay | Admitting: Family Medicine

## 2016-09-10 NOTE — Telephone Encounter (Signed)
Pt has an AWV scheduled for 11/13/16, last filled on 06/18/16 #180 tabs with 0 refills, please advise

## 2016-09-10 NOTE — Telephone Encounter (Signed)
A little early for refill?   Please check in with her

## 2016-09-16 ENCOUNTER — Other Ambulatory Visit: Payer: Self-pay

## 2016-09-16 MED ORDER — ALPRAZOLAM 0.5 MG PO TABS: 0.5000 mg | ORAL_TABLET | Freq: Two times a day (BID) | ORAL | 0 refills | Status: DC | PRN

## 2016-09-16 NOTE — Telephone Encounter (Signed)
Pt request refill alprazolam to [facility]. Last refilled # 180 on 06/18/16. Last annual 11/13/15 and pt scheduled for med wellness on 11/13/16.

## 2016-09-16 NOTE — Telephone Encounter (Signed)
Rx called in as prescribed 

## 2016-09-16 NOTE — Telephone Encounter (Signed)
Px written for call in   

## 2016-09-21 DIAGNOSIS — R6889 Other general symptoms and signs: Secondary | ICD-10-CM | POA: Diagnosis not present

## 2016-09-26 ENCOUNTER — Encounter: Payer: Self-pay | Admitting: Family Medicine

## 2016-09-27 ENCOUNTER — Ambulatory Visit: Payer: Medicare HMO | Attending: Internal Medicine

## 2016-09-27 ENCOUNTER — Encounter: Payer: Self-pay | Admitting: Internal Medicine

## 2016-09-27 DIAGNOSIS — G4710 Hypersomnia, unspecified: Secondary | ICD-10-CM | POA: Diagnosis not present

## 2016-09-27 DIAGNOSIS — R0683 Snoring: Secondary | ICD-10-CM | POA: Diagnosis not present

## 2016-09-27 DIAGNOSIS — R918 Other nonspecific abnormal finding of lung field: Secondary | ICD-10-CM | POA: Diagnosis not present

## 2016-09-27 DIAGNOSIS — G4719 Other hypersomnia: Secondary | ICD-10-CM

## 2016-09-30 ENCOUNTER — Encounter: Payer: Self-pay | Admitting: Family Medicine

## 2016-09-30 ENCOUNTER — Other Ambulatory Visit: Payer: Self-pay | Admitting: *Deleted

## 2016-09-30 MED ORDER — SIMVASTATIN 10 MG PO TABS: 20.0000 mg | ORAL_TABLET | Freq: Every day | ORAL | 1 refills | Status: DC

## 2016-10-01 ENCOUNTER — Encounter: Payer: Self-pay | Admitting: Family Medicine

## 2016-10-01 MED ORDER — SERTRALINE HCL 50 MG PO TABS: 50.0000 mg | ORAL_TABLET | Freq: Every day | ORAL | 11 refills | Status: DC

## 2016-10-02 DIAGNOSIS — J181 Lobar pneumonia, unspecified organism: Secondary | ICD-10-CM | POA: Diagnosis not present

## 2016-10-02 DIAGNOSIS — R05 Cough: Secondary | ICD-10-CM | POA: Diagnosis not present

## 2016-10-02 DIAGNOSIS — R319 Hematuria, unspecified: Secondary | ICD-10-CM | POA: Diagnosis not present

## 2016-10-02 DIAGNOSIS — J40 Bronchitis, not specified as acute or chronic: Secondary | ICD-10-CM | POA: Diagnosis not present

## 2016-10-02 DIAGNOSIS — N390 Urinary tract infection, site not specified: Secondary | ICD-10-CM | POA: Diagnosis not present

## 2016-10-02 DIAGNOSIS — R509 Fever, unspecified: Secondary | ICD-10-CM | POA: Diagnosis not present

## 2016-10-06 ENCOUNTER — Encounter: Payer: Self-pay | Admitting: Family Medicine

## 2016-10-07 MED ORDER — LEVOFLOXACIN 500 MG PO TABS: 500.0000 mg | ORAL_TABLET | Freq: Every day | ORAL | 0 refills | Status: DC

## 2016-10-08 DIAGNOSIS — G4733 Obstructive sleep apnea (adult) (pediatric): Secondary | ICD-10-CM | POA: Diagnosis not present

## 2016-10-09 ENCOUNTER — Telehealth: Payer: Self-pay | Admitting: *Deleted

## 2016-10-09 NOTE — Telephone Encounter (Signed)
Informed pt of sleep study results and she states she doesn't want to do another sleep test at this time.    FYI for you.

## 2016-10-09 NOTE — Telephone Encounter (Signed)
-----   Message from Shane CrutchPradeep Ramachandran, MD sent at 10/08/2016  5:38 PM EST ----- Regarding: sleep study results.  AHI on sleep study was 4.4, just below cutoff of OSA. Recommend sending patient for a home sleep study.

## 2016-10-11 ENCOUNTER — Telehealth: Payer: Self-pay | Admitting: Family Medicine

## 2016-10-11 ENCOUNTER — Ambulatory Visit (INDEPENDENT_AMBULATORY_CARE_PROVIDER_SITE_OTHER)
Admission: RE | Admit: 2016-10-11 | Discharge: 2016-10-11 | Disposition: A | Discharge status: Home / Self Care | Payer: Medicare HMO | Source: Ambulatory Visit | Attending: Family Medicine | Admitting: Family Medicine

## 2016-10-11 ENCOUNTER — Encounter: Payer: Self-pay | Admitting: Family Medicine

## 2016-10-11 ENCOUNTER — Ambulatory Visit (INDEPENDENT_AMBULATORY_CARE_PROVIDER_SITE_OTHER): Payer: Medicare HMO | Admitting: Family Medicine

## 2016-10-11 VITALS — BP 128/78 | HR 93 | Temp 98.3°F | Ht 63.0 in | Wt 152.5 lb

## 2016-10-11 DIAGNOSIS — J189 Pneumonia, unspecified organism: Secondary | ICD-10-CM

## 2016-10-11 DIAGNOSIS — R918 Other nonspecific abnormal finding of lung field: Secondary | ICD-10-CM | POA: Diagnosis not present

## 2016-10-11 DIAGNOSIS — J181 Lobar pneumonia, unspecified organism: Secondary | ICD-10-CM

## 2016-10-11 DIAGNOSIS — R319 Hematuria, unspecified: Secondary | ICD-10-CM

## 2016-10-11 NOTE — Progress Notes (Signed)
Pre visit review using our clinic review tool, if applicable. No additional management support is needed unless otherwise documented below in the visit note. 

## 2016-10-11 NOTE — Assessment & Plan Note (Signed)
Pt had another symptomatic bout of CAP in early February Now in RML, also with effusion  And R base atelectasis  Clinical imp today on levaquin  Cannot bring up her cxr from [facility]/ but told it saw RML infiltrate  Will finish abx  F/u 3-4 wk for re check xr  Consider CT at that time if unclear

## 2016-10-11 NOTE — Progress Notes (Signed)
Subjective:    Patient ID: Adriana Marks, female    DOB: May 26, 1946, 71 y.o.   MRN: [id number]  HPI Here for f/u 10/02/16 urgent care visit [facility]) for uti and pneumonia   She presented with pink urne and fever and back pain (L side low), nasal congestion /chest congestion and exp to the flu , and some pleuritic pain   Her ua was pos Her flu test was neg for A and B  cxr showed infiltrate on the right   Given keflex = was not all the way better Then levaquin - started yesterday - because she was not feeling better     Urine cx returned with clinically insignif growth / contamination  Her dysuria and blood have cleared up   She has improved Still quite tired  Repeated chest xray today  Cough is improved - no cough medicine lately  Still some phlegm   CXR today  FINDINGS: There is airspace consolidation with volume loss in the right middle lobe. There is a pleural effusion with atelectatic change in the right base. Lungs elsewhere clear. Heart size and pulmonary vascularity are normal. No adenopathy. There is degenerative change in the thoracic spine.  IMPRESSION: Airspace consolidation with volume loss consistent with pneumonia right middle lobe. Right pleural effusion present with right base atelectasis. Lungs elsewhere clear.  Followup PA and lateral chest radiographs recommended in 3-4 weeks following trial of antibiotic therapy to ensure resolution and exclude underlying malignancy.  These results will be called to the ordering clinician or representative by the Radiologist Assistant, and communication documented in the PACS or zVision Dashboard.    Mood- lost her xanax for sleep (thinks she accidentally threw it out)  Was going to try zoloft instead -then remembered it have her headaches in the past  Some stressors  Patient Active Problem List   Diagnosis Date Noted  . CAP (community acquired pneumonia) 10/11/2016  . Hematuria 10/11/2016  .  Infiltrate of lung present on imaging of chest 08/09/2016  . Grief reaction 09/09/2014  . Encounter for Medicare annual wellness exam 09/06/2013  . 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 Hyperglycemia 08/12/2012  . Other screening mammogram 03/22/2011  . Hyperlipidemia 01/01/2011  . Hypertension 01/01/2011  . GERD (gastroesophageal reflux disease) 01/01/2011  . Routine general medical examination at a health care facility 01/01/2011  . Vitamin D deficiency disease 01/01/2011  . Osteopenia 01/01/2011  . Sleep apnea 01/01/2011  . Anxiety 01/01/2011  . Allergic rhinitis 01/01/2011  . Rosacea 01/01/2011   Past Medical History:  Diagnosis Date  . Allergy   . Bronchitis   . Colon polyps   . Depression   . GERD (gastroesophageal reflux disease)   . Heart murmur   . History of chicken pox   . Hx: UTI (urinary tract infection)   . Hyperlipidemia   . Hypertension   . Migraines   . Rosacea   . Sleep apnea    uses cpap every night    No past surgical history on file. Social History  Substance Use Topics  . Smoking status: Never Smoker  . Smokeless tobacco: Never Used  . Alcohol use 0.0 oz/week     Comment: wine occasional   Family History  Problem Relation Age of Onset  . Heart disease Father   . Hypertension Father   . Diabetes Other   . Heart disease Mother   . Stroke Paternal Grandmother   . Heart disease Paternal Grandfather   . Hypertension Paternal  Grandfather   . Breast cancer Paternal Aunt    Allergies  Allergen Reactions  . Shrimp [Shellfish Allergy]     Sob/ swelling    Current Outpatient Prescriptions on File Prior to Visit  Medication Sig Dispense Refill  . aspirin 81 MG tablet Take 81 mg by mouth daily.      . Cholecalciferol (VITAMIN D3) 1000 UNITS CAPS Take 5,000 capsules by mouth daily.     Marland Kitchen docusate sodium (COLACE) 100 MG capsule Take 100 mg by mouth daily.     Marland Kitchen EPINEPHrine (EPI-PEN) 0.3 mg/0.3 mL SOAJ injection Inject 0.3 mLs (0.3 mg total) into the  muscle once. For allergic reaction 1 Device 3  . levofloxacin (LEVAQUIN) 500 MG tablet Take 1 tablet (500 mg total) by mouth daily. 7 tablet 0  . lisinopril-hydrochlorothiazide (PRINZIDE,ZESTORETIC) 20-25 MG tablet Take 1 tablet by mouth daily. 90 tablet 3  . loratadine (CLARITIN) 10 MG tablet Take 10 mg by mouth daily.    Marland Kitchen omeprazole (PRILOSEC) 20 MG capsule Take 1 capsule (20 mg total) by mouth 2 (two) times daily. 180 capsule 3  . predniSONE (STERAPRED UNI-PAK 21 TAB) 10 MG (21) TBPK tablet Take 1 tablet (10 mg total) by mouth daily. Take 6 tabs by mouth daily  for 2 days, then 5 tabs for 2 days, then 4 tabs for 2 days, then 3 tabs for 2 days, 2 tabs for 2 days, then 1 tab by mouth daily for 2 days 42 tablet 0  . simvastatin (ZOCOR) 10 MG tablet Take 2 tablets (20 mg total) by mouth at bedtime. 180 tablet 1  . ALPRAZolam (XANAX) 0.5 MG tablet Take 1 tablet (0.5 mg total) by mouth 2 (two) times daily as needed. for anxiety (Patient not taking: Reported on 10/11/2016) 180 tablet 0  . [DISCONTINUED] atorvastatin (LIPITOR) 10 MG tablet Take 1 tablet (10 mg total) by mouth daily. 30 tablet 11   No current facility-administered medications on file prior to visit.      Review of Systems Review of Systems  Constitutional: Negative for fever, appetite change, fatigue and unexpected weight change.  Eyes: Negative for pain and visual disturbance.  Respiratory: pos for improving  for cough and shortness of breath.  neg for wheezing Cardiovascular: Negative for cp or palpitations    Gastrointestinal: Negative for nausea, diarrhea and constipation.  Genitourinary: Negative for urgency and frequency.  Skin: Negative for pallor or rash   Neurological: Negative for weakness, light-headedness, numbness and headaches.  Hematological: Negative for adenopathy. Does not bruise/bleed easily.  Psychiatric/Behavioral: Negative for dysphoric mood. The patient is occ nervous/anxious          Objective:    Physical Exam  Constitutional: She appears well-developed and well-nourished. No distress.  Well appearing   HENT:  Head: Normocephalic and atraumatic.  Nose: Nose normal.  Mouth/Throat: Oropharynx is clear and moist.  Eyes: Conjunctivae and EOM are normal. Pupils are equal, round, and reactive to light.  Neck: Normal range of motion. Neck supple. No JVD present. Carotid bruit is not present. No thyromegaly present.  Cardiovascular: Normal rate, regular rhythm, normal heart sounds and intact distal pulses.  Exam reveals no gallop.   Pulmonary/Chest: Effort normal and breath sounds normal. No respiratory distress. She has no wheezes. She has no rales.  No crackles Harsh bs with scant rales in R lower lung and less in L No wheeze  Good air exch    Abdominal: Soft. Bowel sounds are normal. She exhibits no distension, no  abdominal bruit and no mass. There is no tenderness.  No suprapubic tenderness or fullness  No cva tenderness   Musculoskeletal: She exhibits no edema.  Lymphadenopathy:    She has no cervical adenopathy.  Neurological: She is alert. She has normal reflexes. No cranial nerve deficit. She exhibits normal muscle tone. Coordination normal.  Skin: Skin is warm and dry. No rash noted.  Psychiatric: She has a normal mood and affect.          Assessment & Plan:   Problem List Items Addressed This Visit      Respiratory   CAP (community acquired pneumonia)    Clinically improved  Rev xray and also her notes from [facility] Will return to re check cxr and exam in 3-4 weeks        Infiltrate of lung present on imaging of chest - Primary    Pt had another symptomatic bout of CAP in early February Now in RML, also with effusion  And R base atelectasis  Clinical imp today on levaquin  Cannot bring up her cxr from [facility]/ but told it saw RML infiltrate  Will finish abx  F/u 3-4 wk for re check xr  Consider CT at that time if unclear           Other   Hematuria     Now clinically resolved from [facility] with keflex  Her cx was not positive-but showed contamination  Re check ua at f/u in 3-4 wk when done with abx

## 2016-10-11 NOTE — Telephone Encounter (Signed)
Order for xray ?

## 2016-10-11 NOTE — Assessment & Plan Note (Signed)
Clinically improved  Rev xray and also her notes from [facility] Will return to re check cxr and exam in 3-4 weeks

## 2016-10-11 NOTE — Patient Instructions (Signed)
Finish your levaquin  Drink lots of fluids and get rest as much as you can  Follow up in 3-4 weeks - we will re check xray and urinalysis that day  If your symptoms get worse please let me know

## 2016-10-11 NOTE — Assessment & Plan Note (Signed)
Now clinically resolved from [facility] with keflex  Her cx was not positive-but showed contamination  Re check ua at f/u in 3-4 wk when done with abx

## 2016-10-22 ENCOUNTER — Other Ambulatory Visit: Payer: Self-pay | Admitting: Family Medicine

## 2016-10-22 DIAGNOSIS — Z1231 Encounter for screening mammogram for malignant neoplasm of breast: Secondary | ICD-10-CM

## 2016-10-25 ENCOUNTER — Telehealth: Payer: Self-pay | Admitting: Family Medicine

## 2016-10-25 DIAGNOSIS — J189 Pneumonia, unspecified organism: Secondary | ICD-10-CM

## 2016-10-25 DIAGNOSIS — J181 Lobar pneumonia, unspecified organism: Principal | ICD-10-CM

## 2016-10-25 NOTE — Telephone Encounter (Signed)
Patient had f/u appointment for pneumonia with Dr.[name] on 11/08/16.  Dr.[name] will be out of the office that day.  Patient has her awv w/ [facility] Axe[facility] on 11/13/16.  Patient wants to know if an order for an x-ray can be put in for her to have the x-ray done on 11/13/16 and be called with the results or the results sent to my chart. Patient has an appointment for a physical with Dr.[name] on 12/04/16.

## 2016-10-25 NOTE — Telephone Encounter (Signed)
That is fine with me but unsure if insurance will pay for it the same day as AMW  Could that be figured out?   Will cc to [employer] Thanks

## 2016-10-28 NOTE — Telephone Encounter (Signed)
I put in the future order for approx 1 wk  Thanks

## 2016-10-28 NOTE — Telephone Encounter (Signed)
Pt notified xray order done and she can come in to have it done

## 2016-10-28 NOTE — Telephone Encounter (Signed)
Patient called back and said she could come a different day just to have the x-ray done.  She would like to come next week. Patient said she can be advised of results on my chart.  Please advise.

## 2016-10-29 MED ORDER — LISINOPRIL-HYDROCHLOROTHIAZIDE 20-25 MG PO TABS: 1.0000 | ORAL_TABLET | Freq: Every day | ORAL | 0 refills | Status: DC

## 2016-10-29 NOTE — Addendum Note (Signed)
Addended by: Shon MilletWATLINGTON, Ariz Terrones M on: 10/29/2016 12:43 PM   Modules accepted: Orders

## 2016-11-08 ENCOUNTER — Ambulatory Visit: Payer: Medicare HMO | Admitting: Family Medicine

## 2016-11-11 ENCOUNTER — Telehealth: Payer: Self-pay | Admitting: Family Medicine

## 2016-11-11 DIAGNOSIS — E559 Vitamin D deficiency, unspecified: Secondary | ICD-10-CM

## 2016-11-11 DIAGNOSIS — Z0000 Encounter for general adult medical examination without abnormal findings: Secondary | ICD-10-CM

## 2016-11-11 DIAGNOSIS — R739 Hyperglycemia, unspecified: Secondary | ICD-10-CM

## 2016-11-11 DIAGNOSIS — R319 Hematuria, unspecified: Secondary | ICD-10-CM

## 2016-11-11 NOTE — Telephone Encounter (Signed)
Lab order

## 2016-11-13 ENCOUNTER — Ambulatory Visit (INDEPENDENT_AMBULATORY_CARE_PROVIDER_SITE_OTHER): Payer: Medicare HMO

## 2016-11-13 ENCOUNTER — Other Ambulatory Visit: Payer: Medicare HMO

## 2016-11-13 VITALS — BP 124/80 | HR 73 | Temp 98.2°F | Ht 62.5 in | Wt 154.0 lb

## 2016-11-13 DIAGNOSIS — E559 Vitamin D deficiency, unspecified: Secondary | ICD-10-CM

## 2016-11-13 DIAGNOSIS — R739 Hyperglycemia, unspecified: Secondary | ICD-10-CM

## 2016-11-13 DIAGNOSIS — Z0000 Encounter for general adult medical examination without abnormal findings: Secondary | ICD-10-CM

## 2016-11-13 LAB — CBC WITH DIFFERENTIAL/PLATELET
Basophils Absolute: 0.1 10*3/uL (ref 0.0–0.1)
Basophils Relative: 1.1 % (ref 0.0–3.0)
Eosinophils Absolute: 0.2 10*3/uL (ref 0.0–0.7)
Eosinophils Relative: 2.7 % (ref 0.0–5.0)
HCT: 40.2 % (ref 36.0–46.0)
Hemoglobin: 13.8 g/dL (ref 12.0–15.0)
Lymphocytes Relative: 28.3 % (ref 12.0–46.0)
Lymphs Abs: 2 10*3/uL (ref 0.7–4.0)
MCHC: 34.3 g/dL (ref 30.0–36.0)
MCV: 91 fl (ref 78.0–100.0)
Monocytes Absolute: 0.7 10*3/uL (ref 0.1–1.0)
Monocytes Relative: 9.4 % (ref 3.0–12.0)
Neutro Abs: 4.1 10*3/uL (ref 1.4–7.7)
Neutrophils Relative %: 58.5 % (ref 43.0–77.0)
Platelets: 304 10*3/uL (ref 150.0–400.0)
RBC: 4.41 Mil/uL (ref 3.87–5.11)
RDW: 13.7 % (ref 11.5–15.5)
WBC: 7.1 10*3/uL (ref 4.0–10.5)

## 2016-11-13 LAB — VITAMIN D 25 HYDROXY (VIT D DEFICIENCY, FRACTURES): VITD: 62.76 ng/mL (ref 30.00–100.00)

## 2016-11-13 LAB — COMPREHENSIVE METABOLIC PANEL
ALT: 14 U/L (ref 0–35)
AST: 20 U/L (ref 0–37)
Albumin: 4.2 g/dL (ref 3.5–5.2)
Alkaline Phosphatase: 54 U/L (ref 39–117)
BUN: 14 mg/dL (ref 6–23)
CO2: 30 mEq/L (ref 19–32)
Calcium: 10.3 mg/dL (ref 8.4–10.5)
Chloride: 100 mEq/L (ref 96–112)
Creatinine, Ser: 0.79 mg/dL (ref 0.40–1.20)
GFR: 76.71 mL/min (ref >60.00)
Glucose, Bld: 108 mg/dL — ABNORMAL HIGH (ref 70–99)
Potassium: 4.3 mEq/L (ref 3.5–5.1)
Sodium: 136 mEq/L (ref 135–145)
Total Bilirubin: 0.5 mg/dL (ref 0.2–1.2)
Total Protein: 7.2 g/dL (ref 6.0–8.3)

## 2016-11-13 LAB — LIPID PANEL
Cholesterol: 183 mg/dL (ref 0–200)
HDL: 57.6 mg/dL (ref >39.00)
LDL Cholesterol: 103 mg/dL — ABNORMAL HIGH (ref 0–99)
NonHDL: 125.44
Total CHOL/HDL Ratio: 3
Triglycerides: 114 mg/dL (ref 0.0–149.0)
VLDL: 22.8 mg/dL (ref 0.0–40.0)

## 2016-11-13 LAB — TSH: TSH: 2.87 u[IU]/mL (ref 0.35–4.50)

## 2016-11-13 LAB — HEMOGLOBIN A1C: Hgb A1c MFr Bld: 5.4 % (ref 4.6–6.5)

## 2016-11-13 NOTE — Progress Notes (Signed)
PCP notes:   Health maintenance:  Mammogram - addressed; future appt scheduled  Abnormal screenings:   Depression score: 4  Patient concerns:   None  Nurse concerns:  None  Next PCP appt:   12/04/16 @ 1115  I reviewed health advisor's note, was available for consultation, and agree with documentation and plan. Roxy MannsMarne [name] MD

## 2016-11-13 NOTE — Patient Instructions (Signed)
Adriana Marks , Thank you for taking time to come for your Medicare Wellness Visit. I appreciate your ongoing commitment to your health goals. Please review the following plan we discussed and let me know if I can assist you in the future.   These are the goals we discussed: Goals    . emotional health (pt-stated)          Starting 11/06/2015, I will spend 1 hour daily doing an activity just for my own personal mental health.       . Increase physical activity          Starting 11/13/16, I will continue to walk at least 30 min daily.        This is a list of the screening recommended for you and due dates:  Health Maintenance  Topic Date Due  . Mammogram  11/21/2016  . Pneumonia vaccines (2 of 2 - PPSV23) 08/27/2017  . Colon Cancer Screening  07/27/2019  . Tetanus Vaccine  12/31/2020  . Flu Shot  Addressed  . DEXA scan (bone density measurement)  Completed  .  Hepatitis C: One time screening is recommended by Center for Disease Control  (CDC) for  adults born from [birth year] through 1965.   Completed   Preventive Care for Adults  A healthy lifestyle and preventive care can promote health and wellness. Preventive health guidelines for adults include the following key practices.  . A routine yearly physical is a good way to check with your health care provider about your health and preventive screening. It is a chance to share any concerns and updates on your health and to receive a thorough exam.  . Visit your dentist for a routine exam and preventive care every 6 months. Brush your teeth twice a day and floss once a day. Good oral hygiene prevents tooth decay and gum disease.  . The frequency of eye exams is based on your age, health, family medical history, use  of contact lenses, and other factors. Follow your health care provider's ecommendations for frequency of eye exams.  . Eat a healthy diet. Foods like vegetables, fruits, whole grains, low-fat dairy products, and lean protein  foods contain the nutrients you need without too many calories. Decrease your intake of foods high in solid fats, added sugars, and salt. Eat the right amount of calories for you. Get information about a proper diet from your health care provider, if necessary.  . Regular physical exercise is one of the most important things you can do for your health. Most adults should get at least 150 minutes of moderate-intensity exercise (any activity that increases your heart rate and causes you to sweat) each week. In addition, most adults need muscle-strengthening exercises on 2 or more days a week.  Silver Sneakers may be a benefit available to you. To determine eligibility, you may visit the website: www.silversneakers.com or contact program at [phone] Mon-Fri between 8AM-8PM.   . Maintain a healthy weight. The body mass index (BMI) is a screening tool to identify possible weight problems. It provides an estimate of body fat based on height and weight. Your health care provider can find your BMI and can help you achieve or maintain a healthy weight.   For adults 20 years and older: ? A BMI below 18.5 is considered underweight. ? A BMI of 18.5 to 24.9 is normal. ? A BMI of 25 to 29.9 is considered overweight. ? A BMI of 30 and above is considered obese.   .Marland Kitchen  Maintain normal blood lipids and cholesterol levels by exercising and minimizing your intake of saturated fat. Eat a balanced diet with plenty of fruit and vegetables. Blood tests for lipids and cholesterol should begin at age 72 and be repeated every 5 years. If your lipid or cholesterol levels are high, you are over 50, or you are at high risk for heart disease, you may need your cholesterol levels checked more frequently. Ongoing high lipid and cholesterol levels should be treated with medicines if diet and exercise are not working.  . If you smoke, find out from your health care provider how to quit. If you do not use tobacco, please do not  start.  . If you choose to drink alcohol, please do not consume more than 2 drinks per day. One drink is considered to be 12 ounces (355 mL) of beer, 5 ounces (148 mL) of wine, or 1.5 ounces (44 mL) of liquor.  . If you are 22-35 years old, ask your health care provider if you should take aspirin to prevent strokes.  . Use sunscreen. Apply sunscreen liberally and repeatedly throughout the day. You should seek shade when your shadow is shorter than you. Protect yourself by wearing long sleeves, pants, a wide-brimmed hat, and sunglasses year round, whenever you are outdoors.  . Once a month, do a whole body skin exam, using a mirror to look at the skin on your back. Tell your health care provider of new moles, moles that have irregular borders, moles that are larger than a pencil eraser, or moles that have changed in shape or color.

## 2016-11-13 NOTE — Progress Notes (Signed)
Subjective:   Adriana Marks is a 71 y.o. female who presents for Medicare Annual (Subsequent) preventive examination.  Review of Systems:  N/A Cardiac Risk Factors include: advanced age (>14men, >84 women);dyslipidemia;hypertension     Objective:     Vitals: BP 124/80 (BP Location: Right Arm, Patient Position: Sitting, Cuff Size: Normal)   Pulse 73   Temp 98.2 F (36.8 C) (Oral)   Ht 5' 2.5" (1.588 m) Comment: no shoes  Wt 154 lb (69.9 kg)   LMP 08/26/1997   SpO2 97%   BMI 27.72 kg/m   Body mass index is 27.72 kg/m.   Tobacco History  Smoking Status  . Never Smoker  Smokeless Tobacco  . Never Used     Counseling given: No   Past Medical History:  Diagnosis Date  . Allergy   . Bronchitis   . Colon polyps   . Depression   . GERD (gastroesophageal reflux disease)   . Heart murmur   . History of chicken pox   . Hx: UTI (urinary tract infection)   . Hyperlipidemia   . Hypertension   . Migraines   . Rosacea   . Sleep apnea    uses cpap every night    History reviewed. No pertinent surgical history. Family History  Problem Relation Age of Onset  . Heart disease Father   . Hypertension Father   . Diabetes Other   . Heart disease Mother   . Stroke Paternal Grandmother   . Heart disease Paternal Grandfather   . Hypertension Paternal Grandfather   . Breast cancer Paternal Aunt    History  Sexual Activity  . Sexual activity: No    Outpatient Encounter Prescriptions as of 11/13/2016  Medication Sig  . aspirin 81 MG tablet Take 81 mg by mouth daily.    . Cholecalciferol (VITAMIN D3) 1000 UNITS CAPS Take 5,000 capsules by mouth daily.   Marland Kitchen EPINEPHrine (EPI-PEN) 0.3 mg/0.3 mL SOAJ injection Inject 0.3 mLs (0.3 mg total) into the muscle once. For allergic reaction  . lisinopril-hydrochlorothiazide (PRINZIDE,ZESTORETIC) 20-25 MG tablet Take 1 tablet by mouth daily.  Marland Kitchen loratadine (CLARITIN) 10 MG tablet Take 10 mg by mouth daily.  . Naproxen Sodium (ALEVE  PO) Take 1 tablet by mouth as needed.  Marland Kitchen omeprazole (PRILOSEC) 20 MG capsule Take 1 capsule (20 mg total) by mouth 2 (two) times daily.  . simvastatin (ZOCOR) 10 MG tablet Take 2 tablets (20 mg total) by mouth at bedtime.  . ALPRAZolam (XANAX) 0.5 MG tablet Take 1 tablet (0.5 mg total) by mouth 2 (two) times daily as needed. for anxiety (Patient not taking: Reported on 10/11/2016)  . [DISCONTINUED] docusate sodium (COLACE) 100 MG capsule Take 100 mg by mouth daily.   . [DISCONTINUED] levofloxacin (LEVAQUIN) 500 MG tablet Take 1 tablet (500 mg total) by mouth daily.  . [DISCONTINUED] predniSONE (STERAPRED UNI-PAK 21 TAB) 10 MG (21) TBPK tablet Take 1 tablet (10 mg total) by mouth daily. Take 6 tabs by mouth daily  for 2 days, then 5 tabs for 2 days, then 4 tabs for 2 days, then 3 tabs for 2 days, 2 tabs for 2 days, then 1 tab by mouth daily for 2 days   No facility-administered encounter medications on file as of 11/13/2016.     Activities of Daily Living In your present state of health, do you have any difficulty performing the following activities: 11/13/2016  Hearing? Y  Vision? N  Difficulty concentrating or making decisions? N  Walking or climbing stairs? N  Dressing or bathing? N  Doing errands, shopping? N  Preparing Food and eating ? N  Using the Toilet? N  In the past six months, have you accidently leaked urine? N  Do you have problems with loss of bowel control? N  Managing your Medications? N  Managing your Finances? N  Housekeeping or managing your Housekeeping? N  Some recent data might be hidden    Patient Care Team: Judy PimpleMarne A [name], MD as PCP - General (Family Medicine)    Assessment:      Hearing Screening   125Hz  250Hz  500Hz  1000Hz  2000Hz  3000Hz  4000Hz  6000Hz  8000Hz   Right ear:   40 40 40  40    Left ear:   40 40 40  40    Vision Screening Comments: Last vision exam in 2016   Exercise Activities and Dietary recommendations Current Exercise Habits: Home exercise  routine, Type of exercise: walking, Time (Minutes): 30, Frequency (Times/Week): 7, Weekly Exercise (Minutes/Week): 210, Intensity: Moderate, Exercise limited by: None identified  Goals    . emotional health (pt-stated)          Starting 11/06/2015, I will spend 1 hour daily doing an activity just for my own personal mental health.       . Increase physical activity          Starting 11/13/16, I will continue to walk at least 30 min daily.       Fall Risk Fall Risk  11/13/2016 11/06/2015 09/09/2014 09/06/2013  Falls in the past year? No Yes No Yes  Number falls in past yr: - 1 - 1  Injury with Fall? - No - No  Follow up - Falls evaluation completed - -   Depression Screen PHQ 2/9 Scores 11/13/2016 11/06/2015 09/09/2014 09/06/2013  PHQ - 2 Score 2 4 1  0  PHQ- 9 Score 4 4 - -     Cognitive Function MMSE - Mini Mental State Exam 11/13/2016 11/06/2015  Orientation to time 5 5  Orientation to Place 5 5  Registration 3 3  Attention/ Calculation 0 5  Recall 3 3  Language- name 2 objects 0 0  Language- repeat 1 1  Language- follow 3 step command 3 3  Language- read & follow direction 0 1  Write a sentence 0 0  Copy design 0 0  Total score 20 26     PLEASE NOTE: A Mini-Cog screen was completed. Maximum score is 20. A value of 0 denotes this part of Folstein MMSE was not completed or the patient failed this part of the Mini-Cog screening.   Mini-Cog Screening Orientation to Time - Max 5 pts Orientation to Place - Max 5 pts Registration - Max 3 pts Recall - Max 3 pts Language Repeat - Max 1 pts Language Follow 3 Step Command - Max 3 pts     Immunization History  Administered Date(s) Administered  . Influenza-Unspecified 05/18/2013, 05/11/2016  . PPD Test 02/02/2013, 04/24/2016  . Pneumococcal Conjugate-13 09/09/2014  . Pneumococcal-Unspecified 08/27/2012  . Tdap 01/01/2011  . Zoster 08/13/2012   Screening Tests Health Maintenance  Topic Date Due  . MAMMOGRAM  11/21/2016  .  PNA vac Low Risk Adult (2 of 2 - PPSV23) 08/27/2017  . COLONOSCOPY  07/27/2019  . TETANUS/TDAP  12/31/2020  . INFLUENZA VACCINE  Addressed  . DEXA SCAN  Completed  . Hepatitis C Screening  Completed      Plan:     I have personally  reviewed and addressed the Medicare Annual Wellness questionnaire and have noted the following in the patient's chart:  A. Medical and social history B. Use of alcohol, tobacco or illicit drugs  C. Current medications and supplements D. Functional ability and status E.  Nutritional status F.  Physical activity G. Advance directives H. List of other physicians I.  Hospitalizations, surgeries, and ER visits in previous 12 months J.  Vitals K. Screenings to include hearing, vision, cognitive, depression L. Referrals and appointments - none  In addition, I have reviewed and discussed with patient certain preventive protocols, quality metrics, and best practice recommendations. A written personalized care plan for preventive services as well as general preventive health recommendations were provided to patient.  See attached scanned questionnaire for additional information.   Signed,   Randa Evens, MHA, BS, LPN Health Coach

## 2016-11-13 NOTE — Progress Notes (Signed)
Pre visit review using our clinic review tool, if applicable. No additional management support is needed unless otherwise documented below in the visit note. 

## 2016-11-14 ENCOUNTER — Encounter: Payer: Self-pay | Admitting: Family Medicine

## 2016-11-15 ENCOUNTER — Other Ambulatory Visit: Payer: Self-pay | Admitting: Family Medicine

## 2016-12-04 ENCOUNTER — Ambulatory Visit (INDEPENDENT_AMBULATORY_CARE_PROVIDER_SITE_OTHER): Payer: Medicare HMO | Admitting: Family Medicine

## 2016-12-04 ENCOUNTER — Encounter: Payer: Self-pay | Admitting: Family Medicine

## 2016-12-04 VITALS — BP 128/76 | HR 65 | Temp 98.5°F | Ht 62.5 in | Wt 155.0 lb

## 2016-12-04 DIAGNOSIS — I10 Essential (primary) hypertension: Secondary | ICD-10-CM | POA: Diagnosis not present

## 2016-12-04 DIAGNOSIS — M8580 Other specified disorders of bone density and structure, unspecified site: Secondary | ICD-10-CM

## 2016-12-04 DIAGNOSIS — Z0000 Encounter for general adult medical examination without abnormal findings: Secondary | ICD-10-CM | POA: Diagnosis not present

## 2016-12-04 DIAGNOSIS — E7800 Pure hypercholesterolemia, unspecified: Secondary | ICD-10-CM | POA: Diagnosis not present

## 2016-12-04 DIAGNOSIS — R739 Hyperglycemia, unspecified: Secondary | ICD-10-CM

## 2016-12-04 DIAGNOSIS — E2839 Other primary ovarian failure: Secondary | ICD-10-CM | POA: Insufficient documentation

## 2016-12-04 DIAGNOSIS — E559 Vitamin D deficiency, unspecified: Secondary | ICD-10-CM

## 2016-12-04 MED ORDER — OMEPRAZOLE 20 MG PO CPDR: 20.0000 mg | DELAYED_RELEASE_CAPSULE | Freq: Two times a day (BID) | ORAL | 3 refills | Status: DC

## 2016-12-04 MED ORDER — LISINOPRIL-HYDROCHLOROTHIAZIDE 20-25 MG PO TABS: 1.0000 | ORAL_TABLET | Freq: Every day | ORAL | 3 refills | Status: DC

## 2016-12-04 MED ORDER — SIMVASTATIN 10 MG PO TABS: 20.0000 mg | ORAL_TABLET | Freq: Every day | ORAL | 3 refills | Status: DC

## 2016-12-04 NOTE — Progress Notes (Signed)
Pre visit review using our clinic review tool, if applicable. No additional management support is needed unless otherwise documented below in the visit note. 

## 2016-12-04 NOTE — Assessment & Plan Note (Signed)
bp in fair control at this time  BP Readings from Last 1 Encounters:  12/04/16 128/76   No changes needed Disc lifstyle change with low sodium diet and exercise  Labs reviewed

## 2016-12-04 NOTE — Assessment & Plan Note (Signed)
Lab Results  Component Value Date   HGBA1C 5.4 11/13/2016   Good control disc imp of low glycemic diet and wt loss to prevent DM2

## 2016-12-04 NOTE — Assessment & Plan Note (Signed)
Reviewed health habits including diet and exercise and skin cancer prevention Reviewed appropriate screening tests for age  Also reviewed health mt list, fam hx and immunization status , as well as social and family history   See HPI  Reviewed AMW dexa ordered Mammogram scheduled

## 2016-12-04 NOTE — Assessment & Plan Note (Signed)
Disc goals for lipids and reasons to control them Rev labs with pt Rev low sat fat diet in detail Fair control with zocor and diet

## 2016-12-04 NOTE — Progress Notes (Signed)
Subjective:    Patient ID: Adriana Marks, female    DOB: Jun 10, 1946, 71 y.o.   MRN: [id number]  HPI  Here for health maintenance exam and to review chronic medical problems    Clinical resolution of pneumonia Will return another day next wk for repeat cxr   Wt Readings from Last 3 Encounters:  12/04/16 155 lb (70.3 kg)  11/13/16 154 lb (69.9 kg)  10/11/16 152 lb 8 oz (69.2 kg)  stable   Still working day care job   AMW 3/21 Depression score 4 Says she feels fairly good   Exercise slowed down due to weather-she likes to walk  Has some weights to use indoors and does leg exercises  [facility] to drive   Breast cancer screening 3/17 mammogram neg- has it scheduled for later this month Self breast exam- no lumps or changes  Pap nl 5/11 No gyn symptoms at all   Colonoscopy 12/10 normal-10 y recall    dexa 8/09- BMD in the normal range No falls or fx  Hx of openia in the past  Tries to exercise   D level is 62  bp is stable today  No cp or palpitations or headaches or edema  No side effects to medicines  BP Readings from Last 3 Encounters:  12/04/16 128/76  11/13/16 124/80  10/11/16 128/78      Hx of CAP  Hx of hyperlipidemia Lab Results  Component Value Date   CHOL 183 11/13/2016   CHOL 174 11/06/2015   CHOL 167 09/02/2014   Lab Results  Component Value Date   HDL 57.60 11/13/2016   HDL 57.80 11/06/2015   HDL 47.30 09/02/2014   Lab Results  Component Value Date   LDLCALC 103 (H) 11/13/2016   LDLCALC 97 11/06/2015   LDLCALC 106 (H) 09/02/2014   Lab Results  Component Value Date   TRIG 114.0 11/13/2016   TRIG 95.0 11/06/2015   TRIG 71.0 09/02/2014   Lab Results  Component Value Date   CHOLHDL 3 11/13/2016   CHOLHDL 3 11/06/2015   CHOLHDL 4 09/02/2014   No results found for: LDLDIRECT  zocor and diet Eats very healthy   Hx of hyperglycemia Lab Results  Component Value Date   HGBA1C 5.4 11/13/2016    was prev 5.6  Eating well   Lab Results  Component Value Date   WBC 7.1 11/13/2016   HGB 13.8 11/13/2016   HCT 40.2 11/13/2016   MCV 91.0 11/13/2016   PLT 304.0 11/13/2016     Chemistry      Component Value Date/Time   NA 136 11/13/2016 0950   K 4.3 11/13/2016 0950   CL 100 11/13/2016 0950   CO2 30 11/13/2016 0950   BUN 14 11/13/2016 0950   CREATININE 0.79 11/13/2016 0950      Component Value Date/Time   CALCIUM 10.3 11/13/2016 0950   ALKPHOS 54 11/13/2016 0950   AST 20 11/13/2016 0950   ALT 14 11/13/2016 0950   BILITOT 0.5 11/13/2016 0950     Lab Results  Component Value Date   TSH 2.87 11/13/2016    Patient Active Problem List   Diagnosis Date Noted  . Estrogen deficiency 12/04/2016  . CAP (community acquired pneumonia) 10/11/2016  . Hematuria 10/11/2016  . Infiltrate of lung present on imaging of chest 08/09/2016  . Grief reaction 09/09/2014  . Encounter for Medicare annual wellness exam 09/06/2013  . Allergy  to shrimp 04/27/2013  . Hyperglycemia 08/12/2012  . Other screening mammogram 03/22/2011  . Hyperlipidemia 01/01/2011  . Hypertension 01/01/2011  . GERD (gastroesophageal reflux disease) 01/01/2011  . Routine general medical examination at a health care facility 01/01/2011  . Vitamin D deficiency disease 01/01/2011  . Osteopenia 01/01/2011  . Sleep apnea 01/01/2011  . Anxiety 01/01/2011  . Allergic rhinitis 01/01/2011  . Rosacea 01/01/2011   Past Medical History:  Diagnosis Date  . Allergy   . Bronchitis   . Colon polyps   . Depression   . GERD (gastroesophageal reflux disease)   . Heart murmur   . History of chicken pox   . Hx: UTI (urinary tract infection)   . Hyperlipidemia   . Hypertension   . Migraines   . Rosacea   . Sleep apnea    uses cpap every night    No past surgical history on file. Social History  Substance Use Topics  . Smoking status: Never Smoker  . Smokeless tobacco: Never Used  . Alcohol use 0.0 oz/week      Comment: wine occasional   Family History  Problem Relation Age of Onset  . Heart disease Father   . Hypertension Father   . Diabetes Other   . Heart disease Mother   . Stroke Paternal Grandmother   . Heart disease Paternal Grandfather   . Hypertension Paternal Grandfather   . Breast cancer Paternal Aunt    Allergies  Allergen Reactions  . Shrimp [Shellfish Allergy]     Sob/ swelling    Current Outpatient Prescriptions on File Prior to Visit  Medication Sig Dispense Refill  . aspirin 81 MG tablet Take 81 mg by mouth daily.      . Cholecalciferol (VITAMIN D3) 1000 UNITS CAPS Take 5,000 capsules by mouth daily.     Marland Kitchen EPINEPHrine (EPI-PEN) 0.3 mg/0.3 mL SOAJ injection Inject 0.3 mLs (0.3 mg total) into the muscle once. For allergic reaction 1 Device 3  . loratadine (CLARITIN) 10 MG tablet Take 10 mg by mouth daily.    . Naproxen Sodium (ALEVE PO) Take 1 tablet by mouth as needed.    . [DISCONTINUED] atorvastatin (LIPITOR) 10 MG tablet Take 1 tablet (10 mg total) by mouth daily. 30 tablet 11   No current facility-administered medications on file prior to visit.      Review of Systems Review of Systems  Constitutional: Negative for fever, appetite change, fatigue and unexpected weight change.  Eyes: Negative for pain and visual disturbance.  Respiratory: Negative for cough and shortness of breath.   Cardiovascular: Negative for cp or palpitations    Gastrointestinal: Negative for nausea, diarrhea and constipation.  Genitourinary: Negative for urgency and frequency.  Skin: Negative for pallor or rash   Neurological: Negative for weakness, light-headedness, numbness and headaches.  Hematological: Negative for adenopathy. Does not bruise/bleed easily.  Psychiatric/Behavioral: Negative for dysphoric mood. The patient is not nervous/anxious.         Objective:   Physical Exam  Constitutional: She appears well-developed and well-nourished. No distress.  Well appearing   HENT:    Head: Normocephalic and atraumatic.  Right Ear: External ear normal.  Left Ear: External ear normal.  Mouth/Throat: Oropharynx is clear and moist.  Eyes: Conjunctivae and EOM are normal. Pupils are equal, round, and reactive to light. No scleral icterus.  Neck: Normal range of motion. Neck supple. No JVD present. Carotid bruit is not present. No thyromegaly present.  Cardiovascular: Normal rate, regular rhythm, normal heart  sounds and intact distal pulses.  Exam reveals no gallop.   Pulmonary/Chest: Effort normal and breath sounds normal. No respiratory distress. She has no wheezes. She exhibits no tenderness.  Abdominal: Soft. Bowel sounds are normal. She exhibits no distension, no abdominal bruit and no mass. There is no tenderness.  Genitourinary: No breast swelling, tenderness, discharge or bleeding.  Genitourinary Comments: Breast exam: No mass, nodules, thickening, tenderness, bulging, retraction, inflamation, nipple discharge or skin changes noted.  No axillary or clavicular LA.    Dense breast tissue noted   Musculoskeletal: Normal range of motion. She exhibits no edema or tenderness.  Lymphadenopathy:    She has no cervical adenopathy.  Neurological: She is alert. She has normal reflexes. No cranial nerve deficit. She exhibits normal muscle tone. Coordination normal.  Skin: Skin is warm and dry. No rash noted. No erythema. No pallor.  Lentigines and some sks -trunk /back  Psychiatric: She has a normal mood and affect.  Pleasant and talkative           Assessment & Plan:   Problem List Items Addressed This Visit      Cardiovascular and Mediastinum   Hypertension - Primary    bp in fair control at this time  BP Readings from Last 1 Encounters:  12/04/16 128/76   No changes needed Disc lifstyle change with low sodium diet and exercise  Labs reviewed       Relevant Medications   lisinopril-hydrochlorothiazide (PRINZIDE,ZESTORETIC) 20-25 MG tablet   simvastatin  (ZOCOR) 10 MG tablet     Musculoskeletal and Integument   Osteopenia    Scheduled dexa  Last one in nl range (low prior to that) No falls or fx  Nl D level Disc need for calcium/ vitamin D/ wt bearing exercise and bone density test every 2 y to monitor Disc safety/ fracture risk in detail          Other   Estrogen deficiency   Relevant Orders   DG Bone Density   Hyperglycemia    Lab Results  Component Value Date   HGBA1C 5.4 11/13/2016   Good control disc imp of low glycemic diet and wt loss to prevent DM2       Hyperlipidemia    Disc goals for lipids and reasons to control them Rev labs with pt Rev low sat fat diet in detail Fair control with zocor and diet       Relevant Medications   lisinopril-hydrochlorothiazide (PRINZIDE,ZESTORETIC) 20-25 MG tablet   simvastatin (ZOCOR) 10 MG tablet   Routine general medical examination at a health care facility    Reviewed health habits including diet and exercise and skin cancer prevention Reviewed appropriate screening tests for age  Also reviewed health mt list, fam hx and immunization status , as well as social and family history   See HPI  Reviewed AMW dexa ordered Mammogram scheduled        Vitamin D deficiency disease    Vitamin D level is therapeutic with current supplementation Disc importance of this to bone and overall health Level in 60s

## 2016-12-04 NOTE — Patient Instructions (Addendum)
Get your mammogram as planned We will refer you for dexa on the way out  Follow up for your repeat chest xray when you can  Keep taking good care of yourself

## 2016-12-04 NOTE — Assessment & Plan Note (Signed)
Vitamin D level is therapeutic with current supplementation ?Disc importance of this to bone and overall health ? ?Level in 60s  ? ?

## 2016-12-04 NOTE — Assessment & Plan Note (Signed)
Scheduled dexa  Last one in nl range (low prior to that) No falls or fx  Nl D level Disc need for calcium/ vitamin D/ wt bearing exercise and bone density test every 2 y to monitor Disc safety/ fracture risk in detail

## 2016-12-09 ENCOUNTER — Other Ambulatory Visit: Payer: Self-pay | Admitting: Family Medicine

## 2016-12-17 ENCOUNTER — Ambulatory Visit: Payer: Medicare HMO

## 2017-01-08 ENCOUNTER — Ambulatory Visit
Admission: RE | Admit: 2017-01-08 | Discharge: 2017-01-08 | Disposition: A | Discharge status: Home / Self Care | Payer: Medicare HMO | Source: Ambulatory Visit | Attending: Family Medicine | Admitting: Family Medicine

## 2017-01-08 DIAGNOSIS — E2839 Other primary ovarian failure: Secondary | ICD-10-CM

## 2017-01-08 DIAGNOSIS — Z1231 Encounter for screening mammogram for malignant neoplasm of breast: Secondary | ICD-10-CM | POA: Insufficient documentation

## 2017-01-08 DIAGNOSIS — Z13820 Encounter for screening for osteoporosis: Secondary | ICD-10-CM | POA: Diagnosis not present

## 2017-01-08 DIAGNOSIS — Z780 Asymptomatic menopausal state: Secondary | ICD-10-CM | POA: Diagnosis not present

## 2017-01-08 DIAGNOSIS — Z1321 Encounter for screening for nutritional disorder: Secondary | ICD-10-CM | POA: Diagnosis present

## 2017-01-10 ENCOUNTER — Encounter: Payer: Self-pay | Admitting: Family Medicine

## 2017-04-10 ENCOUNTER — Encounter: Payer: Self-pay | Admitting: Family Medicine

## 2017-04-29 ENCOUNTER — Ambulatory Visit: Payer: Medicare HMO | Admitting: Family Medicine

## 2017-04-30 ENCOUNTER — Ambulatory Visit (INDEPENDENT_AMBULATORY_CARE_PROVIDER_SITE_OTHER): Payer: Medicare HMO | Admitting: Family Medicine

## 2017-04-30 ENCOUNTER — Encounter: Payer: Self-pay | Admitting: Family Medicine

## 2017-04-30 VITALS — BP 135/70 | HR 72 | Temp 98.5°F | Ht 62.5 in | Wt 155.2 lb

## 2017-04-30 DIAGNOSIS — R69 Illness, unspecified: Secondary | ICD-10-CM | POA: Diagnosis not present

## 2017-04-30 DIAGNOSIS — F419 Anxiety disorder, unspecified: Secondary | ICD-10-CM | POA: Diagnosis not present

## 2017-04-30 DIAGNOSIS — J358 Other chronic diseases of tonsils and adenoids: Secondary | ICD-10-CM | POA: Insufficient documentation

## 2017-04-30 NOTE — Patient Instructions (Addendum)
I think you could benefit from some counseling  Benadryl is helpful for sleep if you want to try it   Call your insurance and see how coverage would be and let us know   We will refer you to ENT for throat evaluation

## 2017-04-30 NOTE — Progress Notes (Signed)
Subjective:    Patient ID: Adriana Marks, female    DOB: [birth year], 71 y.o.   MRN: [id number]  HPI Here with throat/tonsil issue   Throat is not sore  She sees a white spot with scant redness (R side)  Noted about 3 weeks when brushing her teeth  ? Tonsil stone (has had them before)  Feels a tickle when she swallows  Does have post nasal drip   She gargles with salt water no improvement   No fever   Feels fine   Does not sleep well  Wakes up with brain racing  Financial worries/ house issues  Needed a roof  Lost husband in 2015  Would be interested to see if insurance would cover some counseling  No SI  Not tearful  Does frequently feel anxious/ keyed up and worried     Patient Active Problem List   Diagnosis Date Noted  . Tonsil asymmetry 04/30/2017  . Estrogen deficiency 12/04/2016  . CAP (community acquired pneumonia) 10/11/2016  . Hematuria 10/11/2016  . Infiltrate of lung present on imaging of chest 08/09/2016  . Grief reaction 09/09/2014  . Encounter for Medicare annual wellness exam 09/06/2013  . 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 Hyperglycemia 08/12/2012  . Other screening mammogram 03/22/2011  . Hyperlipidemia 01/01/2011  . Hypertension 01/01/2011  . GERD (gastroesophageal reflux disease) 01/01/2011  . Routine general medical examination at a health care facility 01/01/2011  . Vitamin D deficiency disease 01/01/2011  . Osteopenia 01/01/2011  . Sleep apnea 01/01/2011  . Anxiety 01/01/2011  . Allergic rhinitis 01/01/2011  . Rosacea 01/01/2011   Past Medical History:  Diagnosis Date  . Allergy   . Bronchitis   . Colon polyps   . Depression   . GERD (gastroesophageal reflux disease)   . Heart murmur   . History of chicken pox   . Hx: UTI (urinary tract infection)   . Hyperlipidemia   . Hypertension   . Migraines   . Rosacea   . Sleep apnea    uses cpap every night    No past surgical history on file. Social History  Substance Use Topics   . Smoking status: Never Smoker  . Smokeless tobacco: Never Used  . Alcohol use 0.0 oz/week     Comment: wine occasional   Family History  Problem Relation Age of Onset  . Heart disease Father   . Hypertension Father   . Diabetes Other   . Heart disease Mother   . Stroke Paternal Grandmother   . Heart disease Paternal Grandfather   . Hypertension Paternal Grandfather   . Breast cancer Paternal Aunt    Allergies  Allergen Reactions  . Shrimp [Shellfish Allergy]     Sob/ swelling    Current Outpatient Prescriptions on File Prior to Visit  Medication Sig Dispense Refill  . aspirin 81 MG tablet Take 81 mg by mouth daily.      . Cholecalciferol (VITAMIN D3) 1000 UNITS CAPS Take 5,000 capsules by mouth daily.     Marland Kitchen EPINEPHrine (EPI-PEN) 0.3 mg/0.3 mL SOAJ injection Inject 0.3 mLs (0.3 mg total) into the muscle once. For allergic reaction 1 Device 3  . lisinopril-hydrochlorothiazide (PRINZIDE,ZESTORETIC) 20-25 MG tablet Take 1 tablet by mouth daily. 90 tablet 3  . loratadine (CLARITIN) 10 MG tablet Take 10 mg by mouth daily.    . Naproxen Sodium (ALEVE PO) Take 1 tablet by mouth as needed.    Marland Kitchen omeprazole (PRILOSEC) 20 MG capsule Take 1  capsule (20 mg total) by mouth 2 (two) times daily. 180 capsule 3  . simvastatin (ZOCOR) 10 MG tablet Take 2 tablets (20 mg total) by mouth at bedtime. 180 tablet 3  . [DISCONTINUED] atorvastatin (LIPITOR) 10 MG tablet Take 1 tablet (10 mg total) by mouth daily. 30 tablet 11   No current facility-administered medications on file prior to visit.     Review of Systems  Constitutional: Negative for activity change, appetite change, fatigue, fever and unexpected weight change.  HENT: Negative for congestion, ear pain, mouth sores, nosebleeds, postnasal drip, rhinorrhea, sinus pressure, sore throat and trouble swallowing.   Eyes: Negative for pain, redness and visual disturbance.  Respiratory: Negative for cough, shortness of breath and wheezing.     Cardiovascular: Negative for chest pain and palpitations.  Gastrointestinal: Negative for abdominal pain, blood in stool, constipation and diarrhea.  Endocrine: Negative for polydipsia and polyuria.  Genitourinary: Negative for dysuria, frequency and urgency.  Musculoskeletal: Negative for arthralgias, back pain and myalgias.  Skin: Negative for pallor and rash.  Allergic/Immunologic: Negative for environmental allergies.  Neurological: Negative for dizziness, syncope and headaches.  Hematological: Negative for adenopathy. Does not bruise/bleed easily.  Psychiatric/Behavioral: Positive for sleep disturbance. Negative for decreased concentration and dysphoric mood. The patient is nervous/anxious.        Objective:   Physical Exam  Constitutional: She appears well-developed and well-nourished. No distress.  Well appearing   HENT:  Head: Normocephalic and atraumatic.  Right Ear: External ear normal.  Left Ear: External ear normal.  Nose: Nose normal.  Mouth/Throat: Oropharynx is clear and moist. No oropharyngeal exudate.  R tonsil is larger than the R with a small dependent polypoid extension (unsure if baseline for her)  No debris/swelling or erythema  Eyes: Pupils are equal, round, and reactive to light. Conjunctivae and EOM are normal. Right eye exhibits no discharge. Left eye exhibits no discharge. No scleral icterus.  Neck: Normal range of motion. Neck supple.  Cardiovascular: Normal rate and regular rhythm.   Pulmonary/Chest: Effort normal and breath sounds normal. No respiratory distress. She has no wheezes. She has no rales.  Lymphadenopathy:    She has no cervical adenopathy.  Neurological: She is alert. No cranial nerve deficit. She exhibits normal muscle tone. Coordination normal.  Skin: Skin is warm and dry. No rash noted. No erythema.  Psychiatric: Her speech is normal and behavior is normal. Thought content normal. Her mood appears anxious. She does not exhibit a  depressed mood.          Assessment & Plan:   Problem List Items Addressed This Visit      Other   Anxiety - Primary    Causing sleep disorder  Still suffering grief from loss of husband Reviewed stressors/ coping techniques/symptoms/ support sources/ tx options and side effects in detail today Offered counseling ref -pt will check on ins coverage Suggested trial of benadryl for sleep with caution  Also exercise /good sleep hygiene and exercise  Avoid caffeine       Tonsil asymmetry    R tonsil has small polypoid extension - unsure if this is her baseline No debris or evidence of infection today  Ref to ENT for eval and opinon      Relevant Orders   Ambulatory referral to ENT

## 2017-05-01 NOTE — Assessment & Plan Note (Signed)
Causing sleep disorder  Still suffering grief from loss of husband Reviewed stressors/ coping techniques/symptoms/ support sources/ tx options and side effects in detail today Offered counseling ref -pt will check on ins coverage Suggested trial of benadryl for sleep with caution  Also exercise /good sleep hygiene and exercise  Avoid caffeine

## 2017-05-01 NOTE — Assessment & Plan Note (Signed)
R tonsil has small polypoid extension - unsure if this is her baseline No debris or evidence of infection today  Ref to ENT for eval and opinon

## 2017-05-02 DIAGNOSIS — R69 Illness, unspecified: Secondary | ICD-10-CM | POA: Diagnosis not present

## 2017-05-13 DIAGNOSIS — J351 Hypertrophy of tonsils: Secondary | ICD-10-CM | POA: Diagnosis not present

## 2017-05-14 ENCOUNTER — Ambulatory Visit (INDEPENDENT_AMBULATORY_CARE_PROVIDER_SITE_OTHER)
Admission: RE | Admit: 2017-05-14 | Discharge: 2017-05-14 | Disposition: A | Discharge status: Home / Self Care | Payer: Medicare HMO | Source: Ambulatory Visit | Attending: Family Medicine | Admitting: Family Medicine

## 2017-05-14 DIAGNOSIS — J181 Lobar pneumonia, unspecified organism: Secondary | ICD-10-CM | POA: Diagnosis not present

## 2017-05-14 DIAGNOSIS — J189 Pneumonia, unspecified organism: Secondary | ICD-10-CM

## 2017-06-22 DIAGNOSIS — R300 Dysuria: Secondary | ICD-10-CM | POA: Diagnosis not present

## 2017-06-22 DIAGNOSIS — M545 Low back pain: Secondary | ICD-10-CM | POA: Diagnosis not present

## 2017-06-22 DIAGNOSIS — R358 Other polyuria: Secondary | ICD-10-CM | POA: Diagnosis not present

## 2017-07-14 ENCOUNTER — Encounter: Payer: Self-pay | Admitting: Primary Care

## 2017-07-14 ENCOUNTER — Ambulatory Visit: Payer: Medicare HMO | Admitting: Primary Care

## 2017-07-14 DIAGNOSIS — J309 Allergic rhinitis, unspecified: Secondary | ICD-10-CM

## 2017-07-14 NOTE — Progress Notes (Signed)
Subjective:    Patient ID: Adriana Marks, female    DOB: Feb 20, 1946, 71 y.o.   MRN: [id number]  HPI  Adriana Marks is a 71 year old female with a history of allergic rhinitis, CAP, influenza who presents today with a chief complaint of nasal congestion. She also reports headache, sinus pressure, sore throat, post nasal drip, mild cough. Her symptoms began 4 days ago. She denies fevers, nausea, shortness of breath. She's been taking Aleve and drinking fluids with some improvement. Overall she's feeling better today.  Review of Systems  Constitutional: Negative for fever.  HENT: Positive for sinus pressure and sore throat. Negative for ear pain.        Ear pressure  Respiratory: Negative for cough and shortness of breath.        Past Medical History:  Diagnosis Date  . Allergy   . Bronchitis   . Colon polyps   . Depression   . GERD (gastroesophageal reflux disease)   . Heart murmur   . History of chicken pox   . Hx: UTI (urinary tract infection)   . Hyperlipidemia   . Hypertension   . Migraines   . Rosacea   . Sleep apnea    uses cpap every night      Social History   Socioeconomic History  . Marital status: Widowed    Spouse name: Not on file  . Number of children: Not on file  . Years of education: Not on file  . Highest education level: Not on file  Social Needs  . Financial resource strain: Not on file  . Food insecurity - worry: Not on file  . Food insecurity - inability: Not on file  . Transportation needs - medical: Not on file  . Transportation needs - non-medical: Not on file  Occupational History  . Occupation: Fish farm managerbank teller    Employer: [employer]  Tobacco Use  . Smoking status: Never Smoker  . Smokeless tobacco: Never Used  Substance and Sexual Activity  . Alcohol use: Yes    Alcohol/week: 0.0 oz    Comment: wine occasional  . Drug use: No  . Sexual activity: No  Other Topics Concern  . 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  Family History  Problem Relation Age of Onset  . Heart disease Father   . Hypertension Father   . Diabetes Other   . Heart disease Mother   . Stroke Paternal Grandmother   . Heart disease Paternal Grandfather   . Hypertension Paternal Grandfather   . Breast cancer Paternal Aunt     Allergies  Allergen Reactions  . Shellfish Allergy Anaphylaxis    Sob/ swelling  Sob/ swelling     Current Outpatient Medications on File Prior to Visit  Medication Sig Dispense Refill  . Ascorbic Acid (VITAMIN C) 100 MG CHEW Chew 1 tablet by mouth daily.    Marland Kitchen. aspirin 81 MG tablet Take 81 mg by mouth daily.      .Marland Kitchen  Cholecalciferol (VITAMIN D3) 1000 UNITS CAPS Take 5,000 capsules by mouth daily.     Marland Kitchen. EPINEPHrine (EPI-PEN) 0.3 mg/0.3 mL SOAJ injection Inject 0.3 mLs (0.3 mg total) into the muscle once. For allergic reaction 1 Device 3  . lisinopril-hydrochlorothiazide (PRINZIDE,ZESTORETIC) 20-25 MG tablet Take 1 tablet by mouth daily. 90 tablet 3  . loratadine (CLARITIN) 10 MG tablet Take 10 mg by mouth daily.    . Naproxen Sodium (ALEVE PO) Take 1 tablet by mouth as needed.    Marland Kitchen. omeprazole (PRILOSEC) 20 MG capsule Take 1 capsule (20 mg total) by mouth 2 (two) times daily. 180 capsule 3  . simvastatin (ZOCOR) 10 MG tablet Take 2 tablets (20 mg total) by mouth at bedtime. 180 tablet 3  . [DISCONTINUED] atorvastatin (LIPITOR) 10 MG tablet Take 1 tablet (10 mg total) by mouth daily. 30 tablet 11   No current facility-administered medications on file prior to visit.     BP 122/78   Pulse 69   Temp 97.9 F (36.6 C) (Oral)   Ht 5' 2.5" (1.588 m)   Wt 155 lb (70.3 kg)   LMP 08/26/1997   SpO2 98%   BMI 27.90 kg/m     Objective:   Physical Exam  Constitutional: She appears well-nourished.  HENT:  Right Ear: Ear canal normal. Tympanic membrane is bulging. Tympanic membrane is not erythematous.  Left Ear: Tympanic membrane and ear canal normal. Tympanic membrane is not erythematous.  Nose: Mucosal edema present. Right sinus exhibits maxillary sinus tenderness. Right sinus exhibits no frontal sinus tenderness. Left sinus exhibits maxillary sinus tenderness. Left sinus exhibits no frontal sinus tenderness.  Mouth/Throat: Oropharynx is clear and moist.  Eyes: Conjunctivae are normal.  Neck: Neck supple.  Cardiovascular: Normal rate and regular rhythm.  Pulmonary/Chest: Effort normal and breath sounds normal. She has no wheezes. She has no rales.  Lymphadenopathy:    She has no cervical adenopathy.  Skin: Skin is warm and dry.          Assessment & Plan:

## 2017-07-14 NOTE — Assessment & Plan Note (Signed)
Symptoms today representative of allergy involvement. Discussed to start daily antihistamine; can use Flonase, Robitussin PRN. Discussed return precautions, she verbalized understanding.

## 2017-07-14 NOTE — Patient Instructions (Signed)
Your symptoms and exam are representative of allergy symptoms.  Start Claritin tablets once daily to help dry up the drainage and congestion.  Nasal Congestion/Ear Pressure: Try using Flonase (fluticasone) nasal spray. Instill 1 spray in each nostril twice daily.   You may take Robitussin as needed for cough.  Please notify me if you develop persistent fevers of 101, start coughing up green mucous, notice increased fatigue or weakness, or feel worse after 1 week of onset of symptoms.   It was a pleasure meeting you!

## 2017-07-23 ENCOUNTER — Telehealth: Payer: Self-pay | Admitting: Family Medicine

## 2017-07-23 DIAGNOSIS — J019 Acute sinusitis, unspecified: Secondary | ICD-10-CM

## 2017-07-23 MED ORDER — AMOXICILLIN-POT CLAVULANATE 875-125 MG PO TABS: 1.0000 | ORAL_TABLET | Freq: Two times a day (BID) | ORAL | 0 refills | Status: DC

## 2017-07-23 NOTE — Telephone Encounter (Signed)
Noted.  Will treat for sinusitis based off of last visit. Start Augmentin antibiotics. Take 1 tablet by mouth twice daily for 10 days. This was sent to [facility].

## 2017-07-23 NOTE — Telephone Encounter (Signed)
Copied from CRM [phone]#[id number]. Topic: Quick Communication - See Telephone Encounter >> Jul 23, 2017  1:55 PM Diana EvesHoyt, Maryann B wrote: CRM for notification. See Telephone encounter for:  Pt was seen 11/19 by Chestine Sporelark. Pt was told it was a sinus infection and to just gargle salt water and use flonaz. Now she is seeing a spot on her tonsils and her throat is still soar more so at night. Pt is wanting to know if an antibiotic called in for her. Ok to leave detailed message pt is going in to work or send her something via my chart. If something is called in rx [facility] to [facility].  07/23/17.

## 2017-07-24 NOTE — Telephone Encounter (Signed)
Per DPR, left detail message of Kate's comments for patient to call back. 

## 2017-08-02 ENCOUNTER — Other Ambulatory Visit: Payer: Self-pay | Admitting: Family Medicine

## 2017-08-06 ENCOUNTER — Telehealth: Payer: Self-pay | Admitting: Family Medicine

## 2017-08-06 NOTE — Telephone Encounter (Signed)
Copied from CRM [phone]. Topic: Quick Communication - Rx Refill/Question >> Aug 06, 2017  8:34 AM Gerrianne ScalePayne, Fallon Howerter L wrote: Has the patient contacted their pharmacy? Yes.     (Agent: If no, request that the patient contact the pharmacy for the refill.)  simvastatin (ZOCOR) 20 MG tablet  Preferred Pharmacy (with phone number or street name): [facility] [id number] [place] [address] [phone] (Phone)    Agent: Please be advised that RX refills may take up to 3 business days. We ask that you follow-up with your pharmacy.

## 2017-08-22 IMAGING — MG MM DIGITAL SCREENING BILAT W/ TOMO W/ CAD
8 of 12 series · 8 of 28 positions shown · non-contrast
Comparison: Previous exam(s).

CLINICAL DATA: Screening.

EXAM:
2D DIGITAL SCREENING BILATERAL MAMMOGRAM WITH CAD AND ADJUNCT TOMO

[R CC synth-2D]
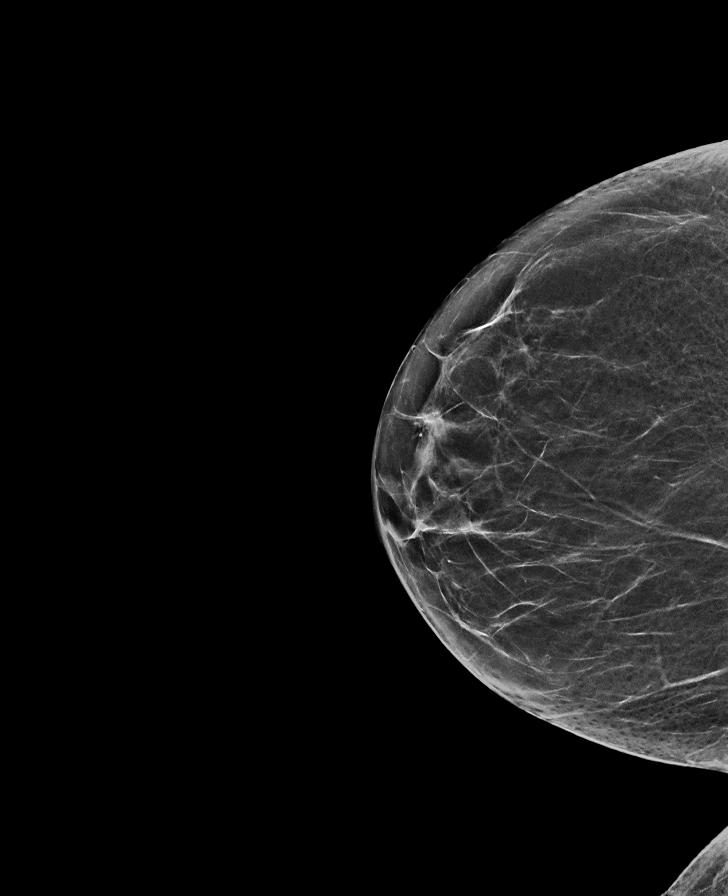

[L CC synth-2D]
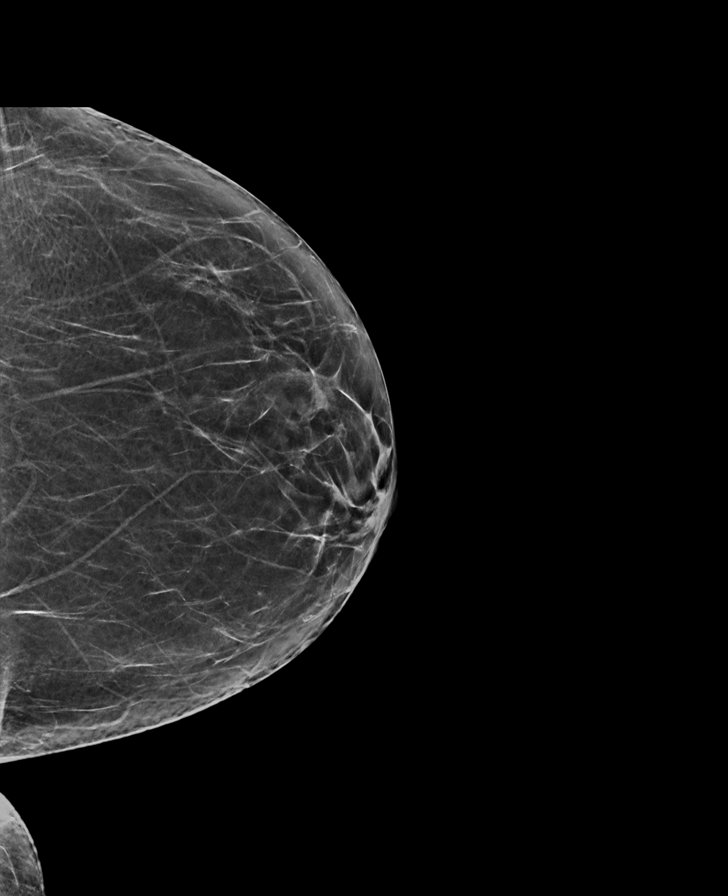

[L MLO synth-2D]
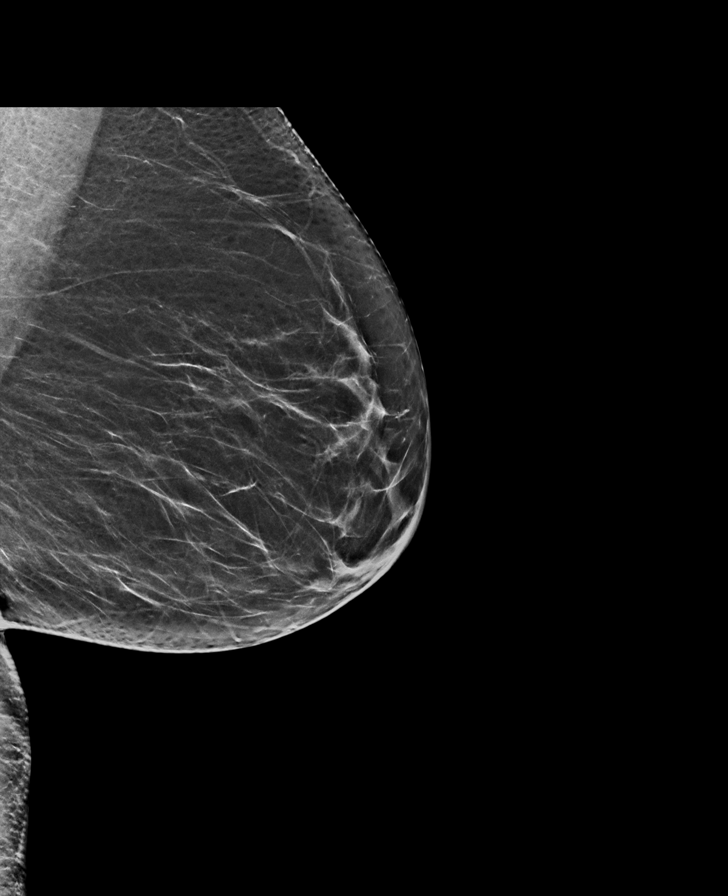

[R MLO synth-2D]
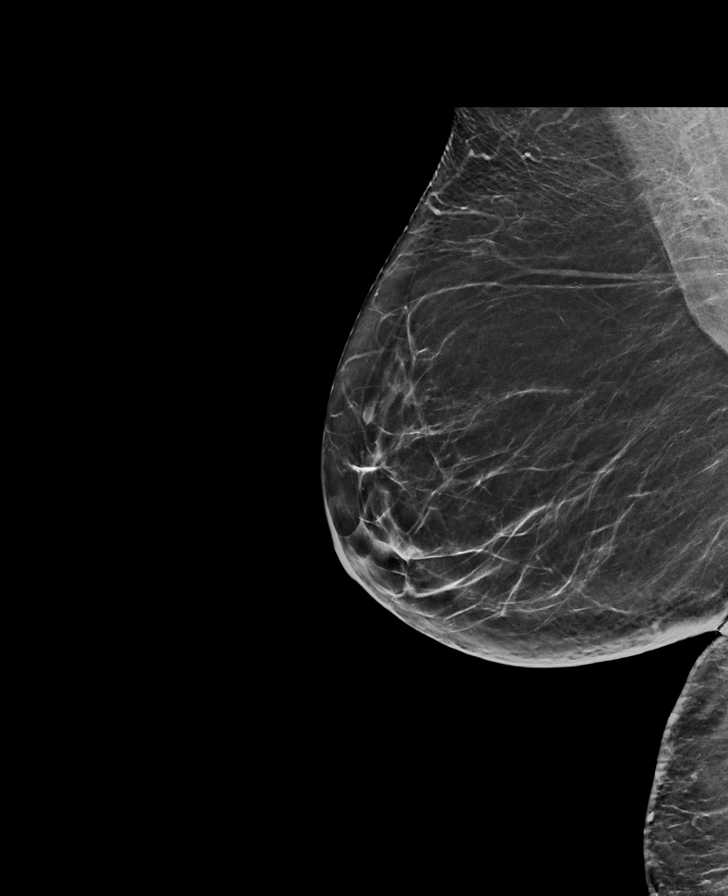

[R MLO]
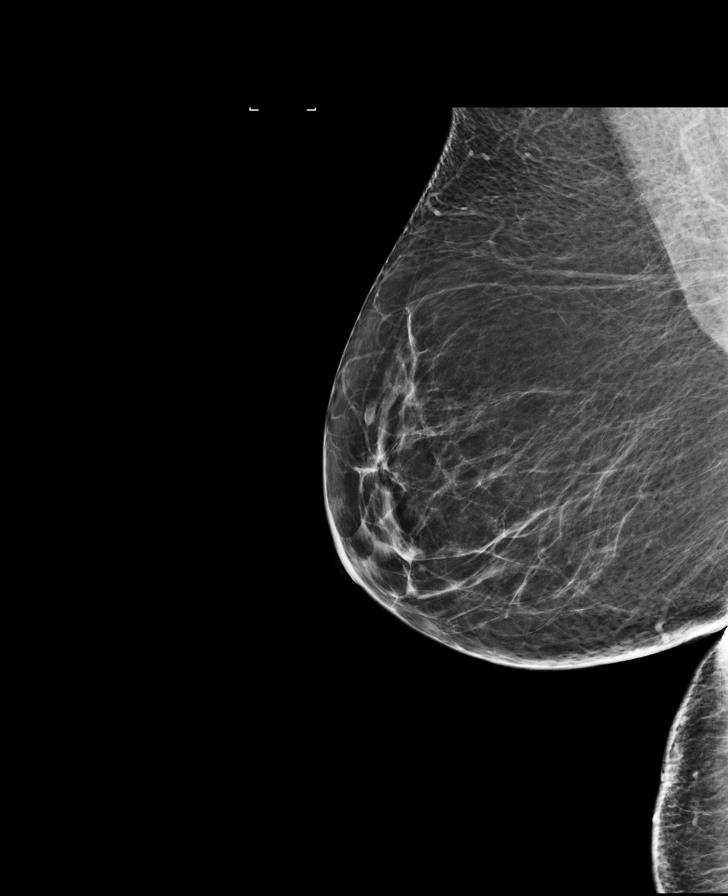

[L CC]
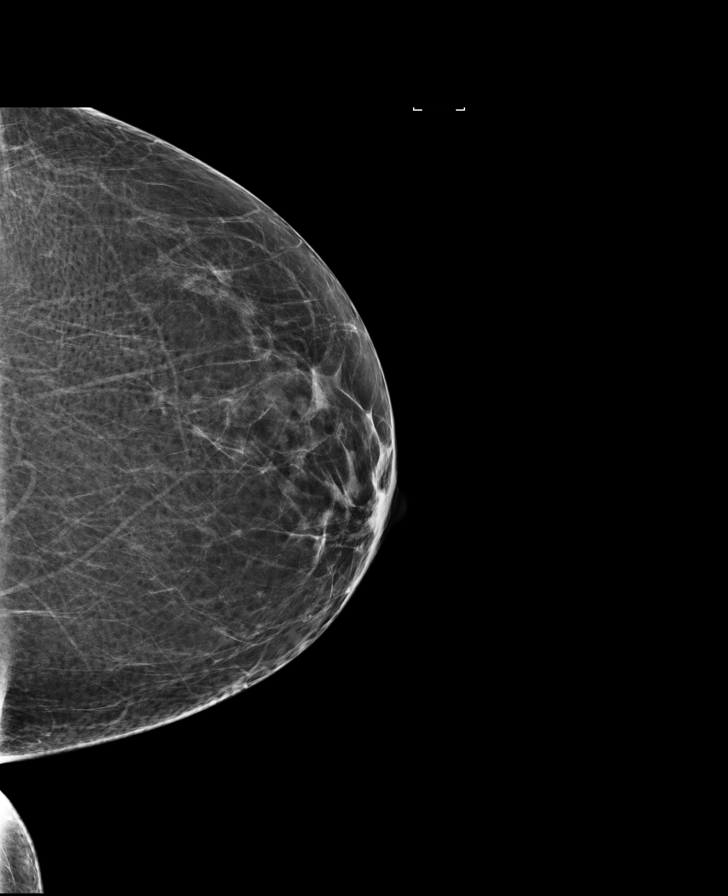

[L MLO]
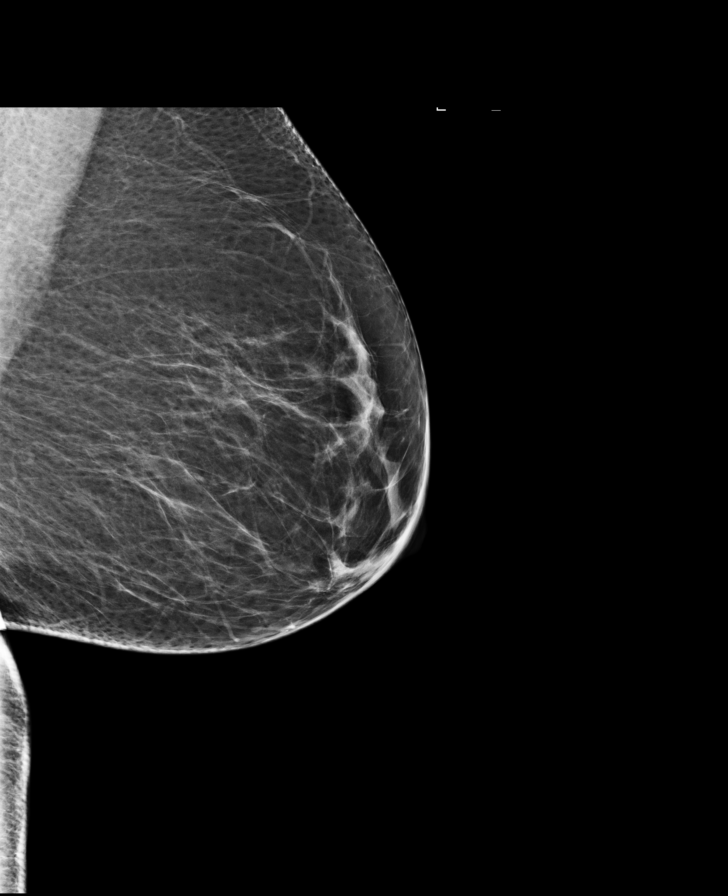

[R CC]
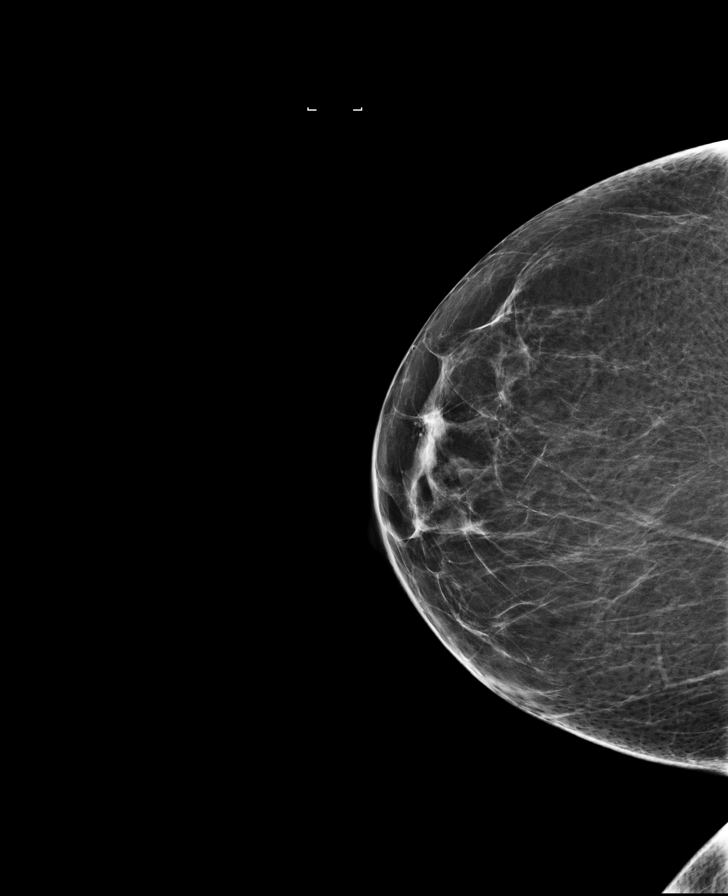

[8 of 28 positions shown; findings below may reference images not displayed]

ACR Breast Density Category b: There are scattered areas of
fibroglandular density.
FINDINGS: There are no findings suspicious for malignancy. Images were
processed with CAD.
IMPRESSION: No mammographic evidence of malignancy. A result letter of this
screening mammogram will be mailed directly to the patient.

RECOMMENDATION:
Screening mammogram in one year. (Code:97-6-RS4)

BI-RADS CATEGORY  1: Negative.

## 2017-09-05 ENCOUNTER — Telehealth: Payer: Self-pay | Admitting: Internal Medicine

## 2017-09-05 NOTE — Telephone Encounter (Signed)
3 attempts to schedule fu appt from recall list.   Deleting recall.   

## 2017-11-09 ENCOUNTER — Telehealth: Payer: Self-pay | Admitting: Family Medicine

## 2017-11-09 DIAGNOSIS — R739 Hyperglycemia, unspecified: Secondary | ICD-10-CM

## 2017-11-09 DIAGNOSIS — I10 Essential (primary) hypertension: Secondary | ICD-10-CM

## 2017-11-09 DIAGNOSIS — E559 Vitamin D deficiency, unspecified: Secondary | ICD-10-CM

## 2017-11-09 DIAGNOSIS — E7800 Pure hypercholesterolemia, unspecified: Secondary | ICD-10-CM

## 2017-11-09 NOTE — Telephone Encounter (Signed)
-----   Message from Robert Bellowarlesia R Pinson, LPN sent at 1/61/09603/15/2019  4:45 PM EDT ----- Regarding: Labs 3/21 Lab orders needed. Thank you.  Insurance:  [employer]

## 2017-11-14 ENCOUNTER — Ambulatory Visit (INDEPENDENT_AMBULATORY_CARE_PROVIDER_SITE_OTHER): Payer: Medicare HMO

## 2017-11-14 VITALS — BP 126/80 | HR 70 | Temp 98.6°F | Ht 62.25 in | Wt 156.5 lb

## 2017-11-14 DIAGNOSIS — Z0000 Encounter for general adult medical examination without abnormal findings: Secondary | ICD-10-CM | POA: Diagnosis not present

## 2017-11-14 DIAGNOSIS — R739 Hyperglycemia, unspecified: Secondary | ICD-10-CM | POA: Diagnosis not present

## 2017-11-14 DIAGNOSIS — E559 Vitamin D deficiency, unspecified: Secondary | ICD-10-CM

## 2017-11-14 DIAGNOSIS — Z23 Encounter for immunization: Secondary | ICD-10-CM | POA: Diagnosis not present

## 2017-11-14 DIAGNOSIS — E7800 Pure hypercholesterolemia, unspecified: Secondary | ICD-10-CM | POA: Diagnosis not present

## 2017-11-14 DIAGNOSIS — I10 Essential (primary) hypertension: Secondary | ICD-10-CM | POA: Diagnosis not present

## 2017-11-14 LAB — CBC WITH DIFFERENTIAL/PLATELET
Basophils Absolute: 0.1 10*3/uL (ref 0.0–0.1)
Basophils Relative: 0.7 % (ref 0.0–3.0)
Eosinophils Absolute: 0.2 10*3/uL (ref 0.0–0.7)
Eosinophils Relative: 2.2 % (ref 0.0–5.0)
HCT: 41.8 % (ref 36.0–46.0)
Hemoglobin: 14.5 g/dL (ref 12.0–15.0)
Lymphocytes Relative: 23.6 % (ref 12.0–46.0)
Lymphs Abs: 1.9 10*3/uL (ref 0.7–4.0)
MCHC: 34.8 g/dL (ref 30.0–36.0)
MCV: 89.7 fl (ref 78.0–100.0)
Monocytes Absolute: 0.7 10*3/uL (ref 0.1–1.0)
Monocytes Relative: 8.5 % (ref 3.0–12.0)
Neutro Abs: 5.3 10*3/uL (ref 1.4–7.7)
Neutrophils Relative %: 65 % (ref 43.0–77.0)
Platelets: 318 10*3/uL (ref 150.0–400.0)
RBC: 4.66 Mil/uL (ref 3.87–5.11)
RDW: 13.2 % (ref 11.5–15.5)
WBC: 8.1 10*3/uL (ref 4.0–10.5)

## 2017-11-14 LAB — COMPREHENSIVE METABOLIC PANEL
ALT: 20 U/L (ref 0–35)
AST: 25 U/L (ref 0–37)
Albumin: 4.4 g/dL (ref 3.5–5.2)
Alkaline Phosphatase: 61 U/L (ref 39–117)
BUN: 16 mg/dL (ref 6–23)
CO2: 26 mEq/L (ref 19–32)
Calcium: 10.1 mg/dL (ref 8.4–10.5)
Chloride: 97 mEq/L (ref 96–112)
Creatinine, Ser: 0.74 mg/dL (ref 0.40–1.20)
GFR: 82.48 mL/min (ref >60.00)
Glucose, Bld: 101 mg/dL — ABNORMAL HIGH (ref 70–99)
Potassium: 4.1 mEq/L (ref 3.5–5.1)
Sodium: 135 mEq/L (ref 135–145)
Total Bilirubin: 0.6 mg/dL (ref 0.2–1.2)
Total Protein: 7.7 g/dL (ref 6.0–8.3)

## 2017-11-14 LAB — LIPID PANEL
Cholesterol: 158 mg/dL (ref 0–200)
HDL: 61.5 mg/dL (ref >39.00)
LDL Cholesterol: 86 mg/dL (ref 0–99)
NonHDL: 96.68
Total CHOL/HDL Ratio: 3
Triglycerides: 52 mg/dL (ref 0.0–149.0)
VLDL: 10.4 mg/dL (ref 0.0–40.0)

## 2017-11-14 LAB — HEMOGLOBIN A1C: Hgb A1c MFr Bld: 5.5 % (ref 4.6–6.5)

## 2017-11-14 LAB — TSH: TSH: 2.46 u[IU]/mL (ref 0.35–4.50)

## 2017-11-14 LAB — VITAMIN D 25 HYDROXY (VIT D DEFICIENCY, FRACTURES): VITD: 63.31 ng/mL (ref 30.00–100.00)

## 2017-11-14 NOTE — Progress Notes (Signed)
I reviewed health advisor's note, was available for consultation, and agree with documentation and plan.  

## 2017-11-14 NOTE — Patient Instructions (Signed)
Adriana Marks , Thank you for taking time to come for your Medicare Wellness Visit. I appreciate your ongoing commitment to your health goals. Please review the following plan we discussed and let me know if I can assist you in the future.   These are the goals we discussed: Goals      Patient Stated   . emotional health (pt-stated)     Starting 11/06/2015, I will spend 1 hour daily doing an activity just for my own personal mental health.         Other   . Increase physical activity     When weather permits, I will resume walking for 30 minutes daily.        This is a list of the screening recommended for you and due dates:  Health Maintenance  Topic Date Due  . Mammogram  01/08/2018  . Colon Cancer Screening  07/27/2019  . Tetanus Vaccine  12/31/2020  . Flu Shot  Completed  . DEXA scan (bone density measurement)  Completed  .  Hepatitis C: One time screening is recommended by Center for Disease Control  (CDC) for  adults born from [birth year] through 1963.   Completed  . Pneumonia vaccines  Completed   Preventive Care for Adults  A healthy lifestyle and preventive care can promote health and wellness. Preventive health guidelines for adults include the following key practices.  . A routine yearly physical is a good way to check with your health care provider about your health and preventive screening. It is a chance to share any concerns and updates on your health and to receive a thorough exam.  . Visit your dentist for a routine exam and preventive care every 6 months. Brush your teeth twice a day and floss once a day. Good oral hygiene prevents tooth decay and gum disease.  . The frequency of eye exams is based on your age, health, family medical history, use  of contact lenses, and other factors. Follow your health care provider's recommendations for frequency of eye exams.  . Eat a healthy diet. Foods like vegetables, fruits, whole grains, low-fat dairy products, and lean  protein foods contain the nutrients you need without too many calories. Decrease your intake of foods high in solid fats, added sugars, and salt. Eat the right amount of calories for you. Get information about a proper diet from your health care provider, if necessary.  . Regular physical exercise is one of the most important things you can do for your health. Most adults should get at least 150 minutes of moderate-intensity exercise (any activity that increases your heart rate and causes you to sweat) each week. In addition, most adults need muscle-strengthening exercises on 2 or more days a week.  Silver Sneakers may be a benefit available to you. To determine eligibility, you may visit the website: www.silversneakers.com or contact program at [phone] Mon-Fri between 8AM-8PM.   . Maintain a healthy weight. The body mass index (BMI) is a screening tool to identify possible weight problems. It provides an estimate of body fat based on height and weight. Your health care provider can find your BMI and can help you achieve or maintain a healthy weight.   For adults 20 years and older: ? A BMI below 18.5 is considered underweight. ? A BMI of 18.5 to 24.9 is normal. ? A BMI of 25 to 29.9 is considered overweight. ? A BMI of 30 and above is considered obese.   . Maintain normal blood  lipids and cholesterol levels by exercising and minimizing your intake of saturated fat. Eat a balanced diet with plenty of fruit and vegetables. Blood tests for lipids and cholesterol should begin at age 66 and be repeated every 5 years. If your lipid or cholesterol levels are high, you are over 50, or you are at high risk for heart disease, you may need your cholesterol levels checked more frequently. Ongoing high lipid and cholesterol levels should be treated with medicines if diet and exercise are not working.  . If you smoke, find out from your health care provider how to quit. If you do not use tobacco, please  do not start.  . If you choose to drink alcohol, please do not consume more than 2 drinks per day. One drink is considered to be 12 ounces (355 mL) of beer, 5 ounces (148 mL) of wine, or 1.5 ounces (44 mL) of liquor.  . If you are 73-81 years old, ask your health care provider if you should take aspirin to prevent strokes.  . Use sunscreen. Apply sunscreen liberally and repeatedly throughout the day. You should seek shade when your shadow is shorter than you. Protect yourself by wearing long sleeves, pants, a wide-brimmed hat, and sunglasses year round, whenever you are outdoors.  . Once a month, do a whole body skin exam, using a mirror to look at the skin on your back. Tell your health care provider of new moles, moles that have irregular borders, moles that are larger than a pencil eraser, or moles that have changed in shape or color.

## 2017-11-14 NOTE — Progress Notes (Signed)
PCP notes:   Health maintenance:  PPSV23 - administered  Abnormal screenings:   Fall risk - hx of single fall Fall Risk  11/14/2017 11/13/2016 11/06/2015 09/09/2014 09/06/2013  Falls in the past year? Yes No Yes No Yes  Number falls in past yr: 1 - 1 - 1  Injury with Fall? No - No - No  Follow up - - Falls evaluation completed - -   Depression score: 4 [facility] 11/14/2017 11/13/2016 11/06/2015 09/09/2014 09/06/2013  Decreased Interest 1 1 2  0 0  Down, Depressed, Hopeless 1 1 2 1  0  PHQ - 2 Score 2 2 4 1  0  Altered sleeping 1 1 0 - -  Tired, decreased energy 1 0 0 - -  Change in appetite 0 0 0 - -  Feeling bad or failure about yourself  0 1 0 - -  Trouble concentrating 0 0 0 - -  Moving slowly or fidgety/restless 0 0 0 - -  Suicidal thoughts 0 0 0 - -  PHQ-9 Score 4 4 4  - -  Difficult doing work/chores Not difficult at all Not difficult at all Not difficult at all - -   Patient concerns:   None  Nurse concerns:  None  Next PCP appt:   12/08/17 @ 1045

## 2017-11-14 NOTE — Progress Notes (Signed)
Subjective:   Adriana Marks is a 72 y.o. female who presents for Medicare Annual (Subsequent) preventive examination.  Review of Systems:  N/A Cardiac Risk Factors include: advanced age (>29men, >101 women);dyslipidemia;hypertension     Objective:     Vitals: BP 126/80 (BP Location: Right Arm, Patient Position: Sitting, Cuff Size: Normal)   Pulse 70   Temp 98.6 F (37 C) (Oral)   Ht 5' 2.25" (1.581 m) Comment: no shoes  Wt 156 lb 8 oz (71 kg)   LMP 08/26/1997   SpO2 97%   BMI 28.39 kg/m   Body mass index is 28.39 kg/m.  Advanced Directives 11/13/2016 11/06/2015  Does Patient Have a Medical Advance Directive? Yes Yes  Type of Estate agent of [place];Living will Healthcare Power of [place];Living will  Does patient want to make changes to medical advance directive? - No - Patient declined  Copy of Healthcare Power of Attorney in Chart? No - copy requested No - copy requested    Tobacco Social History   Tobacco Use  Smoking Status Never Smoker  Smokeless Tobacco Never Used     Counseling given: No   Clinical Intake:  Pre-visit preparation completed: Yes  Pain : No/denies pain Pain Score: 0-No pain     Nutritional Status: BMI 25 -29 Overweight Nutritional Risks: None Diabetes: No  How often do you need to have someone help you when you read instructions, pamphlets, or other written materials from your doctor or pharmacy?: 1 - Never What is the last grade level you completed in school?: 12th grade + 2 yrs technical college  Interpreter Needed?: No  Comments: pt is a widow and lives alone Information entered by :: LPinson, LPN  Past Medical History:  Diagnosis Date  . Allergy   . Bronchitis   . Colon polyps   . Depression   . GERD (gastroesophageal reflux disease)   . Heart murmur   . History of chicken pox   . Hx: UTI (urinary tract infection)   . Hyperlipidemia   . Hypertension   . Migraines   . Rosacea   . Sleep apnea      uses cpap every night    History reviewed. No pertinent surgical history. Family History  Problem Relation Age of Onset  . Heart disease Father   . Hypertension Father   . Diabetes Other   . Heart disease Mother   . Stroke Paternal Grandmother   . Heart disease Paternal Grandfather   . Hypertension Paternal Grandfather   . Breast cancer Paternal Aunt    Social History   Socioeconomic History  . Marital status: Widowed    Spouse name: Not on file  . Number of children: Not on file  . Years of education: Not on file  . Highest education level: Not on file  Occupational History  . Occupation: Fish farm manager: [employer]  Social Needs  . Financial resource strain: Not on file  . Food insecurity:    Worry: Not on file    Inability: Not on file  . Transportation needs:    Medical: Not on file    Non-medical: Not on file  Tobacco Use  . Smoking status: Never Smoker  . Smokeless tobacco: Never Used  Substance and Sexual Activity  . Alcohol use: Yes    Alcohol/week: 0.0 oz    Comment: wine occasional  . Drug use: No  . Sexual activity: Never  Lifestyle  . Physical activity:  Days per week: Not on file    Minutes per session: Not on file  . Stress: Not on file  Relationships  . Social connections:    Talks on phone: Not on file    Gets together: Not on file    Attends religious service: Not on file    Active member of club or organization: Not on file    Attends meetings of clubs or organizations: Not on file    Relationship status: Not on file  Other Topics Concern  . 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 Ascorbic  Acid (VITAMIN C) 100 MG CHEW Chew 1 tablet by mouth daily.  Marland Kitchen. aspirin 81 MG tablet Take 81 mg by mouth daily.    . Cholecalciferol (VITAMIN D3) 1000 UNITS CAPS Take 5,000 capsules by mouth daily.   Marland Kitchen. EPINEPHrine (EPI-PEN) 0.3 mg/0.3 mL SOAJ injection Inject 0.3 mLs (0.3 mg total) into the muscle once. For allergic reaction  . lisinopril-hydrochlorothiazide (PRINZIDE,ZESTORETIC) 20-25 MG tablet Take 1 tablet by mouth daily.  Marland Kitchen. loratadine (CLARITIN) 10 MG tablet Take 10 mg by mouth daily.  . Naproxen Sodium (ALEVE PO) Take 1 tablet by mouth as needed.  Marland Kitchen. omeprazole (PRILOSEC) 20 MG capsule Take 1 capsule (20 mg total) by mouth 2 (two) times daily.  . simvastatin (ZOCOR) 20 MG tablet TAKE 1 TABLET BY MOUTH AT BEDTIME  . [DISCONTINUED] amoxicillin-clavulanate (AUGMENTIN) 875-125 MG tablet Take 1 tablet by mouth 2 (two) times daily.  . [DISCONTINUED] atorvastatin (LIPITOR) 10 MG tablet Take 1 tablet (10 mg total) by mouth daily.   No facility-administered encounter medications on file as of 11/14/2017.     Activities of Daily Living In your present state of health, do you have any difficulty performing the following activities: 11/14/2017  Hearing? N  Vision? N  Difficulty concentrating or making decisions? N  Walking or climbing stairs? N  Dressing or bathing? N  Doing errands, shopping? N  Preparing Food and eating ? N  Using the Toilet? N  In the past six months, have you accidently leaked urine? N  Do you have problems with loss of bowel control? N  Managing your Medications? N  Managing your Finances? N  Housekeeping or managing your Housekeeping? N  Some recent data might be hidden    Patient Care Team: [name], Audrie GallusMarne A, MD as PCP - General (Family Medicine)    Assessment:   This is a routine wellness examination for [facility].   Hearing Screening   125Hz  250Hz  500Hz  1000Hz  2000Hz  3000Hz  4000Hz  6000Hz  8000Hz   Right ear:   40 40 40  40    Left ear:   40 40 40  40    Vision  Screening Comments: [facility]    Exercise Activities and Dietary recommendations Current Exercise Habits: The patient does not participate in regular exercise at present, Exercise limited by: None identified  Goals      Patient  Stated   . emotional health (pt-stated)     Starting 11/06/2015, I will spend 1 hour daily doing an activity just for my own personal mental health.         Other   . Increase physical activity     When weather permits, I will resume walking for 30 minutes daily.        Fall Risk Fall Risk  11/14/2017 11/13/2016 11/06/2015 09/09/2014 09/06/2013  Falls in the past year? Yes No Yes No Yes  Number falls in past yr: 1 - 1 - 1  Injury with Fall? No - No - No  Follow up - - Falls evaluation completed - -   Depression Screen PHQ 2/9 Scores 11/14/2017 11/13/2016 11/06/2015 09/09/2014  PHQ - 2 Score 2 2 4 1   PHQ- 9 Score 4 4 4  -     Cognitive Function MMSE - Mini Mental State Exam 11/14/2017 11/13/2016 11/06/2015  Orientation to time 5 5 5   Orientation to Place 5 5 5   Registration 3 3 3   Attention/ Calculation 0 0 5  Recall 3 3 3   Language- name 2 objects 0 0 0  Language- repeat 1 1 1   Language- follow 3 step command 3 3 3   Language- read & follow direction 0 0 1  Write a sentence 0 0 0  Copy design 0 0 0  Total score 20 20 26      PLEASE NOTE: A Mini-Cog screen was completed. Maximum score is 20. A value of 0 denotes this part of [place] MMSE was not completed or the patient failed this part of the Mini-Cog screening.   Mini-Cog Screening Orientation to Time - Max 5 pts Orientation to Place - Max 5 pts Registration - Max 3 pts Recall - Max 3 pts Language Repeat - Max 1 pts Language Follow 3 Step Command - Max 3 pts     Immunization History  Administered Date(s) Administered  . Influenza, High Dose Seasonal PF 05/02/2017  . Influenza-Unspecified 05/18/2013, 05/11/2016  . PPD Test 02/02/2013, 04/24/2016  .  Pneumococcal Conjugate-13 09/09/2014  . Pneumococcal Polysaccharide-23 11/14/2017  . Pneumococcal-Unspecified 08/27/2012  . Tdap 01/01/2011  . Zoster 08/13/2012    Screening Tests Health Maintenance  Topic Date Due  . MAMMOGRAM  01/08/2018  . COLONOSCOPY  07/27/2019  . TETANUS/TDAP  12/31/2020  . INFLUENZA VACCINE  Completed  . DEXA SCAN  Completed  . Hepatitis C Screening  Completed  . PNA vac Low Risk Adult  Completed       Plan:     I have personally reviewed, addressed, and noted the following in the patient's chart:  A. Medical and social history B. Use of alcohol, tobacco or illicit drugs  C. Current medications and supplements D. Functional ability and status E.  Nutritional status F.  Physical activity G. Advance directives H. List of other physicians I.  Hospitalizations, surgeries, and ER visits in previous 12 months J.  Vitals K. Screenings to include hearing, vision, cognitive, depression L. Referrals and appointments - none  In addition, I have reviewed and discussed with patient certain preventive protocols, quality metrics, and best practice recommendations. A written personalized care plan for preventive services as well as general preventive health recommendations were provided to patient.  See attached scanned questionnaire for additional information.   Signed,   Randa Evens, MHA, BS, LPN Health Coach

## 2017-11-16 ENCOUNTER — Encounter: Payer: Self-pay | Admitting: Family Medicine

## 2017-11-21 DIAGNOSIS — H2513 Age-related nuclear cataract, bilateral: Secondary | ICD-10-CM | POA: Diagnosis not present

## 2017-11-21 DIAGNOSIS — Z0100 Encounter for examination of eyes and vision without abnormal findings: Secondary | ICD-10-CM | POA: Diagnosis not present

## 2017-11-21 DIAGNOSIS — Z0101 Encounter for examination of eyes and vision with abnormal findings: Secondary | ICD-10-CM | POA: Diagnosis not present

## 2017-12-08 ENCOUNTER — Ambulatory Visit (INDEPENDENT_AMBULATORY_CARE_PROVIDER_SITE_OTHER): Payer: Medicare HMO | Admitting: Family Medicine

## 2017-12-08 ENCOUNTER — Encounter: Payer: Self-pay | Admitting: Family Medicine

## 2017-12-08 VITALS — BP 132/68 | HR 72 | Temp 98.0°F | Ht 62.25 in | Wt 161.5 lb

## 2017-12-08 DIAGNOSIS — Z0000 Encounter for general adult medical examination without abnormal findings: Secondary | ICD-10-CM

## 2017-12-08 DIAGNOSIS — Z1231 Encounter for screening mammogram for malignant neoplasm of breast: Secondary | ICD-10-CM | POA: Diagnosis not present

## 2017-12-08 DIAGNOSIS — M8580 Other specified disorders of bone density and structure, unspecified site: Secondary | ICD-10-CM | POA: Diagnosis not present

## 2017-12-08 DIAGNOSIS — F419 Anxiety disorder, unspecified: Secondary | ICD-10-CM | POA: Diagnosis not present

## 2017-12-08 DIAGNOSIS — I10 Essential (primary) hypertension: Secondary | ICD-10-CM | POA: Diagnosis not present

## 2017-12-08 DIAGNOSIS — E559 Vitamin D deficiency, unspecified: Secondary | ICD-10-CM

## 2017-12-08 DIAGNOSIS — J309 Allergic rhinitis, unspecified: Secondary | ICD-10-CM | POA: Diagnosis not present

## 2017-12-08 DIAGNOSIS — E7800 Pure hypercholesterolemia, unspecified: Secondary | ICD-10-CM | POA: Diagnosis not present

## 2017-12-08 DIAGNOSIS — R739 Hyperglycemia, unspecified: Secondary | ICD-10-CM

## 2017-12-08 DIAGNOSIS — R69 Illness, unspecified: Secondary | ICD-10-CM | POA: Diagnosis not present

## 2017-12-08 MED ORDER — OMEPRAZOLE 20 MG PO CPDR: 20.0000 mg | DELAYED_RELEASE_CAPSULE | Freq: Two times a day (BID) | ORAL | 3 refills | Status: DC

## 2017-12-08 MED ORDER — LISINOPRIL-HYDROCHLOROTHIAZIDE 20-25 MG PO TABS: 1.0000 | ORAL_TABLET | Freq: Every day | ORAL | 3 refills | Status: DC

## 2017-12-08 MED ORDER — SIMVASTATIN 20 MG PO TABS: 20.0000 mg | ORAL_TABLET | Freq: Every day | ORAL | 3 refills | Status: DC

## 2017-12-08 NOTE — Progress Notes (Signed)
Subjective:    Patient ID: Adriana Marks, female    DOB: [birth year], 72 y.o.   MRN: [id number]  Here for health maintenance exam and to review chronic medical problems    Lots of allergy symptoms (burns and stings in allergies)  pnd and lots of mucous -clear to white  No wheezing  No fever  No facial pain  Uses flonase  claritin 10 mg otc   Does not sleep well  Anxiety/grief - on and off     Wt Readings from Last 3 Encounters:  12/08/17 161 lb 8 oz (73.3 kg)  11/14/17 156 lb 8 oz (71 kg)  07/14/17 155 lb (70.3 kg)  up 6 lb since November  Diet is good overall / mindful of what she eats  Exercise - hard to get out in bad weather  Dances indoors for exercise  29.30 kg/m   amw was on 3/22 One fall in the last year -no injury (a trip and fall with her dogs)  Given ppsv23 Depression score 4  Has 2 old dogs- require a lot of care -has to be home for them after work She will be more free when they are gone   Mammogram 5/18 neg  Self breast exam -neg Sister has breast cancer   Colonoscopy 12/10 nl 10 y recall   zostavax 12/13  dexa 5/18- BMD in the normal range  D level of 63 No fractures   Hyperlipidemia Lab Results  Component Value Date   CHOL 158 11/14/2017   CHOL 183 11/13/2016   CHOL 174 11/06/2015   Lab Results  Component Value Date   HDL 61.50 11/14/2017   HDL 57.60 11/13/2016   HDL 57.80 11/06/2015   Lab Results  Component Value Date   LDLCALC 86 11/14/2017   LDLCALC 103 (H) 11/13/2016   LDLCALC 97 11/06/2015   Lab Results  Component Value Date   TRIG 52.0 11/14/2017   TRIG 114.0 11/13/2016   TRIG 95.0 11/06/2015   Lab Results  Component Value Date   CHOLHDL 3 11/14/2017   CHOLHDL 3 11/13/2016   CHOLHDL 3 11/06/2015   No results found for: LDLDIRECT On zocor and healthy diet  LDL is lower this year No fast food / fixes healthy meals  Avoids red meat    bp is stable today  No cp or palpitations or headaches or edema  No side  effects to medicines  BP Readings from Last 3 Encounters:  12/08/17 132/68  11/14/17 126/80  07/14/17 122/78   lisinopril hct    Hyperglycemia  Lab Results  Component Value Date   HGBA1C 5.5 11/14/2017   last time 5.4   Lab Results  Component Value Date   CREATININE 0.74 11/14/2017   BUN 16 11/14/2017   NA 135 11/14/2017   K 4.1 11/14/2017   CL 97 11/14/2017   CO2 26 11/14/2017   Lab Results  Component Value Date   ALT 20 11/14/2017   AST 25 11/14/2017   ALKPHOS 61 11/14/2017   BILITOT 0.6 11/14/2017   Lab Results  Component Value Date   WBC 8.1 11/14/2017   HGB 14.5 11/14/2017   HCT 41.8 11/14/2017   MCV 89.7 11/14/2017   PLT 318.0 11/14/2017   Lab Results  Component Value Date   TSH 2.46 11/14/2017     Patient Active Problem List   Diagnosis Date Noted  . Breast cancer screening by mammogram 12/08/2017  . Tonsil asymmetry 04/30/2017  . Estrogen deficiency 12/04/2016  .  Hematuria 10/11/2016  . Infiltrate of lung present on imaging of chest 08/09/2016  . Grief reaction 09/09/2014  . Encounter for Medicare annual wellness exam 09/06/2013  . 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 Hyperglycemia 08/12/2012  . Other screening mammogram 03/22/2011  . Hyperlipidemia 01/01/2011  . Hypertension 01/01/2011  . GERD (gastroesophageal reflux disease) 01/01/2011  . Routine general medical examination at a health care facility 01/01/2011  . Vitamin D deficiency disease 01/01/2011  . Osteopenia 01/01/2011  . Sleep apnea 01/01/2011  . Anxiety 01/01/2011  . Allergic rhinitis 01/01/2011  . Rosacea 01/01/2011   Past Medical History:  Diagnosis Date  . Allergy   . Bronchitis   . Colon polyps   . Depression   . GERD (gastroesophageal reflux disease)   . Heart murmur   . History of chicken pox   . Hx: UTI (urinary tract infection)   . Hyperlipidemia   . Hypertension   . Migraines   . Rosacea   . Sleep apnea    uses cpap every night    History reviewed. No  pertinent surgical history. Social History   Tobacco Use  . Smoking status: Never Smoker  . Smokeless tobacco: Never Used  Substance Use Topics  . Alcohol use: Yes    Alcohol/week: 0.0 oz    Comment: wine occasional  . Drug use: No   Family History  Problem Relation Age of Onset  . Heart disease Father   . Hypertension Father   . Diabetes Other   . Heart disease Mother   . Stroke Paternal Grandmother   . Heart disease Paternal Grandfather   . Hypertension Paternal Grandfather   . Breast cancer Paternal Aunt    Allergies  Allergen Reactions  . Shellfish Allergy Anaphylaxis    Sob/ swelling  Sob/ swelling    Current Outpatient Medications on File Prior to Visit  Medication Sig Dispense Refill  . Ascorbic Acid (VITAMIN C) 100 MG CHEW Chew 1 tablet by mouth daily.    Marland Kitchen aspirin 81 MG tablet Take 81 mg by mouth daily.      . Cholecalciferol (VITAMIN D3) 1000 UNITS CAPS Take 5,000 capsules by mouth daily.     Marland Kitchen EPINEPHrine (EPI-PEN) 0.3 mg/0.3 mL SOAJ injection Inject 0.3 mLs (0.3 mg total) into the muscle once. For allergic reaction 1 Device 3  . loratadine (CLARITIN) 10 MG tablet Take 10 mg by mouth daily.    . Naproxen Sodium (ALEVE PO) Take 1 tablet by mouth as needed.    . [DISCONTINUED] atorvastatin (LIPITOR) 10 MG tablet Take 1 tablet (10 mg total) by mouth daily. 30 tablet 11   No current facility-administered medications on file prior to visit.     Review of Systems  Constitutional: Positive for fatigue. Negative for activity change, appetite change, fever and unexpected weight change.  HENT: Negative for congestion, ear pain, rhinorrhea, sinus pressure and sore throat.   Eyes: Negative for pain, redness and visual disturbance.  Respiratory: Negative for cough, shortness of breath and wheezing.   Cardiovascular: Negative for chest pain and palpitations.  Gastrointestinal: Negative for abdominal pain, blood in stool, constipation and diarrhea.  Endocrine: Negative for  polydipsia and polyuria.  Genitourinary: Negative for dysuria, frequency and urgency.  Musculoskeletal: Negative for arthralgias, back pain and myalgias.  Skin: Negative for pallor and rash.  Allergic/Immunologic: Negative for environmental allergies.  Neurological: Negative for dizziness, syncope and headaches.  Hematological: Negative for adenopathy. Does not bruise/bleed easily.  Psychiatric/Behavioral: Positive for sleep disturbance. Negative  for decreased concentration and dysphoric mood. The patient is nervous/anxious.        Objective:   Physical Exam  Constitutional: She appears well-developed and well-nourished. No distress.  obese and well appearing   HENT:  Head: Normocephalic and atraumatic.  Right Ear: External ear normal.  Left Ear: External ear normal.  Mouth/Throat: Oropharynx is clear and moist.  Nares are injected and congested  No sinus tenderness Clear pnd  Eyes: Pupils are equal, round, and reactive to light. Conjunctivae and EOM are normal. No scleral icterus.  Neck: Normal range of motion. Neck supple. No JVD present. Carotid bruit is not present. No thyromegaly present.  Cardiovascular: Normal rate, regular rhythm, normal heart sounds and intact distal pulses. Exam reveals no gallop.  Pulmonary/Chest: Effort normal and breath sounds normal. No respiratory distress. She has no wheezes. She exhibits no tenderness. No breast swelling, tenderness, discharge or bleeding.  Abdominal: Soft. Bowel sounds are normal. She exhibits no distension, no abdominal bruit and no mass. There is no tenderness.  Genitourinary: No breast swelling, tenderness, discharge or bleeding.  Genitourinary Comments: Breast exam: No mass, nodules, thickening, tenderness, bulging, retraction, inflamation, nipple discharge or skin changes noted.  No axillary or clavicular LA.      Musculoskeletal: Normal range of motion. She exhibits no edema or tenderness.  No kyphosis   Lymphadenopathy:     She has no cervical adenopathy.  Neurological: She is alert. She has normal reflexes. No cranial nerve deficit. She exhibits normal muscle tone. Coordination normal.  Skin: Skin is warm and dry. No rash noted. No erythema. No pallor.  Solar lentigines diffusely   rosacea  Psychiatric: She has a normal mood and affect.  Pleasant           Assessment & Plan:   Problem List Items Addressed This Visit      Cardiovascular and Mediastinum   Hypertension    bp in fair control at this time  BP Readings from Last 1 Encounters:  12/08/17 132/68   No changes needed Disc lifstyle change with low sodium diet and exercise  Labs reviewed  Wt loss enc       Relevant Medications   lisinopril-hydrochlorothiazide (PRINZIDE,ZESTORETIC) 20-25 MG tablet   simvastatin (ZOCOR) 20 MG tablet     Respiratory   Allergic rhinitis    Continue claritin and flonase Enc trial of saline nasal rinse Disc pollen avoidance in peak season        Musculoskeletal and Integument   Osteopenia    Last dexa 5/18 nl BMD Disc need for calcium/ vitamin D/ wt bearing exercise and bone density test every 2 y to monitor Disc safety/ fracture risk in detail          Other   Anxiety    Disc use of meditation for sleep and relaxation  Reviewed stressors/ coping techniques/symptoms/ support sources/ tx options and side effects in detail today Offered counseling at any time       Breast cancer screening by mammogram    Scheduled annual screening mammogram Nl breast exam today  Encouraged monthly self exams        Relevant Orders   MM DIGITAL SCREENING BILATERAL   Hyperglycemia    Lab Results  Component Value Date   HGBA1C 5.5 11/14/2017   Continues to be well controlled disc imp of low glycemic diet and wt loss to prevent DM2       Hyperlipidemia    Disc goals for lipids and reasons to  control them Rev labs with pt Rev low sat fat diet in detail  Continue statin and diet       Relevant  Medications   lisinopril-hydrochlorothiazide (PRINZIDE,ZESTORETIC) 20-25 MG tablet   simvastatin (ZOCOR) 20 MG tablet   Routine general medical examination at a health care facility - Primary    Reviewed health habits including diet and exercise and skin cancer prevention Reviewed appropriate screening tests for age  Also reviewed health mt list, fam hx and immunization status , as well as social and family history   See HPI Labs rev  Urged to start exercise routine  Mammogram due in May      Vitamin D deficiency disease    Vitamin D level is therapeutic with current supplementation Disc importance of this to bone and overall health Level of 63

## 2017-12-08 NOTE — Assessment & Plan Note (Signed)
Last dexa 5/18 nl BMD Disc need for calcium/ vitamin D/ wt bearing exercise and bone density test every 2 y to monitor Disc safety/ fracture risk in detail

## 2017-12-08 NOTE — Assessment & Plan Note (Signed)
Disc use of meditation for sleep and relaxation  Reviewed stressors/ coping techniques/symptoms/ support sources/ tx options and side effects in detail today Offered counseling at any time

## 2017-12-08 NOTE — Assessment & Plan Note (Signed)
Continue claritin and flonase Enc trial of saline nasal rinse Disc pollen avoidance in peak season

## 2017-12-08 NOTE — Assessment & Plan Note (Signed)
bp in fair control at this time  BP Readings from Last 1 Encounters:  12/08/17 132/68   No changes needed Disc lifstyle change with low sodium diet and exercise  Labs reviewed  Wt loss enc

## 2017-12-08 NOTE — Assessment & Plan Note (Signed)
Reviewed health habits including diet and exercise and skin cancer prevention Reviewed appropriate screening tests for age  Also reviewed health mt list, fam hx and immunization status , as well as social and family history   See HPI Labs rev  Urged to start exercise routine  Mammogram due in May

## 2017-12-08 NOTE — Assessment & Plan Note (Signed)
Vitamin D level is therapeutic with current supplementation Disc importance of this to bone and overall health Level of 63  

## 2017-12-08 NOTE — Assessment & Plan Note (Signed)
Lab Results  Component Value Date   HGBA1C 5.5 11/14/2017   Continues to be well controlled disc imp of low glycemic diet and wt loss to prevent DM2

## 2017-12-08 NOTE — Assessment & Plan Note (Signed)
Disc goals for lipids and reasons to control them Rev labs with pt Rev low sat fat diet in detail Continue statin and diet  

## 2017-12-08 NOTE — Patient Instructions (Addendum)
Check out the app called smiling mind  Try to use it to go back to sleep  Also for general relaxation   Try to fit in 30 minutes of aerobic exercise per day-indoors or outdoors  Think about gym/video/ yoga mat/ exercise equipment for home   You will be due for a mammogram in may   Continue claritin and flonase  Try some nasal sinus rinses

## 2017-12-08 NOTE — Assessment & Plan Note (Signed)
Scheduled annual screening mammogram Nl breast exam today  Encouraged monthly self exams   

## 2017-12-09 ENCOUNTER — Telehealth: Payer: Self-pay | Admitting: Family Medicine

## 2017-12-09 MED ORDER — OMEPRAZOLE 40 MG PO CPDR: 40.0000 mg | DELAYED_RELEASE_CAPSULE | Freq: Every day | ORAL | 3 refills | Status: DC

## 2017-12-09 NOTE — Telephone Encounter (Signed)
Spoke to insurance and they only cover once daily dosing but the do cover nexium 40 mg once daily. Spoke with pt and she is okay to change Rx to 40 mg once daily. I checked with Dr. Milinda Antisower and she was okay with change too. New Rx sent to pharmacy

## 2017-12-09 NOTE — Telephone Encounter (Signed)
Copied from CRM [id number]. Topic: Quick Communication - See Telephone Encounter >> Dec 09, 2017  8:35 AM Waymon AmatoBurton, Donna F wrote: CRM for notification. See Telephone encounter for: 12/09/17.pt is checking on status of the prior authorizatioh prilosec -she waned the office to know that she is completely out and Is having a hard time understanding Why she is needing to get an authorization when she has been taking this forever  Best number [phone]

## 2018-01-09 ENCOUNTER — Ambulatory Visit
Admission: RE | Admit: 2018-01-09 | Discharge: 2018-01-09 | Disposition: A | Discharge status: Home / Self Care | Payer: Medicare HMO | Source: Ambulatory Visit | Attending: Family Medicine | Admitting: Family Medicine

## 2018-01-09 DIAGNOSIS — Z1231 Encounter for screening mammogram for malignant neoplasm of breast: Secondary | ICD-10-CM | POA: Insufficient documentation

## 2018-02-05 DIAGNOSIS — R69 Illness, unspecified: Secondary | ICD-10-CM | POA: Diagnosis not present

## 2018-03-25 ENCOUNTER — Telehealth: Payer: Self-pay

## 2018-03-25 NOTE — Telephone Encounter (Signed)
Unable to reach pt by phone to get more specific info on what type urinary problems pt is having.

## 2018-03-25 NOTE — Telephone Encounter (Signed)
Copied from CRM [phone]#[id number]. Topic: Referral - Request >> Mar 25, 2018  1:40 PM Alexander BergeronBarksdale, Harvey B wrote: Reason for CRM: pt called to have a referral set up w/ a physician that can help her w/ her urinary issues; contact to advise

## 2018-03-25 NOTE — Telephone Encounter (Signed)
Called pt to advise she needs an OV to discuss this and to get a urinalysis before we can refer her but no answer so left VM requesting pt to call the office back and make an appt to discuss this and to also get a urine sample at OV

## 2018-03-27 ENCOUNTER — Ambulatory Visit (INDEPENDENT_AMBULATORY_CARE_PROVIDER_SITE_OTHER): Payer: Medicare HMO | Admitting: Family Medicine

## 2018-03-27 ENCOUNTER — Encounter: Payer: Self-pay | Admitting: Family Medicine

## 2018-03-27 VITALS — BP 132/82 | HR 70 | Temp 98.1°F | Ht 62.25 in | Wt 158.5 lb

## 2018-03-27 DIAGNOSIS — R300 Dysuria: Secondary | ICD-10-CM | POA: Diagnosis not present

## 2018-03-27 LAB — POC URINALSYSI DIPSTICK (AUTOMATED)
Bilirubin, UA: NEGATIVE
Blood, UA: NEGATIVE
Glucose, UA: NEGATIVE
Ketones, UA: NEGATIVE
Leukocytes, UA: NEGATIVE
Nitrite, UA: NEGATIVE
Protein, UA: NEGATIVE
Spec Grav, UA: 1.015 (ref 1.010–1.025)
Urobilinogen, UA: 0.2 E.U./dL
pH, UA: 7 (ref 5.0–8.0)

## 2018-03-27 NOTE — Patient Instructions (Signed)
Continue avoiding excess caffeine/acid and spicy food  Drink lots of water  Avoid using soap on genital area- rinse with water only   If you feel sandy/sedement in urine- use the screen to see more closely and if you pass a stone please let us know   We will refer you to urology

## 2018-03-27 NOTE — Assessment & Plan Note (Signed)
Neg UA Recurrent  ? If due to urethritis Enc water intake and avoid caffeine/acid and spicy foods and art sweeteners Do not use soap in perineal area Showers instead of baths  Ref to urology for further eval

## 2018-03-27 NOTE — Progress Notes (Signed)
Subjective:    Patient ID: Adriana Marks, female    DOB: Jun 26, 1946, 72 y.o.   MRN: [id number]  HPI Here for urinary symptoms   Wt Readings from Last 3 Encounters:  03/27/18 158 lb 8 oz (71.9 kg)  12/08/17 161 lb 8 oz (73.3 kg)  11/14/17 156 lb 8 oz (71 kg)   28.76 kg/m   She has had multiple episodes of symptoms w/o infection  It is frustrating   No hx of kidney stone  She has seen some sediment and white looking tissue in urine  No severe back pain   Bladder discomfort and pressure  Has used a heating pad  It burns to urinate (and after)- at time (not every time) Has not seen blood in urine  Some frequency when these spells come on   Today her symptoms are improved   Has been drinking 2 glasses of water in am  One small cup of coffee No soft drinks  No tea  She also cut down on spicy foods  She cut down on tub baths  Does not swim   She wipes front to back   She does use soap     Results for orders placed or performed in visit on 03/27/18  POCT Urinalysis Dipstick (Automated)  Result Value Ref Range   Color, UA Light Yellow    Clarity, UA clear    Glucose, UA Negative Negative   Bilirubin, UA Negative    Ketones, UA Negative    Spec Grav, UA 1.015 1.010 - 1.025   Blood, UA Negative    pH, UA 7.0 5.0 - 8.0   Protein, UA Negative Negative   Urobilinogen, UA 0.2 0.2 or 1.0 E.U./dL   Nitrite, UA Negative    Leukocytes, UA Negative Negative   UA is clear    Patient Active Problem List   Diagnosis Date Noted  . Dysuria 03/27/2018  . Breast cancer screening by mammogram 12/08/2017  . Tonsil asymmetry 04/30/2017  . Estrogen deficiency 12/04/2016  . Hematuria 10/11/2016  . Infiltrate of lung present on imaging of chest 08/09/2016  . Grief reaction 09/09/2014  . Encounter for Medicare annual wellness exam 09/06/2013  . 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 Hyperglycemia 08/12/2012  . Other screening mammogram 03/22/2011  . Hyperlipidemia 01/01/2011  .  Hypertension 01/01/2011  . GERD (gastroesophageal reflux disease) 01/01/2011  . Routine general medical examination at a health care facility 01/01/2011  . Vitamin D deficiency disease 01/01/2011  . Osteopenia 01/01/2011  . Sleep apnea 01/01/2011  . Anxiety 01/01/2011  . Allergic rhinitis 01/01/2011  . Rosacea 01/01/2011   Past Medical History:  Diagnosis Date  . Allergy   . Bronchitis   . Colon polyps   . Depression   . GERD (gastroesophageal reflux disease)   . Heart murmur   . History of chicken pox   . Hx: UTI (urinary tract infection)   . Hyperlipidemia   . Hypertension   . Migraines   . Rosacea   . Sleep apnea    uses cpap every night    History reviewed. No pertinent surgical history. Social History   Tobacco Use  . Smoking status: Never Smoker  . Smokeless tobacco: Never Used  Substance Use Topics  . Alcohol use: Yes    Alcohol/week: 0.0 oz    Comment: wine occasional  . Drug use: No   Family History  Problem Relation Age of Onset  . Heart disease Father   . Hypertension  Father   . Diabetes Other   . Heart disease Mother   . Stroke Paternal Grandmother   . Heart disease Paternal Grandfather   . Hypertension Paternal Grandfather   . Breast cancer Paternal Aunt   . Breast cancer Sister    Allergies  Allergen Reactions  . Shellfish Allergy Anaphylaxis    Sob/ swelling  Sob/ swelling    Current Outpatient Medications on File Prior to Visit  Medication Sig Dispense Refill  . Ascorbic Acid (VITAMIN C) 100 MG CHEW Chew 1 tablet by mouth daily.    Marland Kitchen. aspirin 81 MG tablet Take 81 mg by mouth daily.      . Cholecalciferol (VITAMIN D3) 1000 UNITS CAPS Take 5,000 capsules by mouth daily.     Marland Kitchen. EPINEPHrine (EPI-PEN) 0.3 mg/0.3 mL SOAJ injection Inject 0.3 mLs (0.3 mg total) into the muscle once. For allergic reaction 1 Device 3  . lisinopril-hydrochlorothiazide (PRINZIDE,ZESTORETIC) 20-25 MG tablet Take 1 tablet by mouth daily. 90 tablet 3  . loratadine  (CLARITIN) 10 MG tablet Take 10 mg by mouth daily.    . Naproxen Sodium (ALEVE PO) Take 1 tablet by mouth as needed.    Marland Kitchen. omeprazole (PRILOSEC) 40 MG capsule Take 1 capsule (40 mg total) by mouth daily. 90 capsule 3  . simvastatin (ZOCOR) 20 MG tablet Take 1 tablet (20 mg total) by mouth at bedtime. 90 tablet 3  . [DISCONTINUED] atorvastatin (LIPITOR) 10 MG tablet Take 1 tablet (10 mg total) by mouth daily. 30 tablet 11   No current facility-administered medications on file prior to visit.     Review of Systems  Constitutional: Negative for activity change, appetite change, fatigue, fever and unexpected weight change.  HENT: Negative for congestion, ear pain, rhinorrhea, sinus pressure and sore throat.   Eyes: Negative for pain, redness and visual disturbance.  Respiratory: Negative for cough, shortness of breath and wheezing.   Cardiovascular: Negative for chest pain and palpitations.  Gastrointestinal: Negative for abdominal pain, blood in stool, constipation and diarrhea.  Endocrine: Negative for polydipsia and polyuria.  Genitourinary: Positive for dysuria and frequency. Negative for decreased urine volume, difficulty urinating, flank pain, hematuria, pelvic pain, urgency, vaginal bleeding and vaginal discharge.  Musculoskeletal: Negative for arthralgias, back pain and myalgias.  Skin: Negative for pallor and rash.  Allergic/Immunologic: Negative for environmental allergies.  Neurological: Negative for dizziness, syncope and headaches.  Hematological: Negative for adenopathy. Does not bruise/bleed easily.  Psychiatric/Behavioral: Negative for decreased concentration and dysphoric mood. The patient is not nervous/anxious.        Objective:   Physical Exam  Constitutional: She appears well-developed and well-nourished. No distress.  Well appearing and overwt  HENT:  Head: Normocephalic and atraumatic.  Eyes: Pupils are equal, round, and reactive to light. Conjunctivae and EOM are  normal.  Neck: Normal range of motion. Neck supple.  Cardiovascular: Normal rate, regular rhythm and normal heart sounds.  Pulmonary/Chest: Effort normal and breath sounds normal.  Abdominal: Soft. Bowel sounds are normal. She exhibits no distension. There is tenderness. There is no rebound.  No cva tenderness  no suprapubic tenderness or fullness  Musculoskeletal: She exhibits no edema.  Lymphadenopathy:    She has no cervical adenopathy.  Neurological: She is alert. She displays normal reflexes. Coordination normal.  Skin: No rash noted. No erythema.  Psychiatric: She has a normal mood and affect.          Assessment & Plan:   Problem List Items Addressed This Visit  Other   Dysuria - Primary    Neg UA Recurrent  ? If due to urethritis Enc water intake and avoid caffeine/acid and spicy foods and art sweeteners Do not use soap in perineal area Showers instead of baths  Ref to urology for further eval       Relevant Orders   POCT Urinalysis Dipstick (Automated) (Completed)   Ambulatory referral to Urology

## 2018-04-07 ENCOUNTER — Encounter: Payer: Self-pay | Admitting: Urology

## 2018-04-07 ENCOUNTER — Ambulatory Visit: Payer: Medicare HMO | Admitting: Urology

## 2018-04-07 VITALS — BP 162/93 | HR 85 | Ht 62.25 in | Wt 159.7 lb

## 2018-04-07 DIAGNOSIS — N3281 Overactive bladder: Secondary | ICD-10-CM | POA: Diagnosis not present

## 2018-04-07 DIAGNOSIS — R300 Dysuria: Secondary | ICD-10-CM | POA: Diagnosis not present

## 2018-04-07 LAB — MICROSCOPIC EXAMINATION
Bacteria, UA: NONE SEEN
Epithelial Cells (non renal): NONE SEEN /hpf (ref 0–10)
WBC, UA: NONE SEEN /hpf (ref 0–5)

## 2018-04-07 LAB — URINALYSIS, COMPLETE
Bilirubin, UA: NEGATIVE
Glucose, UA: NEGATIVE
Ketones, UA: NEGATIVE
Leukocytes, UA: NEGATIVE
Nitrite, UA: NEGATIVE
Protein, UA: NEGATIVE
Specific Gravity, UA: 1.015 (ref 1.005–1.030)
Urobilinogen, Ur: 0.2 mg/dL (ref 0.2–1.0)
pH, UA: 7 (ref 5.0–7.5)

## 2018-04-07 MED ORDER — TOLTERODINE TARTRATE ER 4 MG PO CP24: 4.0000 mg | ORAL_CAPSULE | Freq: Every day | ORAL | 11 refills | Status: DC

## 2018-04-07 NOTE — Progress Notes (Signed)
04/07/2018 8:38 AM   Adriana Marks [birth year] [id number]  Referring provider: Judy Pimpleower, Marne A, MD [address] [place], [place] [address]  CC: Urinary frequency  HPI: I am seeing Adriana Marks in urology clinic today in consultation from Dr. Lucretia Roersowers for urinary frequency.  She is a healthy 72 year old female who endorses a multiyear history of ongoing urinary frequency with voids every 45 minutes to hour and a half.  The severity is mild.  Will occasionally have a few episodes of dysuria per year as well, however she has never had a positive urine culture.  Reports nocturia 2-3 times per night.  In reviewing her prior urinalyses she has never had microscopic hematuria.  She specifically denies frequency, urinary incontinence, or gross hematuria.  Denies history of kidney stones. In terms of fluid intake, she has a cup of coffee in the morning and multiple glasses of water, however she minimizes her fluid intake at night to reduce her nocturia. She is a non-smoker.   PMH: Past Medical History:  Diagnosis Date  . Allergy   . Bronchitis   . Colon polyps   . Depression   . GERD (gastroesophageal reflux disease)   . Heart murmur   . History of chicken pox   . Hx: UTI (urinary tract infection)   . Hyperlipidemia   . Hypertension   . Migraines   . Rosacea   . Sleep apnea    uses cpap every night     Surgical History: Foot surgery 2004  Allergies:  Allergies  Allergen Reactions  . Shellfish Allergy Anaphylaxis    Sob/ swelling  Sob/ swelling     Family History: Family History  Problem Relation Age of Onset  . Heart disease Father   . Hypertension Father   . Diabetes Other   . Heart disease Mother   . Stroke Paternal Grandmother   . Heart disease Paternal Grandfather   . Hypertension Paternal Grandfather   . Breast cancer Paternal Aunt   . Breast cancer Sister     Social History:  reports that she has never smoked. She has never used smokeless tobacco. She  reports that she drinks alcohol. She reports that she does not use drugs.  ROS: Please see flowsheet from today's date for complete review of systems.  Physical Exam: LMP 08/26/1997   Constitutional:  Alert and oriented, No acute distress. Cardiovascular: No clubbing, cyanosis, or edema. Respiratory: Normal respiratory effort, no increased work of breathing. GI: Abdomen is soft, nontender, nondistended, no abdominal masses GU: No CVA tenderness Lymph: No cervical or inguinal lymphadenopathy. Skin: No rashes, bruises or suspicious lesions. Neurologic: Grossly intact, no focal deficits, moving all 4 extremities. Psychiatric: Normal mood and affect.  Laboratory Data: Lab Results  Component Value Date   WBC 8.1 11/14/2017   HGB 14.5 11/14/2017   HCT 41.8 11/14/2017   MCV 89.7 11/14/2017   PLT 318.0 11/14/2017    Lab Results  Component Value Date   CREATININE 0.74 11/14/2017    Urinalysis reviewed today, benign with no RBCs WBCs, or signs of infection  Lab Results  Component Value Date   HGBA1C 5.5 11/14/2017    Pertinent Imaging: None to review  Assessment & Plan:   In summary Ms. Adriana Marks is a 72 year old female with a long history of urinary frequency.  She has nothing in her history that is worrisome for bladder cancer or CIS, including minimal smoking history and no history of microscopic hematuria. Additionally, her primary complaint is  frequency.  We discussed at length the etiologies of overactive bladder including management strategies with behavioral therapy as well as trial of an anticholinergic.  I discussed side effects at length including constipation and dry mouth, and the importance of an adequate trial of at least 3 to 4 weeks with this medication.  We discussed that there are other oral options if she has poor symptom improvement with Detrol.  1.OAB -Trial of Detrol XL 4mg  daily -She prefers follow up PRN, will call if    Sondra ComeBrian C Angellica Maddison,  MD  [facility] [address], [address] [place], [place] [address] [phone]

## 2018-04-14 ENCOUNTER — Telehealth: Payer: Self-pay

## 2018-04-14 NOTE — Telephone Encounter (Signed)
PA for [employer] submitted via covermymeds

## 2018-04-15 NOTE — Telephone Encounter (Signed)
PA for Tolterodine (Detrol [place]) approved 08/24/17-08/25/2018

## 2018-05-06 DIAGNOSIS — R69 Illness, unspecified: Secondary | ICD-10-CM | POA: Diagnosis not present

## 2018-07-29 ENCOUNTER — Telehealth: Payer: Self-pay | Admitting: Family Medicine

## 2018-07-29 NOTE — Telephone Encounter (Signed)
error 

## 2018-10-06 DIAGNOSIS — R69 Illness, unspecified: Secondary | ICD-10-CM | POA: Diagnosis not present

## 2018-11-12 ENCOUNTER — Telehealth: Payer: Self-pay

## 2018-11-12 NOTE — Telephone Encounter (Signed)
Pt has been working outside recently. Starting to have a cough at times and a burning in her chest, sneezing. She works in a daycare, also, and they are cleaning with more chemicals than usual. Temp 99.1 taking Claritin and Flonase. Asking if she needs an OV. If so, should she be in the well visits in the am or sick visits in pm. She does not think she needs to be in the pm if possible. Please call [phone].

## 2018-11-12 NOTE — Telephone Encounter (Signed)
I think that home symptomatic care is ok.  If she does need to come in - it needs to be PM since she has cough and elevated temp

## 2018-11-12 NOTE — Telephone Encounter (Signed)
Pt notified of Dr. Royden Purl comments. 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

## 2018-12-02 ENCOUNTER — Ambulatory Visit: Payer: Medicare HMO

## 2018-12-11 ENCOUNTER — Encounter: Payer: Medicare HMO | Admitting: Family Medicine

## 2018-12-13 ENCOUNTER — Other Ambulatory Visit: Payer: Self-pay | Admitting: Family Medicine

## 2019-01-06 ENCOUNTER — Encounter: Payer: Self-pay | Admitting: Family Medicine

## 2019-01-08 ENCOUNTER — Encounter: Payer: Self-pay | Admitting: Family Medicine

## 2019-01-21 ENCOUNTER — Other Ambulatory Visit: Payer: Self-pay | Admitting: Family Medicine

## 2019-01-28 ENCOUNTER — Telehealth: Payer: Self-pay | Admitting: Family Medicine

## 2019-01-28 NOTE — Telephone Encounter (Signed)
Left VM letting pt know we are not seeing sick patients in the office, I advised her that we can do a phone call visit or she can go to an UC either one is fine. I advised pt on her VM if she would like to do a phone call visit to call us back and schedule an appt with Dr. Milinda Antis

## 2019-01-28 NOTE — Telephone Encounter (Signed)
Patient said she's had head congestion,drainage in throat, mucous in her nose, no fever x 1 month.  Patient feels she has a sinus infection.  Patient doesn't have video capability and she'd like to come in to the office.  Patient doesn't feel comfortable going to an urgent care, but she'll go if Dr.[name] recommends she go to urgent care.  Patient can be notified on my chart or call before 2:00 tomorrow.

## 2019-01-29 ENCOUNTER — Encounter: Payer: Self-pay | Admitting: Family Medicine

## 2019-01-29 ENCOUNTER — Telehealth: Payer: Self-pay | Admitting: *Deleted

## 2019-01-29 ENCOUNTER — Ambulatory Visit (INDEPENDENT_AMBULATORY_CARE_PROVIDER_SITE_OTHER): Payer: Medicare HMO | Admitting: Family Medicine

## 2019-01-29 DIAGNOSIS — I10 Essential (primary) hypertension: Secondary | ICD-10-CM | POA: Diagnosis not present

## 2019-01-29 DIAGNOSIS — J0110 Acute frontal sinusitis, unspecified: Secondary | ICD-10-CM | POA: Diagnosis not present

## 2019-01-29 DIAGNOSIS — J309 Allergic rhinitis, unspecified: Secondary | ICD-10-CM

## 2019-01-29 DIAGNOSIS — J019 Acute sinusitis, unspecified: Secondary | ICD-10-CM | POA: Insufficient documentation

## 2019-01-29 MED ORDER — AMOXICILLIN-POT CLAVULANATE 875-125 MG PO TABS: 1.0000 | ORAL_TABLET | Freq: Two times a day (BID) | ORAL | 0 refills | Status: DC

## 2019-01-29 MED ORDER — OMEPRAZOLE 40 MG PO CPDR: 40.0000 mg | DELAYED_RELEASE_CAPSULE | Freq: Every day | ORAL | 1 refills | Status: DC

## 2019-01-29 NOTE — Assessment & Plan Note (Signed)
bp was good here but at last urology appt it was high Will watch Pt has f/u in aug

## 2019-01-29 NOTE — Assessment & Plan Note (Signed)
With allergies Over a month of congestion with facial pressure and discomfort  Now nasal drainage has color to it  Will cover with augmentin  Continue flonase  Disc saline irrigation/ steam/warm compresses   Watch closely for cough/fever or other symptoms of covid She will continue to check temp daily as well Update if not starting to improve in a week or if worsening

## 2019-01-29 NOTE — Assessment & Plan Note (Signed)
Worse than usual this season Will inc flonase to bid for 2 weeks Consider changing claritin to another otc non sedating antihistamine like allegra or zyrtec Allergen avoidance Call if this does not help

## 2019-01-29 NOTE — Patient Instructions (Signed)
I think you have a sinus infection  Take the augmentin as directed  Drink fluids Saline sinus irrigation and steam may help congestion  Continue flonase twice daily for the next few weeks Continue claritin or other antihistamine if helpful for allergies  Watch for worse sinus pain/fever/cough or other symptoms and let us know  Keep watching your temp closely in light of the covid-19 epidemic

## 2019-01-29 NOTE — Progress Notes (Signed)
Virtual Visit via Telephone Note  I connected with Adriana Marks on 01/29/19 at 11:00 AM EDT by telephone and verified that I am speaking with the correct person using two identifiers.  Location: Patient: home Provider: office  Phone visit    I discussed the limitations, risks, security and privacy concerns of performing an evaluation and management service by telephone and the availability of in person appointments. I also discussed with the patient that there may be a patient responsible charge related to this service. The patient expressed understanding and agreed to proceed.   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  HENT: Positive for congestion and sinus pain. Negative for ear pain, hearing loss and sore throat.   Eyes: Negative for blurred vision, discharge and redness.  Respiratory: Positive for cough. Negative for sputum production, shortness of breath, wheezing and stridor.        Slight cough occ from pnd   Cardiovascular: Negative for chest pain and  palpitations.  Gastrointestinal: Negative for abdominal pain, diarrhea, nausea and vomiting.  Musculoskeletal: Negative for myalgias.  Skin: Negative for rash.  Neurological: Negative for dizziness and headaches.      Patient Active Problem List   Diagnosis Date Noted  . Acute sinusitis 01/29/2019  . Dysuria 03/27/2018  . Breast cancer screening by mammogram 12/08/2017  . Tonsil asymmetry 04/30/2017  . Estrogen deficiency 12/04/2016  . Hematuria 10/11/2016  . Infiltrate of lung present on imaging of chest 08/09/2016  . Grief reaction 09/09/2014  . Encounter for Medicare annual wellness exam 09/06/2013  . 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 Hyperglycemia 08/12/2012  . Other screening mammogram 03/22/2011  . Hyperlipidemia 01/01/2011  . Hypertension 01/01/2011  . GERD (gastroesophageal reflux disease) 01/01/2011  . Routine general medical examination at a health care facility 01/01/2011  . Vitamin D deficiency disease 01/01/2011  . Osteopenia 01/01/2011  . Sleep apnea 01/01/2011  . Anxiety 01/01/2011  . Allergic rhinitis 01/01/2011  . Rosacea 01/01/2011   Past Medical History:  Diagnosis Date  . Allergy   . Bronchitis   . Colon polyps   . Depression   . GERD (gastroesophageal reflux disease)   . Heart murmur   . History of chicken pox   . Hx: UTI (urinary tract infection)   . Hyperlipidemia   . Hypertension   . Migraines   . Rosacea   . Sleep apnea  uses cpap every night    Past Surgical History:  Procedure Laterality Date  . DNC    . FOOT SURGERY     Social History   Tobacco Use  . Smoking status: Former Games developermoker  . Smokeless tobacco: Never Used  Substance Use Topics  . Alcohol use: Yes    Alcohol/week: 0.0 standard drinks    Comment: wine occasional  . Drug use: No   Family History  Problem Relation Age of Onset  . Heart disease Father   . Hypertension Father   . Diabetes Other   . Heart disease Mother   . Stroke Paternal Grandmother   . Heart  disease Paternal Grandfather   . Hypertension Paternal Grandfather   . Breast cancer Paternal Aunt   . Breast cancer Sister   . Bladder Cancer Neg Hx   . Kidney cancer Neg Hx    Allergies  Allergen Reactions  . Shellfish Allergy Anaphylaxis    Sob/ swelling  Sob/ swelling    Current Outpatient Medications on File Prior to Visit  Medication Sig Dispense Refill  . Ascorbic Acid (VITAMIN C) 100 MG CHEW Chew 1 tablet by mouth daily.    Marland Kitchen. aspirin 81 MG tablet Take 81 mg by mouth daily.      . Cholecalciferol (VITAMIN D3) 1000 UNITS CAPS Take 5,000 capsules by mouth daily.     Marland Kitchen. EPINEPHrine (EPI-PEN) 0.3 mg/0.3 mL SOAJ injection Inject 0.3 mLs (0.3 mg total) into the muscle once. For allergic reaction 1 Device 3  . lisinopril-hydrochlorothiazide (ZESTORETIC) 20-25 MG tablet TAKE 1 TABLET BY MOUTH EVERY DAY 90 tablet 2  . loratadine (CLARITIN) 10 MG tablet Take 10 mg by mouth daily.    . Naproxen Sodium (ALEVE PO) Take 1 tablet by mouth as needed.    . simvastatin (ZOCOR) 20 MG tablet TAKE 1 TABLET BY MOUTH EVERYDAY AT BEDTIME 90 tablet 0  . tolterodine (DETROL LA) 4 MG 24 hr capsule Take 1 capsule (4 mg total) by mouth daily. 30 capsule 11  . [DISCONTINUED] atorvastatin (LIPITOR) 10 MG tablet Take 1 tablet (10 mg total) by mouth daily. 30 tablet 11   No current facility-administered medications on file prior to visit.     Observations/Objective: Pt sounds like her usual self  Not distressed  Not hoarse /voice is baseline  No sob or cough during interview  Affect is normal-pleasant and talkative   Assessment and Plan: Problem List Items Addressed This Visit      Cardiovascular and Mediastinum   Hypertension    bp was good here but at last urology appt it was high Will watch Pt has f/u in aug        Respiratory   Allergic rhinitis - Primary    Worse than usual this season Will inc flonase to bid for 2 weeks Consider changing claritin to another otc non sedating antihistamine  like allegra or zyrtec Allergen avoidance Call if this does not help      Acute sinusitis    With allergies Over a month of congestion with facial pressure and discomfort  Now nasal drainage has color to it  Will cover with augmentin  Continue flonase  Disc saline irrigation/ steam/warm compresses   Watch closely for cough/fever or other symptoms of covid She will continue to check temp daily as well Update if not starting to improve in a week or if worsening        Relevant Medications   amoxicillin-clavulanate (AUGMENTIN) 875-125 MG tablet  Follow Up Instructions: I think you have a sinus infection  Take the augmentin as directed  Drink fluids Saline sinus irrigation and steam may help congestion  Continue flonase twice daily for the next few weeks Continue claritin or other antihistamine if helpful for allergies  Watch for worse sinus pain/fever/cough or other symptoms and let us know  Keep watching your temp closely in light of the covid-19 epidemic    I discussed the assessment and treatment plan with the patient. The patient was provided an opportunity to ask questions and all were answered. The patient agreed with the plan and demonstrated an understanding of the instructions.   The patient was advised to call back or seek an in-person evaluation if the symptoms worsen or if the condition fails to improve as anticipated.  I provided 18 minutes of non-face-to-face time during this encounter.   Roxy Manns, MD

## 2019-02-02 ENCOUNTER — Other Ambulatory Visit: Payer: Self-pay | Admitting: Family Medicine

## 2019-02-02 ENCOUNTER — Other Ambulatory Visit: Payer: Self-pay

## 2019-02-02 MED ORDER — OMEPRAZOLE 20 MG PO CPDR: 20.0000 mg | DELAYED_RELEASE_CAPSULE | Freq: Two times a day (BID) | ORAL | 3 refills | Status: DC

## 2019-02-02 NOTE — Telephone Encounter (Signed)
Copied from CRM #[id number]. Topic: General - Other °>> Feb 02, 2019  8:03 AM Davis, Karen A wrote: °Reason for CRM: Patient called to say that Rx for omeprazole (PRILOSEC) 40 MG capsule is wrong and it need to be an Rx for 20 mg taken twice daily. Please send Rx to [id number] - [place], [place] - [address]         [phone] (Phone) °[phone] (Fax) °  °And contact patient please °

## 2019-02-02 NOTE — Telephone Encounter (Signed)
Please sent the omeprazole 20 mg bid #[phone] refills  Thanks

## 2019-02-02 NOTE — Telephone Encounter (Signed)
Pt states her RX for omeprazole should be for 20 mg not 40 mg, is this correct if so can you please resend this RX for the patient.  Adriana Marks,cma

## 2019-02-02 NOTE — Progress Notes (Signed)
New refill for 20 mg of omeprazole sent to pharmacy.  Kemp Gomes,cma

## 2019-02-02 NOTE — Telephone Encounter (Signed)
Copied from [place] [id number]. Topic: General - Other >> Feb 02, 2019  8:03 AM Leward Quan A wrote: Reason for CRM: Patient called to say that Rx for omeprazole (PRILOSEC) 40 MG capsule is wrong and it need to be an Rx for 20 mg taken twice daily. Please send Rx to [id number] - [place], [place] - [address]         [phone] (Phone) [phone] (Fax)  And contact patient please

## 2019-02-02 NOTE — Addendum Note (Signed)
Addended by: Loura Pardon A on: 02/02/2019 12:46 PM   Modules accepted: Orders

## 2019-02-09 ENCOUNTER — Other Ambulatory Visit: Payer: Self-pay | Admitting: Family Medicine

## 2019-02-09 DIAGNOSIS — Z1231 Encounter for screening mammogram for malignant neoplasm of breast: Secondary | ICD-10-CM

## 2019-03-19 ENCOUNTER — Ambulatory Visit
Admission: RE | Admit: 2019-03-19 | Discharge: 2019-03-19 | Disposition: A | Discharge status: Home / Self Care | Payer: Medicare HMO | Source: Ambulatory Visit | Attending: Family Medicine | Admitting: Family Medicine

## 2019-03-19 DIAGNOSIS — Z1231 Encounter for screening mammogram for malignant neoplasm of breast: Secondary | ICD-10-CM | POA: Diagnosis not present

## 2019-03-29 ENCOUNTER — Telehealth: Payer: Self-pay | Admitting: Family Medicine

## 2019-03-29 DIAGNOSIS — I10 Essential (primary) hypertension: Secondary | ICD-10-CM

## 2019-03-29 DIAGNOSIS — E7800 Pure hypercholesterolemia, unspecified: Secondary | ICD-10-CM

## 2019-03-29 DIAGNOSIS — R7303 Prediabetes: Secondary | ICD-10-CM

## 2019-03-29 DIAGNOSIS — E559 Vitamin D deficiency, unspecified: Secondary | ICD-10-CM

## 2019-03-29 NOTE — Telephone Encounter (Signed)
-----   Message from Ellamae Sia sent at 03/24/2019  2:42 PM EDT ----- Regarding: Lab orders for Tuesday, 8.4.20 Patient is scheduled for CPX labs, please order future labs, Thanks , Karna Christmas

## 2019-03-30 ENCOUNTER — Ambulatory Visit: Payer: Medicare HMO

## 2019-03-30 ENCOUNTER — Other Ambulatory Visit (INDEPENDENT_AMBULATORY_CARE_PROVIDER_SITE_OTHER): Payer: Medicare HMO

## 2019-03-30 DIAGNOSIS — I10 Essential (primary) hypertension: Secondary | ICD-10-CM | POA: Diagnosis not present

## 2019-03-30 DIAGNOSIS — E7800 Pure hypercholesterolemia, unspecified: Secondary | ICD-10-CM | POA: Diagnosis not present

## 2019-03-30 DIAGNOSIS — E559 Vitamin D deficiency, unspecified: Secondary | ICD-10-CM

## 2019-03-30 DIAGNOSIS — R7303 Prediabetes: Secondary | ICD-10-CM | POA: Diagnosis not present

## 2019-03-30 LAB — LIPID PANEL
Cholesterol: 145 mg/dL (ref 0–200)
HDL: 55.4 mg/dL (ref >39.00)
LDL Cholesterol: 73 mg/dL (ref 0–99)
NonHDL: 89.5
Total CHOL/HDL Ratio: 3
Triglycerides: 84 mg/dL (ref 0.0–149.0)
VLDL: 16.8 mg/dL (ref 0.0–40.0)

## 2019-03-30 LAB — COMPREHENSIVE METABOLIC PANEL
ALT: 15 U/L (ref 0–35)
AST: 19 U/L (ref 0–37)
Albumin: 4.4 g/dL (ref 3.5–5.2)
Alkaline Phosphatase: 64 U/L (ref 39–117)
BUN: 8 mg/dL (ref 6–23)
CO2: 29 mEq/L (ref 19–32)
Calcium: 10 mg/dL (ref 8.4–10.5)
Chloride: 94 mEq/L — ABNORMAL LOW (ref 96–112)
Creatinine, Ser: 0.7 mg/dL (ref 0.40–1.20)
GFR: 82.42 mL/min (ref >60.00)
Glucose, Bld: 99 mg/dL (ref 70–99)
Potassium: 4.3 mEq/L (ref 3.5–5.1)
Sodium: 130 mEq/L — ABNORMAL LOW (ref 135–145)
Total Bilirubin: 0.7 mg/dL (ref 0.2–1.2)
Total Protein: 7 g/dL (ref 6.0–8.3)

## 2019-03-30 LAB — CBC WITH DIFFERENTIAL/PLATELET
Basophils Absolute: 0.1 10*3/uL (ref 0.0–0.1)
Basophils Relative: 0.8 % (ref 0.0–3.0)
Eosinophils Absolute: 0.2 10*3/uL (ref 0.0–0.7)
Eosinophils Relative: 2.8 % (ref 0.0–5.0)
HCT: 40.9 % (ref 36.0–46.0)
Hemoglobin: 13.9 g/dL (ref 12.0–15.0)
Lymphocytes Relative: 30.9 % (ref 12.0–46.0)
Lymphs Abs: 2.1 10*3/uL (ref 0.7–4.0)
MCHC: 33.9 g/dL (ref 30.0–36.0)
MCV: 91.4 fl (ref 78.0–100.0)
Monocytes Absolute: 0.6 10*3/uL (ref 0.1–1.0)
Monocytes Relative: 9.3 % (ref 3.0–12.0)
Neutro Abs: 3.8 10*3/uL (ref 1.4–7.7)
Neutrophils Relative %: 56.2 % (ref 43.0–77.0)
Platelets: 299 10*3/uL (ref 150.0–400.0)
RBC: 4.48 Mil/uL (ref 3.87–5.11)
RDW: 12.7 % (ref 11.5–15.5)
WBC: 6.8 10*3/uL (ref 4.0–10.5)

## 2019-03-30 LAB — TSH: TSH: 1.86 u[IU]/mL (ref 0.35–4.50)

## 2019-03-30 LAB — HEMOGLOBIN A1C: Hgb A1c MFr Bld: 5.6 % (ref 4.6–6.5)

## 2019-03-30 LAB — VITAMIN D 25 HYDROXY (VIT D DEFICIENCY, FRACTURES): VITD: 70.19 ng/mL (ref 30.00–100.00)

## 2019-04-02 ENCOUNTER — Encounter: Payer: Self-pay | Admitting: Family Medicine

## 2019-04-05 ENCOUNTER — Other Ambulatory Visit: Payer: Self-pay

## 2019-04-05 ENCOUNTER — Ambulatory Visit (INDEPENDENT_AMBULATORY_CARE_PROVIDER_SITE_OTHER): Payer: Medicare HMO | Admitting: Family Medicine

## 2019-04-05 ENCOUNTER — Encounter: Payer: Self-pay | Admitting: Family Medicine

## 2019-04-05 VITALS — BP 134/80 | HR 74 | Temp 98.7°F | Ht 65.25 in | Wt 159.6 lb

## 2019-04-05 DIAGNOSIS — M8580 Other specified disorders of bone density and structure, unspecified site: Secondary | ICD-10-CM

## 2019-04-05 DIAGNOSIS — Z0000 Encounter for general adult medical examination without abnormal findings: Secondary | ICD-10-CM | POA: Diagnosis not present

## 2019-04-05 DIAGNOSIS — I10 Essential (primary) hypertension: Secondary | ICD-10-CM

## 2019-04-05 DIAGNOSIS — E559 Vitamin D deficiency, unspecified: Secondary | ICD-10-CM | POA: Diagnosis not present

## 2019-04-05 DIAGNOSIS — R7303 Prediabetes: Secondary | ICD-10-CM

## 2019-04-05 DIAGNOSIS — E7800 Pure hypercholesterolemia, unspecified: Secondary | ICD-10-CM | POA: Diagnosis not present

## 2019-04-05 MED ORDER — OMEPRAZOLE 20 MG PO CPDR: 20.0000 mg | DELAYED_RELEASE_CAPSULE | Freq: Two times a day (BID) | ORAL | 3 refills | Status: DC

## 2019-04-05 MED ORDER — SIMVASTATIN 20 MG PO TABS: ORAL_TABLET | ORAL | 3 refills | Status: DC

## 2019-04-05 MED ORDER — LISINOPRIL-HYDROCHLOROTHIAZIDE 20-25 MG PO TABS: 1.0000 | ORAL_TABLET | Freq: Every day | ORAL | 3 refills | Status: DC

## 2019-04-05 NOTE — Assessment & Plan Note (Signed)
Last dexa 5/18 -bmd normal (re assuring) No falls or fx Disc need for calcium/ vitamin D/ wt bearing exercise and bone density test every 2 y to monitor Disc safety/ fracture risk in detail    Will hold off another yr for dexa due to last result and pandemic

## 2019-04-05 NOTE — Assessment & Plan Note (Signed)
bp in fair control at this time  BP Readings from Last 1 Encounters:  04/05/19 134/80   No changes needed Most recent labs reviewed  Disc lifstyle change with low sodium diet and exercise  Na level is 130- likely due to hctz -will watch this  Pt plans to drink gatorade (diet) after sweating if needed

## 2019-04-05 NOTE — Assessment & Plan Note (Signed)
Reviewed health habits including diet and exercise and skin cancer prevention Reviewed appropriate screening tests for age  Also reviewed health mt list, fam hx and immunization status , as well as social and family history   See HPI Labs reviewed  Last dexa -nl bmd  Due for colonsocopy in December Will get a flu shot in the fall  Has an advance directive Hearing and vision good Not depressed  Wt stable  Enc her to continue healthy diet /exercise  

## 2019-04-05 NOTE — Assessment & Plan Note (Signed)
Disc goals for lipids and reasons to control them Rev last labs with pt Rev low sat fat diet in detail Stable and well controlled with zocor and diet

## 2019-04-05 NOTE — Patient Instructions (Addendum)
You will be due in December for your 10 year colonoscopy  If you need [place] to schedule it let us know   Get a flu shot in the fall   Take a look at the back exercises   Take care of yourself  Keep exercising  Use sunscreen

## 2019-04-05 NOTE — Progress Notes (Signed)
Subjective:    Patient ID: Adriana Marks, female    DOB: 10/26/1945, 73 y.o.   MRN: [id number]  HPI Here for amw as well as health mt exam and rev of chronic health problems  I have personally reviewed the Medicare Annual Wellness questionnaire and have noted 1. The patient's medical and social history 2. Their use of alcohol, tobacco or illicit drugs 3. Their current medications and supplements 4. The patient's functional ability including ADL's, fall risks, home safety risks and hearing or visual             impairment. 5. Diet and physical activities 6. Evidence for depression or mood disorders  The patients weight, height, BMI have been recorded in the chart and visual acuity is per eye clinic.  I have made referrals, counseling and provided education to the patient based review of the above and I have provided the pt with a written personalized care plan for preventive services. Reviewed and updated provider list, see scanned forms.  See scanned forms.  Routine anticipatory guidance given to patient.  See health maintenance. Colon cancer screening  Colonoscopy 12/10 Breast cancer screening   Mammogram 7/20  (sister just died of breast cancer) Self breast exam- no lumps or changes  Flu vaccine 9/19 Tetanus vaccine   Tdap 5/12 Pneumovax-completed Zoster vaccine 12/13 dexa 5/18- nl BMD (?past osteopenia) Falls-none Fractures-none  Supplements - vit D and ca  Vit D level 70.1 Exercise - walking regularly/ also has a bicycle on the way  Advance directive -she has a living will and poa  Cognitive function addressed- see scanned forms- and if abnormal then additional documentation follows.  No concerns at all  occ forgetful - nothing worse than anyone else  Still takes card of finances/own affairs  PMH and SH reviewed  Meds, vitals, and allergies reviewed.   ROS: See HPI.  Otherwise negative.    occ R buttock/hip pain- aleve as needed (runs down leg) Had a tick bite on  back of neck -occ scab   Weight Wt Readings from Last 3 Encounters:  04/05/19 159 lb 9 oz (72.4 kg)  04/07/18 159 lb 11.2 oz (72.4 kg)  03/27/18 158 lb 8 oz (71.9 kg)  weight is stable  26.35 kg/m   Hearing/vision  Hearing Screening   125Hz  250Hz  500Hz  1000Hz  2000Hz  3000Hz  4000Hz  6000Hz  8000Hz   Right ear:   40 40 40  40    Left ear:   40 40 40  40      Visual Acuity Screening   Right eye Left eye Both eyes  Without correction:     With correction: 20/20 20/20 20/20     PHQ screen She feels isolated occasionally    bp is stable today  No cp or palpitations or headaches or edema  No side effects to medicines  BP Readings from Last 3 Encounters:  04/05/19 (!) 144/78  04/07/18 (!) 162/93  03/27/18 132/82     Lab Results  Component Value Date   CREATININE 0.70 03/30/2019   BUN 8 03/30/2019   NA 130 (L) 03/30/2019   K 4.3 03/30/2019   CL 94 (L) 03/30/2019   CO2 29 03/30/2019   Lab Results  Component Value Date   ALT 15 03/30/2019   AST 19 03/30/2019   ALKPHOS 64 03/30/2019   BILITOT 0.7 03/30/2019    Lab Results  Component Value Date   WBC 6.8 03/30/2019   HGB 13.9 03/30/2019   HCT 40.9 03/30/2019  MCV 91.4 03/30/2019   PLT 299.0 03/30/2019   Lab Results  Component Value Date   TSH 1.86 03/30/2019     Hyperlipidemia  Lab Results  Component Value Date   CHOL 145 03/30/2019   CHOL 158 11/14/2017   CHOL 183 11/13/2016   Lab Results  Component Value Date   HDL 55.40 03/30/2019   HDL 61.50 11/14/2017   HDL 57.60 11/13/2016   Lab Results  Component Value Date   LDLCALC 73 03/30/2019   LDLCALC 86 11/14/2017   LDLCALC 103 (H) 11/13/2016   Lab Results  Component Value Date   TRIG 84.0 03/30/2019   TRIG 52.0 11/14/2017   TRIG 114.0 11/13/2016   Lab Results  Component Value Date   CHOLHDL 3 03/30/2019   CHOLHDL 3 11/14/2017   CHOLHDL 3 11/13/2016   No results found for: LDLDIRECT  zocor and diet   Prediabetes Lab Results  Component  Value Date   HGBA1C 5.6 03/30/2019  stable  She is mindful of sugar in her diet    Patient Active Problem List   Diagnosis Date Noted  . Dysuria 03/27/2018  . Breast cancer screening by mammogram 12/08/2017  . Estrogen deficiency 12/04/2016  . Infiltrate of lung present on imaging of chest 08/09/2016  . Grief reaction 09/09/2014  . Encounter for Medicare annual wellness exam 09/06/2013  . 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 Prediabetes 08/12/2012  . Other screening mammogram 03/22/2011  . Hyperlipidemia 01/01/2011  . Hypertension 01/01/2011  . GERD (gastroesophageal reflux disease) 01/01/2011  . Routine general medical examination at a health care facility 01/01/2011  . Vitamin D deficiency disease 01/01/2011  . Osteopenia 01/01/2011  . Sleep apnea 01/01/2011  . Anxiety 01/01/2011  . Allergic rhinitis 01/01/2011  . Rosacea 01/01/2011   Past Medical History:  Diagnosis Date  . Allergy   . Bronchitis   . Colon polyps   . Depression   . GERD (gastroesophageal reflux disease)   . Heart murmur   . History of chicken pox   . Hx: UTI (urinary tract infection)   . Hyperlipidemia   . Hypertension   . Migraines   . Rosacea   . Sleep apnea    uses cpap every night    Past Surgical History:  Procedure Laterality Date  . DNC    . FOOT SURGERY     Social History   Tobacco Use  . Smoking status: Former Games developermoker  . Smokeless tobacco: Never Used  Substance Use Topics  . Alcohol use: Yes    Alcohol/week: 0.0 standard drinks    Comment: wine occasional  . Drug use: No   Family History  Problem Relation Age of Onset  . Heart disease Father   . Hypertension Father   . Diabetes Other   . Heart disease Mother   . Stroke Paternal Grandmother   . Heart disease Paternal Grandfather   . Hypertension Paternal Grandfather   . Breast cancer Paternal Aunt   . Breast cancer Sister   . Cancer Sister   . Bladder Cancer Neg Hx   . Kidney cancer Neg Hx    Allergies  Allergen  Reactions  . Shellfish Allergy Anaphylaxis    Sob/ swelling  Sob/ swelling    Current Outpatient Medications on File Prior to Visit  Medication Sig Dispense Refill  . aspirin 81 MG tablet Take 81 mg by mouth daily.      . Cholecalciferol (VITAMIN D3) 1000 UNITS CAPS Take 5,000 capsules by mouth daily.     .Marland Kitchen  EPINEPHrine (EPI-PEN) 0.3 mg/0.3 mL SOAJ injection Inject 0.3 mLs (0.3 mg total) into the muscle once. For allergic reaction 1 Device 3  . loratadine (CLARITIN) 10 MG tablet Take 10 mg by mouth daily.    . Naproxen Sodium (ALEVE PO) Take 1 tablet by mouth as needed.    . [DISCONTINUED] atorvastatin (LIPITOR) 10 MG tablet Take 1 tablet (10 mg total) by mouth daily. 30 tablet 11   No current facility-administered medications on file prior to visit.     Review of Systems  Constitutional: Negative for activity change, appetite change, fatigue, fever and unexpected weight change.  HENT: Negative for congestion, ear pain, rhinorrhea, sinus pressure and sore throat.   Eyes: Negative for pain, redness and visual disturbance.  Respiratory: Negative for cough, shortness of breath and wheezing.   Cardiovascular: Negative for chest pain and palpitations.  Gastrointestinal: Negative for abdominal pain, blood in stool, constipation and diarrhea.  Endocrine: Negative for polydipsia and polyuria.  Genitourinary: Negative for dysuria, frequency and urgency.  Musculoskeletal: Positive for back pain. Negative for arthralgias and myalgias.  Skin: Negative for pallor and rash.  Allergic/Immunologic: Negative for environmental allergies.  Neurological: Negative for dizziness, syncope and headaches.  Hematological: Negative for adenopathy. Does not bruise/bleed easily.  Psychiatric/Behavioral: Negative for decreased concentration and dysphoric mood. The patient is not nervous/anxious.        Grief Lost sister       Objective:   Physical Exam Constitutional:      General: She is not in acute  distress.    Appearance: Normal appearance. She is well-developed and normal weight. She is not ill-appearing or diaphoretic.  HENT:     Head: Normocephalic and atraumatic.     Right Ear: Tympanic membrane, ear canal and external ear normal.     Left Ear: Tympanic membrane and external ear normal.     Nose: Nose normal.     Mouth/Throat:     Mouth: Mucous membranes are moist.     Pharynx: Oropharynx is clear. No posterior oropharyngeal erythema.  Eyes:     General: No scleral icterus.    Conjunctiva/sclera: Conjunctivae normal.     Pupils: Pupils are equal, round, and reactive to light.  Neck:     Musculoskeletal: Normal range of motion and neck supple. No neck rigidity or muscular tenderness.     Thyroid: No thyromegaly.     Vascular: No carotid bruit or JVD.  Cardiovascular:     Rate and Rhythm: Normal rate and regular rhythm.     Heart sounds: Murmur present. No gallop.   Pulmonary:     Effort: Pulmonary effort is normal. No respiratory distress.     Breath sounds: Normal breath sounds. No wheezing.     Comments: Good air exch Chest:     Chest wall: No tenderness.  Abdominal:     General: Bowel sounds are normal. There is no distension or abdominal bruit.     Palpations: Abdomen is soft. There is no mass.     Tenderness: There is no abdominal tenderness.  Genitourinary:    Comments: Breast exam: No mass, nodules, thickening, tenderness, bulging, retraction, inflamation, nipple discharge or skin changes noted.  No axillary or clavicular LA.     Musculoskeletal: Normal range of motion.        General: No tenderness.     Right lower leg: No edema.     Left lower leg: No edema.  Lymphadenopathy:     Cervical: No cervical adenopathy.  Skin:    General: Skin is warm and dry.     Coloration: Skin is not pale.     Findings: No erythema or rash.     Comments: Many sks on trunk   Tiny scab from old tick bite lower posterior scalp No retained tick parts or swelling   Solar  lentigines diffusely   Neurological:     Mental Status: She is alert. Mental status is at baseline.     Cranial Nerves: No cranial nerve deficit.     Motor: No abnormal muscle tone.     Coordination: Coordination normal.     Gait: Gait normal.     Deep Tendon Reflexes: Reflexes are normal and symmetric. Reflexes normal.  Psychiatric:        Mood and Affect: Mood normal.        Cognition and Memory: Cognition and memory normal.     Comments: Pleasant and cognitively sharp           Assessment & Plan:   Problem List Items Addressed This Visit      Cardiovascular and Mediastinum   Hypertension    bp in fair control at this time  BP Readings from Last 1 Encounters:  04/05/19 134/80   No changes needed Most recent labs reviewed  Disc lifstyle change with low sodium diet and exercise  Na level is 130- likely due to hctz -will watch this  Pt plans to drink gatorade (diet) after sweating if needed       Relevant Medications   lisinopril-hydrochlorothiazide (ZESTORETIC) 20-25 MG tablet   simvastatin (ZOCOR) 20 MG tablet     Musculoskeletal and Integument   Osteopenia    Last dexa 5/18 -bmd normal (re assuring) No falls or fx Disc need for calcium/ vitamin D/ wt bearing exercise and bone density test every 2 y to monitor Disc safety/ fracture risk in detail    Will hold off another yr for dexa due to last result and pandemic        Other   Hyperlipidemia    Disc goals for lipids and reasons to control them Rev last labs with pt Rev low sat fat diet in detail Stable and well controlled with zocor and diet      Relevant Medications   lisinopril-hydrochlorothiazide (ZESTORETIC) 20-25 MG tablet   simvastatin (ZOCOR) 20 MG tablet   Routine general medical examination at a health care facility    Reviewed health habits including diet and exercise and skin cancer prevention Reviewed appropriate screening tests for age  Also reviewed health mt list, fam hx and  immunization status , as well as social and family history   See HPI Labs reviewed  Last dexa -nl bmd  Due for colonsocopy in December Will get a flu shot in the fall  Has an advance directive Hearing and vision good Not depressed  Wt stable  Enc her to continue healthy diet /exercise        Vitamin D deficiency disease    Vitamin D level is therapeutic with current supplementation Disc importance of this to bone and overall health Level in 70s       Prediabetes    Lab Results  Component Value Date   HGBA1C 5.6 03/30/2019   disc imp of low glycemic diet and wt loss to prevent DM2       Encounter for Medicare annual wellness exam - Primary    Reviewed health habits including diet and exercise and skin cancer prevention  Reviewed appropriate screening tests for age  Also reviewed health mt list, fam hx and immunization status , as well as social and family history   See HPI Labs reviewed  Last dexa -nl bmd  Due for colonsocopy in December Will get a flu shot in the fall  Has an advance directive Hearing and vision good Not depressed  Wt stable  Enc her to continue healthy diet /exercise

## 2019-04-05 NOTE — Assessment & Plan Note (Signed)
Vitamin D level is therapeutic with current supplementation Disc importance of this to bone and overall health Level in 70s 

## 2019-04-05 NOTE — Assessment & Plan Note (Signed)
Lab Results  Component Value Date   HGBA1C 5.6 03/30/2019   disc imp of low glycemic diet and wt loss to prevent DM2  

## 2019-04-05 NOTE — Assessment & Plan Note (Signed)
Reviewed health habits including diet and exercise and skin cancer prevention Reviewed appropriate screening tests for age  Also reviewed health mt list, fam hx and immunization status , as well as social and family history   See HPI Labs reviewed  Last dexa -nl bmd  Due for colonsocopy in December Will get a flu shot in the fall  Has an advance directive Hearing and vision good Not depressed  Wt stable  Enc her to continue healthy diet /exercise

## 2019-06-04 DIAGNOSIS — R69 Illness, unspecified: Secondary | ICD-10-CM | POA: Diagnosis not present

## 2019-08-31 ENCOUNTER — Ambulatory Visit (INDEPENDENT_AMBULATORY_CARE_PROVIDER_SITE_OTHER): Payer: Medicare HMO | Admitting: Family Medicine

## 2019-08-31 ENCOUNTER — Encounter: Payer: Self-pay | Admitting: Family Medicine

## 2019-08-31 ENCOUNTER — Other Ambulatory Visit: Payer: Self-pay

## 2019-08-31 VITALS — BP 156/94 | HR 74 | Temp 97.9°F | Ht 65.25 in | Wt 159.1 lb

## 2019-08-31 DIAGNOSIS — R69 Illness, unspecified: Secondary | ICD-10-CM | POA: Diagnosis not present

## 2019-08-31 DIAGNOSIS — F430 Acute stress reaction: Secondary | ICD-10-CM | POA: Diagnosis not present

## 2019-08-31 DIAGNOSIS — I10 Essential (primary) hypertension: Secondary | ICD-10-CM | POA: Diagnosis not present

## 2019-08-31 DIAGNOSIS — L719 Rosacea, unspecified: Secondary | ICD-10-CM | POA: Diagnosis not present

## 2019-08-31 MED ORDER — METRONIDAZOLE 1 % EX GEL: Freq: Two times a day (BID) | CUTANEOUS | 3 refills | Status: AC

## 2019-08-31 MED ORDER — AMLODIPINE BESYLATE 5 MG PO TABS: 5.0000 mg | ORAL_TABLET | Freq: Every day | ORAL | 3 refills | Status: DC

## 2019-08-31 NOTE — Progress Notes (Signed)
Subjective:    Patient ID: Adriana Marks, female    DOB: 28-Aug-1945, 74 y.o.   MRN: [id number]  This visit occurred during the SARS-CoV-2 public health emergency.  Safety protocols were in place, including screening questions prior to the visit, additional usage of staff PPE, and extensive cleaning of exam room while observing appropriate contact time as indicated for disinfecting solutions.    HPI Here for rosacea symptoms   Noted bp has been up  Also more anxious during the pandemic   Wt Readings from Last 3 Encounters:  08/31/19 159 lb 2 oz (72.2 kg)  04/05/19 159 lb 9 oz (72.4 kg)  04/07/18 159 lb 11.2 oz (72.4 kg)  no changes  For exercise- she gets out to walk when weather permits  Does some upper body exercises at home  26.28 kg/m   Diet is good- the same /moderation   BP Readings from Last 3 Encounters:  08/31/19 (!) 156/94  04/05/19 134/80  04/07/18 (!) 162/93   Takes lisinoprol hct 20-25 mg    Lab Results  Component Value Date   CREATININE 0.70 03/30/2019   BUN 8 03/30/2019   NA 130 (L) 03/30/2019   K 4.3 03/30/2019   CL 94 (L) 03/30/2019   CO2 29 03/30/2019   bp has been going up at home - not as high as first check today    Rosacea is much worse from wearingher mask  She has issues in the T zone  Itches  Skin is red in generally  Pimple like breakouts -not pustules  No eye symptoms   Facial cleanser- aaveno cleanser  She avoids hot water  Moisturizer - cetaphil  Is good about sunscreen (makeup has sunscreen) - a CC cream   She has used metro gel in the past -it worked  Her insurance stopped paying for it    Patient Active Problem List   Diagnosis Date Noted  . Stress reaction 08/31/2019  . Dysuria 03/27/2018  . Breast cancer screening by mammogram 12/08/2017  . Estrogen deficiency 12/04/2016  . Infiltrate of lung present on imaging of chest 08/09/2016  . Grief reaction 09/09/2014  . Encounter for Medicare annual wellness exam  09/06/2013  . 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 Prediabetes 08/12/2012  . Other screening mammogram 03/22/2011  . Hyperlipidemia 01/01/2011  . Hypertension 01/01/2011  . GERD (gastroesophageal reflux disease) 01/01/2011  . Routine general medical examination at a health care facility 01/01/2011  . Vitamin D deficiency disease 01/01/2011  . Osteopenia 01/01/2011  . Sleep apnea 01/01/2011  . Anxiety 01/01/2011  . Allergic rhinitis 01/01/2011  . Rosacea 01/01/2011   Past Medical History:  Diagnosis Date  . Allergy   . Bronchitis   . Colon polyps   . Depression   . GERD (gastroesophageal reflux disease)   . Heart murmur   . History of chicken pox   . Hx: UTI (urinary tract infection)   . Hyperlipidemia   . Hypertension   . Migraines   . Rosacea   . Sleep apnea    uses cpap every night    Past Surgical History:  Procedure Laterality Date  . DNC    . FOOT SURGERY     Social History   Tobacco Use  . Smoking status: Former Research scientist (life sciences)  . Smokeless tobacco: Never Used  Substance Use Topics  . Alcohol use: Yes    Alcohol/week: 0.0 standard drinks    Comment: wine occasional  . Drug use: No  Family History  Problem Relation Age of Onset  . Heart disease Father   . Hypertension Father   . Diabetes Other   . Heart disease Mother   . Stroke Paternal Grandmother   . Heart disease Paternal Grandfather   . Hypertension Paternal Grandfather   . Breast cancer Paternal Aunt   . Breast cancer Sister   . Cancer Sister   . Bladder Cancer Neg Hx   . Kidney cancer Neg Hx    Allergies  Allergen Reactions  . Shellfish Allergy Anaphylaxis    Sob/ swelling  Sob/ swelling    Current Outpatient Medications on File Prior to Visit  Medication Sig Dispense Refill  . aspirin 81 MG tablet Take 81 mg by mouth daily.      . Cholecalciferol (VITAMIN D3) 1000 UNITS CAPS Take 5,000 capsules by mouth daily.     Marland Kitchen EPINEPHrine (EPI-PEN) 0.3 mg/0.3 mL SOAJ injection Inject 0.3 mLs (0.3 mg  total) into the muscle once. For allergic reaction 1 Device 3  . lisinopril-hydrochlorothiazide (ZESTORETIC) 20-25 MG tablet Take 1 tablet by mouth daily. 90 tablet 3  . loratadine (CLARITIN) 10 MG tablet Take 10 mg by mouth daily.    . Naproxen Sodium (ALEVE PO) Take 1 tablet by mouth as needed.    Marland Kitchen omeprazole (PRILOSEC) 20 MG capsule Take 1 capsule (20 mg total) by mouth 2 (two) times daily. 180 capsule 3  . simvastatin (ZOCOR) 20 MG tablet TAKE 1 TABLET BY MOUTH EVERYDAY AT BEDTIME 90 tablet 3  . [DISCONTINUED] atorvastatin (LIPITOR) 10 MG tablet Take 1 tablet (10 mg total) by mouth daily. 30 tablet 11   No current facility-administered medications on file prior to visit.     Review of Systems  Constitutional: Negative for activity change, appetite change, fatigue, fever and unexpected weight change.  HENT: Negative for congestion, ear pain, rhinorrhea, sinus pressure and sore throat.   Eyes: Negative for pain, redness and visual disturbance.  Respiratory: Negative for cough, shortness of breath and wheezing.   Cardiovascular: Negative for chest pain, palpitations and leg swelling.       Elevated bp  Gastrointestinal: Negative for abdominal pain, blood in stool, constipation and diarrhea.  Endocrine: Negative for polydipsia and polyuria.  Genitourinary: Negative for dysuria, frequency and urgency.  Musculoskeletal: Negative for arthralgias, back pain and myalgias.  Skin: Negative for pallor and rash.       Rosacea break outs   Allergic/Immunologic: Negative for environmental allergies.  Neurological: Negative for dizziness, syncope and headaches.  Hematological: Negative for adenopathy. Does not bruise/bleed easily.  Psychiatric/Behavioral: Positive for dysphoric mood. Negative for confusion, decreased concentration and suicidal ideas. The patient is nervous/anxious.        Still suffers from grief of loosing husband       Objective:   Physical Exam Constitutional:       General: She is not in acute distress.    Appearance: Normal appearance. She is well-developed. She is not ill-appearing or diaphoretic.  HENT:     Head: Normocephalic and atraumatic.  Eyes:     Conjunctiva/sclera: Conjunctivae normal.     Pupils: Pupils are equal, round, and reactive to light.  Neck:     Thyroid: No thyromegaly.     Vascular: No carotid bruit or JVD.  Cardiovascular:     Rate and Rhythm: Normal rate and regular rhythm.     Heart sounds: Normal heart sounds. No gallop.   Pulmonary:     Effort: Pulmonary effort is  normal. No respiratory distress.     Breath sounds: Normal breath sounds. No wheezing or rales.  Abdominal:     General: Bowel sounds are normal. There is no distension or abdominal bruit.     Palpations: Abdomen is soft. There is no mass.     Tenderness: There is no abdominal tenderness.  Musculoskeletal:     Cervical back: Normal range of motion and neck supple.     Right lower leg: No edema.     Left lower leg: No edema.  Lymphadenopathy:     Cervical: No cervical adenopathy.  Skin:    General: Skin is warm and dry.     Coloration: Skin is not pale.     Findings: No rash.     Comments: Papules on forehead /cheeks  Few telengectasias around nose No facial flushing today  Neurological:     Mental Status: She is alert.     Deep Tendon Reflexes: Reflexes are normal and symmetric.  Psychiatric:        Mood and Affect: Mood is anxious. Mood is not depressed. Affect is tearful.        Behavior: Behavior normal.        Cognition and Memory: Cognition normal.     Comments: Tearful when discussing grief  Speaks candidly about stressors            Assessment & Plan:   Problem List Items Addressed This Visit      Cardiovascular and Mediastinum   Hypertension - Primary    BP trending up  Anxiety may contribute somewhat  Disc imp of lifestyle change / exercise  DASH eating plan recommended  Added amlodipine 5 mg daily -inst to call if problems  or side eff  Continue lisinopril-hct F/u in 2 months       Relevant Medications   amLODipine (NORVASC) 5 MG tablet     Musculoskeletal and Integument   Rosacea    Struggling more with break outs from wearing mask Px metro gel to use bid (generic-hopefully insurance will cover)  Avoid hot water/harsh detergents and sun (use sun protection ) Alert if no improving        Other   Stress reaction    With anxious features Reviewed stressors/ coping techniques/symptoms/ support sources/ tx options and side effects in detail today Discussed non pharmacologic opt for tx including meditation, counseling and exercise

## 2019-08-31 NOTE — Assessment & Plan Note (Signed)
Struggling more with break outs from wearing mask Px metro gel to use bid (generic-hopefully insurance will cover)  Avoid hot water/harsh detergents and sun (use sun protection ) Alert if no improving

## 2019-08-31 NOTE — Patient Instructions (Addendum)
Continue your lisinopril hct and good health habits  Add amlodipine 5 mg once daily  If any side effects or problems let me know  Get back to using the metro gel- if any problems let me know   There are some great apps for meditation  Calm and Headspace are the most popular   Exercise helps also   Follow up in about 2 months

## 2019-08-31 NOTE — Assessment & Plan Note (Signed)
With anxious features Reviewed stressors/ coping techniques/symptoms/ support sources/ tx options and side effects in detail today Discussed non pharmacologic opt for tx including meditation, counseling and exercise

## 2019-08-31 NOTE — Assessment & Plan Note (Signed)
BP trending up  Anxiety may contribute somewhat  Disc imp of lifestyle change / exercise  DASH eating plan recommended  Added amlodipine 5 mg daily -inst to call if problems or side eff  Continue lisinopril-hct F/u in 2 months

## 2019-09-06 ENCOUNTER — Telehealth: Payer: Self-pay | Admitting: *Deleted

## 2019-09-06 NOTE — Telephone Encounter (Signed)
Received fax asking for additional info to try and get metrogel approved, form in your inbox

## 2019-09-06 NOTE — Telephone Encounter (Signed)
Form faxed

## 2019-09-06 NOTE — Telephone Encounter (Signed)
Done and in IN box 

## 2019-09-07 NOTE — Telephone Encounter (Signed)
PA was approved, pharmacy notified and letter placed in Dr. Royden Purl inbox for review and to send for scanning

## 2019-10-16 ENCOUNTER — Ambulatory Visit: Payer: Medicare HMO

## 2019-10-18 ENCOUNTER — Ambulatory Visit: Payer: Medicare HMO | Attending: Internal Medicine

## 2019-10-18 DIAGNOSIS — Z23 Encounter for immunization: Secondary | ICD-10-CM | POA: Insufficient documentation

## 2019-10-18 NOTE — Progress Notes (Signed)
   Covid-19 Vaccination Clinic  Name:  Adriana Marks    MRN: [id number] DOB: Sep 30, 1945  10/18/2019  Adriana Marks was observed post Covid-19 immunization for 15 minutes without incidence. She was provided with Vaccine Information Sheet and instruction to access the V-Safe system.   Adriana Marks was instructed to call 911 with any severe reactions post vaccine: Marland Kitchen Difficulty breathing  . Swelling of your face and throat  . A fast heartbeat  . A bad rash all over your body  . Dizziness and weakness    Immunizations Administered    Name Date Dose VIS Date Route   Pfizer COVID-19 Vaccine 10/18/2019  9:14 AM 0.3 mL 08/06/2019 Intramuscular   Manufacturer: [employer], [employer]   Lot: [id number]   NDC: [address]-5

## 2019-10-29 ENCOUNTER — Ambulatory Visit (INDEPENDENT_AMBULATORY_CARE_PROVIDER_SITE_OTHER): Payer: Medicare HMO | Admitting: Family Medicine

## 2019-10-29 ENCOUNTER — Other Ambulatory Visit: Payer: Self-pay

## 2019-10-29 ENCOUNTER — Encounter: Payer: Self-pay | Admitting: Family Medicine

## 2019-10-29 VITALS — BP 138/80 | HR 76 | Temp 98.2°F | Ht 65.25 in | Wt 160.0 lb

## 2019-10-29 DIAGNOSIS — F419 Anxiety disorder, unspecified: Secondary | ICD-10-CM

## 2019-10-29 DIAGNOSIS — R69 Illness, unspecified: Secondary | ICD-10-CM | POA: Diagnosis not present

## 2019-10-29 DIAGNOSIS — I10 Essential (primary) hypertension: Secondary | ICD-10-CM | POA: Diagnosis not present

## 2019-10-29 DIAGNOSIS — Z1211 Encounter for screening for malignant neoplasm of colon: Secondary | ICD-10-CM | POA: Insufficient documentation

## 2019-10-29 NOTE — Progress Notes (Signed)
Subjective:    Patient ID: Adriana Marks, female    DOB: 1945-12-14, 74 y.o.   MRN: [id number]  This visit occurred during the SARS-CoV-2 public health emergency.  Safety protocols were in place, including screening questions prior to the visit, additional usage of staff PPE, and extensive cleaning of exam room while observing appropriate contact time as indicated for disinfecting solutions.    HPI Here for 2 mo BP follow up   Wt Readings from Last 3 Encounters:  10/29/19 160 lb (72.6 kg)  08/31/19 159 lb 2 oz (72.2 kg)  04/05/19 159 lb 9 oz (72.4 kg)   26.42 kg/m   Last visit noted bp was trending up  More anxious as well  Disc DASH eating  Added amlodipine 5 mg   BP Readings from Last 3 Encounters:  10/29/19 (!) 144/90  08/31/19 (!) 156/94  04/05/19 134/80  on 2nd check BP: 138/80   At home - 130s/80s    Had her first covid shot   No problems with the amlodipine   Eating healthy- working on that   Not exercising as much due to weather  Needs indoor alternative   Has a spot on her R temple that is raised  Mood has been fair - frustrated by not being able to get out during the pandemic Doing better now  Has a lot to do  Thinks she is doing ok overall   Needs to do 10 y colonoscopy   Patient Active Problem List   Diagnosis Date Noted  . Colon cancer screening 10/29/2019  . Stress reaction 08/31/2019  . Dysuria 03/27/2018  . Breast cancer screening by mammogram 12/08/2017  . Estrogen deficiency 12/04/2016  . Infiltrate of lung present on imaging of chest 08/09/2016  . Grief reaction 09/09/2014  . Encounter for Medicare annual wellness exam 09/06/2013  . 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 Prediabetes 08/12/2012  . Other screening mammogram 03/22/2011  . Hyperlipidemia 01/01/2011  . Hypertension 01/01/2011  . GERD (gastroesophageal reflux disease) 01/01/2011  . Routine general medical examination at a health care facility 01/01/2011  . Vitamin D  deficiency disease 01/01/2011  . Osteopenia 01/01/2011  . Sleep apnea 01/01/2011  . Anxiety 01/01/2011  . Allergic rhinitis 01/01/2011  . Rosacea 01/01/2011   Past Medical History:  Diagnosis Date  . Allergy   . Bronchitis   . Colon polyps   . Depression   . GERD (gastroesophageal reflux disease)   . Heart murmur   . History of chicken pox   . Hx: UTI (urinary tract infection)   . Hyperlipidemia   . Hypertension   . Migraines   . Rosacea   . Sleep apnea    uses cpap every night    Past Surgical History:  Procedure Laterality Date  . DNC    . FOOT SURGERY     Social History   Tobacco Use  . Smoking status: Former Games developer  . Smokeless tobacco: Never Used  Substance Use Topics  . Alcohol use: Yes    Alcohol/week: 0.0 standard drinks    Comment: wine occasional  . Drug use: No   Family History  Problem Relation Age of Onset  . Heart disease Father   . Hypertension Father   . Diabetes Other   . Heart disease Mother   . Stroke Paternal Grandmother   . Heart disease Paternal Grandfather   . Hypertension Paternal Grandfather   . Breast cancer Paternal Aunt   . Breast cancer Sister   .  Cancer Sister   . Bladder Cancer Neg Hx   . Kidney cancer Neg Hx    Allergies  Allergen Reactions  . Shellfish Allergy Anaphylaxis    Sob/ swelling  Sob/ swelling    Current Outpatient Medications on File Prior to Visit  Medication Sig Dispense Refill  . amLODipine (NORVASC) 5 MG tablet Take 1 tablet (5 mg total) by mouth daily. 90 tablet 3  . aspirin 81 MG tablet Take 81 mg by mouth daily.      . Cholecalciferol (VITAMIN D3) 1000 UNITS CAPS Take 5,000 capsules by mouth daily.     Marland Kitchen EPINEPHrine (EPI-PEN) 0.3 mg/0.3 mL SOAJ injection Inject 0.3 mLs (0.3 mg total) into the muscle once. For allergic reaction 1 Device 3  . lisinopril-hydrochlorothiazide (ZESTORETIC) 20-25 MG tablet Take 1 tablet by mouth daily. 90 tablet 3  . loratadine (CLARITIN) 10 MG tablet Take 10 mg by mouth  daily.    . metroNIDAZOLE (METROGEL) 1 % gel Apply topically 2 (two) times daily. 180 g 3  . Naproxen Sodium (ALEVE PO) Take 1 tablet by mouth as needed.    Marland Kitchen omeprazole (PRILOSEC) 20 MG capsule Take 1 capsule (20 mg total) by mouth 2 (two) times daily. 180 capsule 3  . simvastatin (ZOCOR) 20 MG tablet TAKE 1 TABLET BY MOUTH EVERYDAY AT BEDTIME 90 tablet 3  . [DISCONTINUED] atorvastatin (LIPITOR) 10 MG tablet Take 1 tablet (10 mg total) by mouth daily. 30 tablet 11   No current facility-administered medications on file prior to visit.    Review of Systems  Constitutional: Negative for activity change, appetite change, fatigue, fever and unexpected weight change.  HENT: Negative for congestion, ear pain, rhinorrhea, sinus pressure and sore throat.   Eyes: Negative for pain, redness and visual disturbance.  Respiratory: Negative for cough, shortness of breath and wheezing.   Cardiovascular: Negative for chest pain and palpitations.  Gastrointestinal: Negative for abdominal pain, blood in stool, constipation and diarrhea.  Endocrine: Negative for polydipsia and polyuria.  Genitourinary: Negative for dysuria, frequency and urgency.  Musculoskeletal: Negative for arthralgias, back pain and myalgias.  Skin: Negative for pallor and rash.       Skin lesion over R temple  Allergic/Immunologic: Negative for environmental allergies.  Neurological: Negative for dizziness, syncope and headaches.  Hematological: Negative for adenopathy. Does not bruise/bleed easily.  Psychiatric/Behavioral: Negative for decreased concentration and dysphoric mood. The patient is not nervous/anxious.        Objective:   Physical Exam Constitutional:      General: She is not in acute distress.    Appearance: Normal appearance. She is well-developed. She is not ill-appearing or diaphoretic.  HENT:     Head: Normocephalic and atraumatic.     Right Ear: Tympanic membrane, ear canal and external ear normal.     Left  Ear: Tympanic membrane, ear canal and external ear normal.     Nose: Nose normal. No congestion.     Mouth/Throat:     Mouth: Mucous membranes are moist.     Pharynx: Oropharynx is clear. No posterior oropharyngeal erythema.  Eyes:     General: No scleral icterus.    Extraocular Movements: Extraocular movements intact.     Conjunctiva/sclera: Conjunctivae normal.     Pupils: Pupils are equal, round, and reactive to light.  Neck:     Thyroid: No thyromegaly.     Vascular: No carotid bruit or JVD.  Cardiovascular:     Rate and Rhythm: Normal rate and regular  rhythm.     Pulses: Normal pulses.     Heart sounds: Normal heart sounds. No gallop.   Pulmonary:     Effort: Pulmonary effort is normal. No respiratory distress.     Breath sounds: Normal breath sounds. No wheezing.     Comments: Good air exch Chest:     Chest wall: No tenderness.  Abdominal:     General: Bowel sounds are normal. There is no distension or abdominal bruit.     Palpations: Abdomen is soft. There is no mass.     Tenderness: There is no abdominal tenderness.     Hernia: No hernia is present.  Musculoskeletal:        General: No tenderness. Normal range of motion.     Cervical back: Normal range of motion and neck supple. No rigidity. No muscular tenderness.     Right lower leg: No edema.     Left lower leg: No edema.  Lymphadenopathy:     Cervical: No cervical adenopathy.  Skin:    General: Skin is warm and dry.     Coloration: Skin is not pale.     Findings: No erythema or rash.     Comments: Raised smooth flesh colored round 3 mm area on right temple    Symmetric   Neurological:     Mental Status: She is alert. Mental status is at baseline.     Cranial Nerves: No cranial nerve deficit.     Motor: No abnormal muscle tone.     Coordination: Coordination normal.     Gait: Gait normal.     Deep Tendon Reflexes: Reflexes are normal and symmetric.  Psychiatric:        Mood and Affect: Mood normal.            Assessment & Plan:   Problem List Items Addressed This Visit      Cardiovascular and Mediastinum   Hypertension - Primary    Improved here and at home with addn of amlodipine Urged to follow DASH earing plan  Continue to monitor Enc exercise as well -discussed indoor options for exercise         Other   Anxiety    Doing fairly well despite stress of the pandemic  Enc good self care and addition of exercise program      Colon cancer screening    Due for 10 y screening colonoscopy  Ref donw      Relevant Orders   Ambulatory referral to Gastroenterology

## 2019-10-29 NOTE — Patient Instructions (Addendum)
Work on some exercise Find some videos on u tube or subscribe to a service Get outdoors when you can   Continue current medicines  You will get a call from [place] about a colonoscopy   Take care of yourself

## 2019-10-31 NOTE — Assessment & Plan Note (Signed)
Doing fairly well despite stress of the pandemic  Enc good self care and addition of exercise program

## 2019-10-31 NOTE — Assessment & Plan Note (Signed)
Due for 10 y screening colonoscopy  Ref donw

## 2019-10-31 NOTE — Assessment & Plan Note (Signed)
Improved here and at home with addn of amlodipine Urged to follow DASH earing plan  Continue to monitor Enc exercise as well -discussed indoor options for exercise

## 2019-11-09 ENCOUNTER — Ambulatory Visit: Payer: Medicare HMO | Attending: Internal Medicine

## 2019-11-09 DIAGNOSIS — Z23 Encounter for immunization: Secondary | ICD-10-CM

## 2019-11-09 NOTE — Progress Notes (Signed)
   Covid-19 Vaccination Clinic  Name:  Adriana Marks    MRN: [id number] DOB: 1945/11/16  11/09/2019  Adriana Marks was observed post Covid-19 immunization for 15 minutes without incident. She was provided with Vaccine Information Sheet and instruction to access the V-Safe system.   Adriana Marks was instructed to call 911 with any severe reactions post vaccine: Marland Kitchen Difficulty breathing  . Swelling of face and throat  . A fast heartbeat  . A bad rash all over body  . Dizziness and weakness   Immunizations Administered    Name Date Dose VIS Date Route   Pfizer COVID-19 Vaccine 11/09/2019  9:14 AM 0.3 mL 08/06/2019 Intramuscular   Manufacturer: [employer], [employer]   Lot: [id number]   NDC: [address]-5

## 2019-11-16 ENCOUNTER — Ambulatory Visit: Payer: Medicare HMO

## 2020-01-06 ENCOUNTER — Telehealth: Payer: Self-pay | Admitting: *Deleted

## 2020-01-06 NOTE — Telephone Encounter (Signed)
PA sent in for pt's omeprazole with BID dosing, (insurance only covered once daily dosing) will await a response

## 2020-01-07 NOTE — Telephone Encounter (Signed)
PA approved for dates 08/27/19-08/25/20

## 2020-01-21 ENCOUNTER — Encounter: Payer: Self-pay | Admitting: Family Medicine

## 2020-01-24 ENCOUNTER — Encounter: Payer: Self-pay | Admitting: Family Medicine

## 2020-01-28 ENCOUNTER — Ambulatory Visit (INDEPENDENT_AMBULATORY_CARE_PROVIDER_SITE_OTHER): Payer: Medicare HMO | Admitting: Family Medicine

## 2020-01-28 ENCOUNTER — Other Ambulatory Visit: Payer: Self-pay

## 2020-01-28 ENCOUNTER — Encounter: Payer: Self-pay | Admitting: Family Medicine

## 2020-01-28 VITALS — BP 148/82 | HR 71 | Temp 97.8°F | Ht 65.25 in | Wt 159.3 lb

## 2020-01-28 DIAGNOSIS — F419 Anxiety disorder, unspecified: Secondary | ICD-10-CM

## 2020-01-28 DIAGNOSIS — R69 Illness, unspecified: Secondary | ICD-10-CM | POA: Diagnosis not present

## 2020-01-28 DIAGNOSIS — I10 Essential (primary) hypertension: Secondary | ICD-10-CM | POA: Diagnosis not present

## 2020-01-28 DIAGNOSIS — F430 Acute stress reaction: Secondary | ICD-10-CM | POA: Diagnosis not present

## 2020-01-28 MED ORDER — METOPROLOL SUCCINATE ER 25 MG PO TB24: 25.0000 mg | ORAL_TABLET | Freq: Every day | ORAL | 3 refills | Status: DC

## 2020-01-28 NOTE — Patient Instructions (Addendum)
I would like to consider counseling (talk therapy) for your anxiety  Consider calling your insurance to see how cost would be /copay for that  I would like to refer you   Keep up the walking   For blood pressure -lets try metoprolol xl 25 mg  Take it once daily   If you feel dizzy or your blood pressure gets too low 90/50  Or pulse rate below 50 -please stop it and let me know   Follow up in august as planned Continue to monitor at home

## 2020-01-28 NOTE — Assessment & Plan Note (Signed)
bp was well controlled with addn of amlodipine -but giving side eff of headache (that inc anxiety) BP: (!) 148/82 took just 1/2 pill last time Continues lisinopril hct  Disc options for tx (lifestyle habits are good)  Will try metoprolol xl 25 mg daily  inst to monitor bp and pulse and update if any side eff or intolerance  F/u about 6 wk

## 2020-01-28 NOTE — Assessment & Plan Note (Signed)
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

## 2020-01-28 NOTE — Assessment & Plan Note (Signed)
Worry about niece with renal failure is keeping her anxious

## 2020-01-28 NOTE — Progress Notes (Signed)
Subjective:    Patient ID: Adriana Marks, female    DOB: 02-02-1946, 74 y.o.   MRN: [id number]  This visit occurred during the SARS-CoV-2 public health emergency.  Safety protocols were in place, including screening questions prior to the visit, additional usage of staff PPE, and extensive cleaning of exam room while observing appropriate contact time as indicated for disinfecting solutions.    HPI Pt presents with concerns re: HTN and medications   Wt Readings from Last 3 Encounters:  01/28/20 159 lb 5 oz (72.3 kg)  10/29/19 160 lb (72.6 kg)  08/31/19 159 lb 2 oz (72.2 kg)   26.31 kg/m    Feels like her amlodipine is giving her anxiety  Takes it at night  Gets a sensation in head (funny) that makes her anxious   Took a xanax and it got better one day   Her anxiety was fairly stable until then  It does not take her a lot to feel symptoms of it   For anxiety-px sertraline and did not take it  She has stressors - family illness/worries   Taking care of herself -walking regularly     HTN Taking lisinopril hct Also amlodipine 5 mg daily    (just took a half tab last night)  Thinking of selling house     BP Readings from Last 3 Encounters:  01/28/20 (!) 148/82  10/29/19 138/80  08/31/19 (!) 156/94   Pulse Readings from Last 3 Encounters:  01/28/20 71  10/29/19 76  08/31/19 74   Lab Results  Component Value Date   NA 130 (L) 03/30/2019   K 4.3 03/30/2019   CO2 29 03/30/2019   GLUCOSE 99 03/30/2019   BUN 8 03/30/2019   CREATININE 0.70 03/30/2019   CALCIUM 10.0 03/30/2019   Patient Active Problem List   Diagnosis Date Noted  . Colon cancer screening 10/29/2019  . Stress reaction 08/31/2019  . Dysuria 03/27/2018  . Breast cancer screening by mammogram 12/08/2017  . Estrogen deficiency 12/04/2016  . Infiltrate of lung present on imaging of chest 08/09/2016  . Grief reaction 09/09/2014  . Encounter for Medicare annual wellness exam 09/06/2013  . Allergy  to shrimp 04/27/2013  . Prediabetes 08/12/2012  . Other screening mammogram 03/22/2011  . Hyperlipidemia 01/01/2011  . Hypertension 01/01/2011  . GERD (gastroesophageal reflux disease) 01/01/2011  . Routine general medical examination at a health care facility 01/01/2011  . Vitamin D deficiency disease 01/01/2011  . Osteopenia 01/01/2011  . Sleep apnea 01/01/2011  . Anxiety 01/01/2011  . Allergic rhinitis 01/01/2011  . Rosacea 01/01/2011   Past Medical History:  Diagnosis Date  . Allergy   . Bronchitis   . Colon polyps   . Depression   . GERD (gastroesophageal reflux disease)   . Heart murmur   . History of chicken pox   . Hx: UTI (urinary tract infection)   . Hyperlipidemia   . Hypertension   . Migraines   . Rosacea   . Sleep apnea    uses cpap every night    Past Surgical History:  Procedure Laterality Date  . DNC    . FOOT SURGERY     Social History   Tobacco Use  . Smoking status: Former Games developer  . Smokeless tobacco: Never Used  Substance Use Topics  . Alcohol use: Yes    Alcohol/week: 0.0 standard drinks    Comment: wine occasional  . Drug use: No   Family History  Problem Relation Age of  Onset  . Heart disease Father   . Hypertension Father   . Diabetes Other   . Heart disease Mother   . Stroke Paternal Grandmother   . Heart disease Paternal Grandfather   . Hypertension Paternal Grandfather   . Breast cancer Paternal Aunt   . Breast cancer Sister   . Cancer Sister   . Bladder Cancer Neg Hx   . Kidney cancer Neg Hx    Allergies  Allergen Reactions  . Shellfish Allergy Anaphylaxis    Sob/ swelling  Sob/ swelling   . Amlodipine     headache   Current Outpatient Medications on File Prior to Visit  Medication Sig Dispense Refill  . aspirin 81 MG tablet Take 81 mg by mouth daily.      . Cholecalciferol (VITAMIN D3) 1000 UNITS CAPS Take 5,000 capsules by mouth daily.     Marland Kitchen EPINEPHrine (EPI-PEN) 0.3 mg/0.3 mL SOAJ injection Inject 0.3 mLs (0.3 mg  total) into the muscle once. For allergic reaction 1 Device 3  . lisinopril-hydrochlorothiazide (ZESTORETIC) 20-25 MG tablet Take 1 tablet by mouth daily. 90 tablet 3  . loratadine (CLARITIN) 10 MG tablet Take 10 mg by mouth daily.    . metroNIDAZOLE (METROGEL) 1 % gel Apply topically 2 (two) times daily. 180 g 3  . Naproxen Sodium (ALEVE PO) Take 1 tablet by mouth as needed.    Marland Kitchen omeprazole (PRILOSEC) 20 MG capsule Take 1 capsule (20 mg total) by mouth 2 (two) times daily. 180 capsule 3  . simvastatin (ZOCOR) 20 MG tablet TAKE 1 TABLET BY MOUTH EVERYDAY AT BEDTIME 90 tablet 3  . [DISCONTINUED] atorvastatin (LIPITOR) 10 MG tablet Take 1 tablet (10 mg total) by mouth daily. 30 tablet 11   No current facility-administered medications on file prior to visit.     Review of Systems     Objective:   Physical Exam Constitutional:      General: She is not in acute distress.    Appearance: Normal appearance. She is well-developed and normal weight.  HENT:     Head: Normocephalic and atraumatic.  Eyes:     Conjunctiva/sclera: Conjunctivae normal.     Pupils: Pupils are equal, round, and reactive to light.  Neck:     Thyroid: No thyromegaly.     Vascular: No carotid bruit or JVD.  Cardiovascular:     Rate and Rhythm: Normal rate and regular rhythm.     Heart sounds: Murmur present. No gallop.   Pulmonary:     Effort: Pulmonary effort is normal. No respiratory distress.     Breath sounds: Normal breath sounds. No wheezing or rales.  Abdominal:     General: Bowel sounds are normal. There is no distension or abdominal bruit.     Palpations: Abdomen is soft. There is no mass.     Tenderness: There is no abdominal tenderness.  Musculoskeletal:     Cervical back: Normal range of motion and neck supple.     Right lower leg: No edema.     Left lower leg: No edema.  Lymphadenopathy:     Cervical: No cervical adenopathy.  Skin:    General: Skin is warm and dry.     Findings: No rash.      Comments: Fair complexion  Neurological:     Mental Status: She is alert.     Cranial Nerves: No cranial nerve deficit.     Sensory: No sensory deficit.     Motor: No weakness or tremor.  Coordination: Coordination normal.     Deep Tendon Reflexes: Reflexes are normal and symmetric. Reflexes normal.  Psychiatric:        Mood and Affect: Mood is anxious. Affect is tearful.        Thought Content: Thought content normal.        Cognition and Memory: Cognition and memory normal.     Comments: Anxious  occ tearful Candidly discusses stressors              Assessment & Plan:   Problem List Items Addressed This Visit      Cardiovascular and Mediastinum   Hypertension - Primary    bp was well controlled with addn of amlodipine -but giving side eff of headache (that inc anxiety) BP: (!) 148/82 took just 1/2 pill last time Continues lisinopril hct  Disc options for tx (lifestyle habits are good)  Will try metoprolol xl 25 mg daily  inst to monitor bp and pulse and update if any side eff or intolerance  F/u about 6 wk      Relevant Medications   metoprolol succinate (TOPROL-XL) 25 MG 24 hr tablet     Other   Anxiety    Pt gets anxious about her own health along with family issues (worrier) Usually able to control with exercise (walking) Lately cannot seem to do well when stressors inc I strongly recommend counseling / enc to check on ins re: coverage She was given sertraline in the past -did not take/ ssri is an option  Enc good lifestyle changes and self care       Stress reaction    Worry about niece with renal failure is keeping her anxious

## 2020-02-25 ENCOUNTER — Encounter: Payer: Self-pay | Admitting: Family Medicine

## 2020-02-28 ENCOUNTER — Encounter: Payer: Self-pay | Admitting: Family Medicine

## 2020-03-01 DIAGNOSIS — H40009 Preglaucoma, unspecified, unspecified eye: Secondary | ICD-10-CM | POA: Diagnosis not present

## 2020-03-01 DIAGNOSIS — H2513 Age-related nuclear cataract, bilateral: Secondary | ICD-10-CM | POA: Diagnosis not present

## 2020-03-01 DIAGNOSIS — H524 Presbyopia: Secondary | ICD-10-CM | POA: Diagnosis not present

## 2020-03-10 ENCOUNTER — Ambulatory Visit: Payer: Medicare HMO | Admitting: Family Medicine

## 2020-03-16 ENCOUNTER — Other Ambulatory Visit: Payer: Self-pay | Admitting: Family Medicine

## 2020-03-16 DIAGNOSIS — Z1231 Encounter for screening mammogram for malignant neoplasm of breast: Secondary | ICD-10-CM

## 2020-03-31 ENCOUNTER — Other Ambulatory Visit: Payer: Self-pay | Admitting: Family Medicine

## 2020-04-04 ENCOUNTER — Telehealth: Payer: Self-pay | Admitting: Family Medicine

## 2020-04-04 DIAGNOSIS — E559 Vitamin D deficiency, unspecified: Secondary | ICD-10-CM

## 2020-04-04 DIAGNOSIS — R7303 Prediabetes: Secondary | ICD-10-CM

## 2020-04-04 DIAGNOSIS — I10 Essential (primary) hypertension: Secondary | ICD-10-CM

## 2020-04-04 DIAGNOSIS — E7800 Pure hypercholesterolemia, unspecified: Secondary | ICD-10-CM

## 2020-04-04 NOTE — Telephone Encounter (Signed)
-----   Message from Aquilla Solian, RT sent at 03/22/2020  1:50 PM EDT ----- Regarding: Lab Orders for Wednesday 8.11.2021 Please place lab orders for Wednesday 8.11.2021, office visit for physical on Wednesday 8.18.2021 Thank you, Jones Bales RT(R)

## 2020-04-05 ENCOUNTER — Ambulatory Visit (INDEPENDENT_AMBULATORY_CARE_PROVIDER_SITE_OTHER): Payer: Medicare HMO

## 2020-04-05 ENCOUNTER — Other Ambulatory Visit: Payer: Self-pay

## 2020-04-05 ENCOUNTER — Other Ambulatory Visit (INDEPENDENT_AMBULATORY_CARE_PROVIDER_SITE_OTHER): Payer: Medicare HMO

## 2020-04-05 DIAGNOSIS — E7800 Pure hypercholesterolemia, unspecified: Secondary | ICD-10-CM | POA: Diagnosis not present

## 2020-04-05 DIAGNOSIS — Z0000 Encounter for general adult medical examination without abnormal findings: Secondary | ICD-10-CM

## 2020-04-05 DIAGNOSIS — R7303 Prediabetes: Secondary | ICD-10-CM

## 2020-04-05 DIAGNOSIS — E559 Vitamin D deficiency, unspecified: Secondary | ICD-10-CM

## 2020-04-05 DIAGNOSIS — I10 Essential (primary) hypertension: Secondary | ICD-10-CM | POA: Diagnosis not present

## 2020-04-05 LAB — LIPID PANEL
Cholesterol: 154 mg/dL (ref 0–200)
HDL: 60.1 mg/dL (ref >39.00)
LDL Cholesterol: 80 mg/dL (ref 0–99)
NonHDL: 94.18
Total CHOL/HDL Ratio: 3
Triglycerides: 72 mg/dL (ref 0.0–149.0)
VLDL: 14.4 mg/dL (ref 0.0–40.0)

## 2020-04-05 LAB — CBC WITH DIFFERENTIAL/PLATELET
Basophils Absolute: 0.1 10*3/uL (ref 0.0–0.1)
Basophils Relative: 1.1 % (ref 0.0–3.0)
Eosinophils Absolute: 0.1 10*3/uL (ref 0.0–0.7)
Eosinophils Relative: 2.3 % (ref 0.0–5.0)
HCT: 39.4 % (ref 36.0–46.0)
Hemoglobin: 13.6 g/dL (ref 12.0–15.0)
Lymphocytes Relative: 31.3 % (ref 12.0–46.0)
Lymphs Abs: 1.8 10*3/uL (ref 0.7–4.0)
MCHC: 34.5 g/dL (ref 30.0–36.0)
MCV: 90.7 fl (ref 78.0–100.0)
Monocytes Absolute: 0.6 10*3/uL (ref 0.1–1.0)
Monocytes Relative: 10.7 % (ref 3.0–12.0)
Neutro Abs: 3.2 10*3/uL (ref 1.4–7.7)
Neutrophils Relative %: 54.6 % (ref 43.0–77.0)
Platelets: 310 10*3/uL (ref 150.0–400.0)
RBC: 4.35 Mil/uL (ref 3.87–5.11)
RDW: 12.5 % (ref 11.5–15.5)
WBC: 5.9 10*3/uL (ref 4.0–10.5)

## 2020-04-05 LAB — COMPREHENSIVE METABOLIC PANEL
ALT: 14 U/L (ref 0–35)
AST: 19 U/L (ref 0–37)
Albumin: 4.4 g/dL (ref 3.5–5.2)
Alkaline Phosphatase: 68 U/L (ref 39–117)
BUN: 9 mg/dL (ref 6–23)
CO2: 26 mEq/L (ref 19–32)
Calcium: 10 mg/dL (ref 8.4–10.5)
Chloride: 93 mEq/L — ABNORMAL LOW (ref 96–112)
Creatinine, Ser: 0.72 mg/dL (ref 0.40–1.20)
GFR: 79.55 mL/min (ref >60.00)
Glucose, Bld: 97 mg/dL (ref 70–99)
Potassium: 4.1 mEq/L (ref 3.5–5.1)
Sodium: 130 mEq/L — ABNORMAL LOW (ref 135–145)
Total Bilirubin: 0.7 mg/dL (ref 0.2–1.2)
Total Protein: 7.1 g/dL (ref 6.0–8.3)

## 2020-04-05 LAB — VITAMIN D 25 HYDROXY (VIT D DEFICIENCY, FRACTURES): VITD: 62.15 ng/mL (ref 30.00–100.00)

## 2020-04-05 LAB — TSH: TSH: 4.25 u[IU]/mL (ref 0.35–4.50)

## 2020-04-05 LAB — HEMOGLOBIN A1C: Hgb A1c MFr Bld: 5.5 % (ref 4.6–6.5)

## 2020-04-05 NOTE — Progress Notes (Signed)
Subjective:   Adriana Marks is a 74 y.o. female who presents for Medicare Annual (Subsequent) preventive examination.  Review of Systems: N/A      I connected with the patient today by telephone and verified that I am speaking with the correct person using two identifiers. Location patient: home Location nurse: work Persons participating in the virtual visit: patient, Engineer, civil (consulting).   I discussed the limitations, risks, security and privacy concerns of performing an evaluation and management service by telephone and the availability of in person appointments. I also discussed with the patient that there may be a patient responsible charge related to this service. The patient expressed understanding and verbally consented to this telephonic visit.    Interactive audio and video telecommunications were attempted between this nurse and patient, however failed, due to patient having technical difficulties OR patient did not have access to video capability.  We continued and completed visit with audio only.     Cardiac Risk Factors include: advanced age (>64men, >76 women);hypertension;dyslipidemia     Objective:    Today's Vitals   There is no height or weight on file to calculate BMI.  Advanced Directives 04/05/2020 11/14/2017 11/13/2016 11/06/2015  Does Patient Have a Medical Advance Directive? Yes Yes Yes Yes  Type of Estate agent of [place];Living will Healthcare Power of [place];Living will Healthcare Power of [place];Living will Healthcare Power of [place];Living will  Does patient want to make changes to medical advance directive? - - - No - Patient declined  Copy of Healthcare Power of Attorney in Chart? No - copy requested No - copy requested No - copy requested No - copy requested    Current Medications (verified) Outpatient Encounter Medications as of 04/05/2020  Medication Sig  . aspirin 81 MG tablet Take 81 mg by mouth daily.    . Cholecalciferol  (VITAMIN D3) 1000 UNITS CAPS Take 5,000 capsules by mouth daily.   Marland Kitchen EPINEPHrine (EPI-PEN) 0.3 mg/0.3 mL SOAJ injection Inject 0.3 mLs (0.3 mg total) into the muscle once. For allergic reaction  . lisinopril-hydrochlorothiazide (ZESTORETIC) 20-25 MG tablet Take 1 tablet by mouth daily.  Marland Kitchen loratadine (CLARITIN) 10 MG tablet Take 10 mg by mouth daily.  . metoprolol succinate (TOPROL-XL) 25 MG 24 hr tablet Take 1 tablet (25 mg total) by mouth daily.  . metroNIDAZOLE (METROGEL) 1 % gel Apply topically 2 (two) times daily.  . Naproxen Sodium (ALEVE PO) Take 1 tablet by mouth as needed.  Marland Kitchen omeprazole (PRILOSEC) 20 MG capsule Take 1 capsule (20 mg total) by mouth 2 (two) times daily.  . simvastatin (ZOCOR) 20 MG tablet TAKE 1 TABLET BY MOUTH EVERYDAY AT BEDTIME  . [DISCONTINUED] atorvastatin (LIPITOR) 10 MG tablet Take 1 tablet (10 mg total) by mouth daily.   No facility-administered encounter medications on file as of 04/05/2020.    Allergies (verified) Shellfish allergy, Amlodipine, and Metoprolol   History: Past Medical History:  Diagnosis Date  . Allergy   . Bronchitis   . Colon polyps   . Depression   . GERD (gastroesophageal reflux disease)   . Heart murmur   . History of chicken pox   . Hx: UTI (urinary tract infection)   . Hyperlipidemia   . Hypertension   . Migraines   . Rosacea   . Sleep apnea    uses cpap every night    Past Surgical History:  Procedure Laterality Date  . DNC    . FOOT SURGERY     Family History  Problem Relation Age of Onset  . Heart disease Father   . Hypertension Father   . Diabetes Other   . Heart disease Mother   . Stroke Paternal Grandmother   . Heart disease Paternal Grandfather   . Hypertension Paternal Grandfather   . Breast cancer Paternal Aunt   . Breast cancer Sister   . Cancer Sister   . Bladder Cancer Neg Hx   . Kidney cancer Neg Hx    Social History   Socioeconomic History  . Marital status: Widowed    Spouse name: Not on  file  . Number of children: Not on file  . Years of education: Not on file  . Highest education level: Not on file  Occupational History  . Occupation: Fish farm manager: [employer]  Tobacco Use  . Smoking status: Former Games developer  . Smokeless tobacco: Never Used  Vaping Use  . Vaping Use: Never used  Substance and Sexual Activity  . Alcohol use: Yes    Alcohol/week: 0.0 standard drinks    Comment: wine occasional  . Drug use: No  . Sexual activity: Never  Other Topics Concern  . 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 Difficulty of Paying Living Expenses: Not hard at all  Food Insecurity: No Food Insecurity  . Worried About Programme researcher, broadcasting/film/video in the Last Year: Never true  . Ran Out of Food in the Last Year: Never true  Transportation Needs: No Transportation Needs  . Lack of Transportation (Medical): No  . Lack of Transportation (Non-Medical): No  Physical Activity: Sufficiently Active  . Days of Exercise per Week: 7 days  . Minutes of Exercise per Session: 30 min  Stress: No Stress Concern Present  . Feeling of Stress : Not at all  Social Connections:   . Frequency of Communication with Friends and Family:   . Frequency of Social Gatherings with Friends and Family:   . Attends Religious Services:   . Active Member of Clubs or Organizations:   . Attends Banker Meetings:   Marland Kitchen Marital Status:     Tobacco Counseling Counseling given: Not Answered   Clinical Intake:  Pre-visit preparation completed: Yes  Pain : No/denies pain     Nutritional Risks: None Diabetes: No  How often do you need to have someone  help you when you read instructions, pamphlets, or other written materials from your doctor or pharmacy?: 1 - Never What is the last grade level you completed in school?: college graduate  Diabetic: No Nutrition Risk Assessment:  Has the patient had any N/V/D within the last 2 months?  No  Does the patient have any non-healing wounds?  No  Has the patient had any unintentional weight loss or weight gain?  No   Diabetes:  Is the patient diabetic?  No  If diabetic, was a CBG obtained today?  N/A Did the patient bring in their glucometer from home?  N/A  How often do you monitor your CBG's? N/A.   Financial Strains and Diabetes Management:  Are you having any financial strains with the device, your supplies or your medication? N/A.  Does the patient want to be seen by Chronic Care Management for management of their diabetes?  N/A Would the patient like to be referred to a Nutritionist or for Diabetic Management?  N/A     Interpreter Needed?: No  Information entered by :: CJohnson, LPN   Activities of Daily Living In your present state of health, do you have any difficulty performing the following activities: 04/05/2020  Hearing? N  Vision? N  Difficulty concentrating or making decisions? N  Walking or climbing stairs? N  Dressing or bathing? N  Doing errands, shopping? N  Preparing Food and eating ? N  Using the Toilet? N  In the past six months, have you accidently leaked urine? N  Do you have problems with loss of bowel control? N  Managing your Medications? N  Managing your Finances? N  Housekeeping or managing your Housekeeping? N  Some recent data might be hidden    Patient Care Team: [name], [name], MD as PCP - General (Family Medicine)  Indicate any recent Medical Services you may have received from other than [facility] providers in the past year (date may be approximate).     Assessment:   This is a routine wellness examination for [facility].  Hearing/Vision  screen  Hearing Screening             Right ear:           Left ear:           Vision Screening Comments: Patient gets annual eye exams  Dietary issues and exercise activities discussed: Current Exercise Habits: Home exercise routine, Type of exercise: walking, Time (Minutes): 30, Frequency (Times/Week): 7, Weekly Exercise (Minutes/Week): 210, Intensity: Moderate, Exercise limited by: None identified  Goals    .  emotional health (pt-stated)      Starting 11/06/2015, I will spend 1 hour daily doing an activity just for my own personal mental health.       .  Increase physical activity      Starting 11/13/16, I will continue to walk at least 30 min daily.     .  Increase physical activity      When weather permits, I will resume walking for 30 minutes daily.     .  Patient Stated      04/05/2020, I will continue to walk everyday for about 30 minutes.       Depression Screen PHQ 2/9 Scores 04/05/2020 10/29/2019 04/05/2019 11/14/2017 11/13/2016 11/06/2015 09/09/2014  PHQ - 2 Score 0 4 0 PHQ- 9 Score 0 6 - -    Fall Risk Fall Risk  04/05/2020 04/05/2019 11/14/2017 11/13/2016 11/06/2015  Falls in the past year? 0 0 Yes No Yes  Number falls in past yr: 0 0 1 - 1  Injury with Fall? 0 0 No - No  Risk for fall due to : Medication side effect - - - -  Follow up Falls evaluation completed;Falls prevention discussed Falls evaluation completed - - Falls evaluation completed    Any stairs in or around the home? Yes  If so, are there any without handrails? No  Home free of loose throw rugs in walkways, pet beds, electrical cords, etc? Yes  Adequate lighting in your home to reduce risk of falls?  Yes   ASSISTIVE DEVICES UTILIZED TO PREVENT FALLS:  Life alert? No  Use of a cane, walker or w/c? No  Grab bars in the bathroom? No  Shower chair or bench in shower? No  Elevated toilet seat or a handicapped toilet? No   TIMED UP AND  GO:  Was the test performed? N/A, telephonic visit .   Cognitive Function: MMSE - Mini Mental State Exam 04/05/2020 11/14/2017 11/13/2016 11/06/2015  Orientation to time 5 5 5 5   Orientation to Place 5 5 5 5   Registration 3 3 3 3   Attention/ Calculation 5 0 0 5  Recall 3 3 3 3   Language- name 2 objects - 0 0 0  Language- repeat 1 1 1 1   Language- follow 3 step command - 3 3 3   Language- read & follow direction - 0 0 1  Write a sentence - 0 0 0  Copy design - 0 0 0  Total score - 20 20 26   Mini Cog  Mini-Cog screen was completed. Maximum score is 22. A value of 0 denotes this part of the MMSE was not completed or the patient failed this part of the Mini-Cog screening.       Immunizations Immunization History  Administered Date(s) Administered  . Influenza, High Dose Seasonal PF 05/02/2017, 05/06/2018  . Influenza,inj,Quad PF,6+ Mos 06/04/2019  . Influenza-Unspecified 05/18/2013, 05/11/2016  . PFIZER SARS-COV-2 Vaccination 10/18/2019, 11/09/2019  . PPD Test 02/02/2013, 04/24/2016  . Pneumococcal Conjugate-13 09/09/2014  . Pneumococcal Polysaccharide-23 11/14/2017  . Pneumococcal-Unspecified 08/27/2012  . Tdap 01/01/2011  . Zoster 08/13/2012    TDAP status: Up to date Flu Vaccine status: due, will be available at the end of the month in the office Pneumococcal vaccine status: Up to date Covid-19 vaccine status: Completed vaccines  Qualifies for Shingles Vaccine? Yes   Zostavax completed Yes   Shingrix Completed?: No.    Education has been provided regarding the importance of this vaccine. Patient has been advised to call insurance company to determine out of pocket expense if they have not yet received this vaccine. Advised may also receive vaccine at local pharmacy or Health Dept. Verbalized acceptance and understanding.  Screening Tests Health Maintenance  Topic Date Due  . COLONOSCOPY  07/27/2019  . MAMMOGRAM  03/18/2020  . INFLUENZA VACCINE  03/26/2020  .  TETANUS/TDAP  12/31/2020  . DEXA SCAN  Completed  . COVID-19 Vaccine  Completed  . Hepatitis C Screening  Completed  . PNA vac Low Risk Adult  Completed    Health Maintenance  Health Maintenance Due  Topic Date Due  . COLONOSCOPY  07/27/2019  . MAMMOGRAM  03/18/2020  . INFLUENZA VACCINE  03/26/2020    Colorectal cancer screening: due, Patient states provider has ordered referral and she will schedule this when she gets someone to accompany her Mammogram status: scheduled 04/20/2020 Bone Density status: due, will discuss with provider  Lung Cancer Screening: (Low Dose CT Chest recommended if Age 57-80 years, 30 pack-year currently smoking OR have quit w/in 15years.) does not qualify.     Additional Screening:  Hepatitis C Screening: does qualify; Completed 11/06/2015  Vision Screening: Recommended annual ophthalmology exams for early detection of glaucoma and other disorders of the eye. Is the patient up to date with their annual eye exam?  Yes  Who is the provider or what is the name of the office in which the patient attends annual eye exams? [facility] If pt is not established with a provider,  would they like to be referred to a provider to establish care? No .   Dental Screening: Recommended annual dental exams for proper oral hygiene  Community Resource Referral / Chronic Care Management: CRR required this visit?  No   CCM required this visit?  No      Plan:     I have personally reviewed and noted the following in the patient's chart:   . Medical and social history . Use of alcohol, tobacco or illicit drugs  . Current medications and supplements . Functional ability and status . Nutritional status . Physical activity . Advanced directives . List of other physicians . Hospitalizations, surgeries, and ER visits in previous 12 months . Vitals . Screenings to include cognitive, depression, and falls . Referrals and appointments  In addition, I have  reviewed and discussed with patient certain preventive protocols, quality metrics, and best practice recommendations. A written personalized care plan for preventive services as well as general preventive health recommendations were provided to patient.   Due to this being a telephonic visit, the after visit summary with patients personalized plan was offered to patient via mail or my-chart. Patient preferred to pick up at office at next visit.   Janalyn Shy, LPN   [phone]

## 2020-04-05 NOTE — Patient Instructions (Signed)
Adriana Marks , Thank you for taking time to come for your Medicare Wellness Visit. I appreciate your ongoing commitment to your health goals. Please review the following plan we discussed and let me know if I can assist you in the future.   Screening recommendations/referrals: Colonoscopy: due, Patient states provider has ordered referral and she will schedule this when she gets someone to accompany her Mammogram: scheduled 04/20/2020 Bone Density: due, will discuss with provider Recommended yearly ophthalmology/optometry visit for glaucoma screening and checkup Recommended yearly dental visit for hygiene and checkup  Vaccinations: Influenza vaccine: due, will be available in the office at the end of August Pneumococcal vaccine: Completed series Tdap vaccine: Up to date, completed 01/01/2011, due 12/2020 Shingles vaccine: due, check with your insurance regarding coverage    Covid-19:Completed series  Advanced directives: Please bring a copy of your POA (Power of [place]) and/or Living Will to your next appointment.   Conditions/risks identified: hypertension, hyperlipidemia  Next appointment: Follow up in one year for your annual wellness visit    Preventive Care 65 Years and Older, Female Preventive care refers to lifestyle choices and visits with your health care provider that can promote health and wellness. What does preventive care include?  A yearly physical exam. This is also called an annual well check.  Dental exams once or twice a year.  Routine eye exams. Ask your health care provider how often you should have your eyes checked.  Personal lifestyle choices, including:  Daily care of your teeth and gums.  Regular physical activity.  Eating a healthy diet.  Avoiding tobacco and drug use.  Limiting alcohol use.  Practicing safe sex.  Taking low-dose aspirin every day.  Taking vitamin and mineral supplements as recommended by your health care provider. What  happens during an annual well check? The services and screenings done by your health care provider during your annual well check will depend on your age, overall health, lifestyle risk factors, and family history of disease. Counseling  Your health care provider may ask you questions about your:  Alcohol use.  Tobacco use.  Drug use.  Emotional well-being.  Home and relationship well-being.  Sexual activity.  Eating habits.  History of falls.  Memory and ability to understand (cognition).  Work and work Astronomer.  Reproductive health. Screening  You may have the following tests or measurements:  Height, weight, and BMI.  Blood pressure.  Lipid and cholesterol levels. These may be checked every 5 years, or more frequently if you are over 44 years old.  Skin check.  Lung cancer screening. You may have this screening every year starting at age 64 if you have a 30-pack-year history of smoking and currently smoke or have quit within the past 15 years.  Fecal occult blood test (FOBT) of the stool. You may have this test every year starting at age 95.  Flexible sigmoidoscopy or colonoscopy. You may have a sigmoidoscopy every 5 years or a colonoscopy every 10 years starting at age 42.  Hepatitis C blood test.  Hepatitis B blood test.  Sexually transmitted disease (STD) testing.  Diabetes screening. This is done by checking your blood sugar (glucose) after you have not eaten for a while (fasting). You may have this done every 1-3 years.  Bone density scan. This is done to screen for osteoporosis. You may have this done starting at age 42.  Mammogram. This may be done every 1-2 years. Talk to your health care provider about how often you should  have regular mammograms. Talk with your health care provider about your test results, treatment options, and if necessary, the need for more tests. Vaccines  Your health care provider may recommend certain vaccines, such as:   Influenza vaccine. This is recommended every year.  Tetanus, diphtheria, and acellular pertussis (Tdap, Td) vaccine. You may need a Td booster every 10 years.  Zoster vaccine. You may need this after age 21.  Pneumococcal 13-valent conjugate (PCV13) vaccine. One dose is recommended after age 31.  Pneumococcal polysaccharide (PPSV23) vaccine. One dose is recommended after age 48. Talk to your health care provider about which screenings and vaccines you need and how often you need them. This information is not intended to replace advice given to you by your health care provider. Make sure you discuss any questions you have with your health care provider. Document Released: 09/08/2015 Document Revised: 05/01/2016 Document Reviewed: 06/13/2015 Elsevier Interactive Patient Education  2017 [place] Prevention in the Home Falls can cause injuries. They can happen to people of all ages. There are many things you can do to make your home safe and to help prevent falls. What can I do on the outside of my home?  Regularly fix the edges of walkways and driveways and fix any cracks.  Remove anything that might make you trip as you walk through a door, such as a raised step or threshold.  Trim any bushes or trees on the path to your home.  Use bright outdoor lighting.  Clear any walking paths of anything that might make someone trip, such as rocks or tools.  Regularly check to see if handrails are loose or broken. Make sure that both sides of any steps have handrails.  Any raised decks and porches should have guardrails on the edges.  Have any leaves, snow, or ice cleared regularly.  Use sand or salt on walking paths during winter.  Clean up any spills in your garage right away. This includes oil or grease spills. What can I do in the bathroom?  Use night lights.  Install grab bars by the toilet and in the tub and shower. Do not use towel bars as grab bars.  Use non-skid mats  or decals in the tub or shower.  If you need to sit down in the shower, use a plastic, non-slip stool.  Keep the floor dry. Clean up any water that spills on the floor as soon as it happens.  Remove soap buildup in the tub or shower regularly.  Attach bath mats securely with double-sided non-slip rug tape.  Do not have throw rugs and other things on the floor that can make you trip. What can I do in the bedroom?  Use night lights.  Make sure that you have a light by your bed that is easy to reach.  Do not use any sheets or blankets that are too big for your bed. They should not hang down onto the floor.  Have a firm chair that has side arms. You can use this for support while you get dressed.  Do not have throw rugs and other things on the floor that can make you trip. What can I do in the kitchen?  Clean up any spills right away.  Avoid walking on wet floors.  Keep items that you use a lot in easy-to-reach places.  If you need to reach something above you, use a strong step stool that has a grab bar.  Keep electrical cords out of  the way.  Do not use floor polish or wax that makes floors slippery. If you must use wax, use non-skid floor wax.  Do not have throw rugs and other things on the floor that can make you trip. What can I do with my stairs?  Do not leave any items on the stairs.  Make sure that there are handrails on both sides of the stairs and use them. Fix handrails that are broken or loose. Make sure that handrails are as long as the stairways.  Check any carpeting to make sure that it is firmly attached to the stairs. Fix any carpet that is loose or worn.  Avoid having throw rugs at the top or bottom of the stairs. If you do have throw rugs, attach them to the floor with carpet tape.  Make sure that you have a light switch at the top of the stairs and the bottom of the stairs. If you do not have them, ask someone to add them for you. What else can I do to  help prevent falls?  Wear shoes that:  Do not have high heels.  Have rubber bottoms.  Are comfortable and fit you well.  Are closed at the toe. Do not wear sandals.  If you use a stepladder:  Make sure that it is fully opened. Do not climb a closed stepladder.  Make sure that both sides of the stepladder are locked into place.  Ask someone to hold it for you, if possible.  Clearly mark and make sure that you can see:  Any grab bars or handrails.  First and last steps.  Where the edge of each step is.  Use tools that help you move around (mobility aids) if they are needed. These include:  Canes.  Walkers.  Scooters.  Crutches.  Turn on the lights when you go into a dark area. Replace any light bulbs as soon as they burn out.  Set up your furniture so you have a clear path. Avoid moving your furniture around.  If any of your floors are uneven, fix them.  If there are any pets around you, be aware of where they are.  Review your medicines with your doctor. Some medicines can make you feel dizzy. This can increase your chance of falling. Ask your doctor what other things that you can do to help prevent falls. This information is not intended to replace advice given to you by your health care provider. Make sure you discuss any questions you have with your health care provider. Document Released: 06/08/2009 Document Revised: 01/18/2016 Document Reviewed: 09/16/2014 Elsevier Interactive Patient Education  2017 [employer].

## 2020-04-05 NOTE — Progress Notes (Signed)
PCP notes:  Health Maintenance: Colonoscopy- due, Patient states provider has ordered referral and she will schedule this when she gets someone to accompany her  Dexa- due   Abnormal Screenings: none   Patient concerns: Pressure in her head onset 2-3 months   Nurse concerns: none   Next PCP appt.: 04/12/2020 @ 9:30 am

## 2020-04-06 ENCOUNTER — Encounter: Payer: Self-pay | Admitting: Family Medicine

## 2020-04-12 ENCOUNTER — Ambulatory Visit (INDEPENDENT_AMBULATORY_CARE_PROVIDER_SITE_OTHER): Payer: Medicare HMO | Admitting: Family Medicine

## 2020-04-12 ENCOUNTER — Encounter: Payer: Self-pay | Admitting: Family Medicine

## 2020-04-12 ENCOUNTER — Other Ambulatory Visit: Payer: Self-pay

## 2020-04-12 VITALS — BP 138/84 | HR 82 | Temp 97.9°F | Ht 62.25 in | Wt 163.0 lb

## 2020-04-12 DIAGNOSIS — Z1211 Encounter for screening for malignant neoplasm of colon: Secondary | ICD-10-CM | POA: Diagnosis not present

## 2020-04-12 DIAGNOSIS — E871 Hypo-osmolality and hyponatremia: Secondary | ICD-10-CM | POA: Diagnosis not present

## 2020-04-12 DIAGNOSIS — E2839 Other primary ovarian failure: Secondary | ICD-10-CM

## 2020-04-12 DIAGNOSIS — E559 Vitamin D deficiency, unspecified: Secondary | ICD-10-CM

## 2020-04-12 DIAGNOSIS — I10 Essential (primary) hypertension: Secondary | ICD-10-CM

## 2020-04-12 DIAGNOSIS — Z0000 Encounter for general adult medical examination without abnormal findings: Secondary | ICD-10-CM | POA: Diagnosis not present

## 2020-04-12 DIAGNOSIS — M8580 Other specified disorders of bone density and structure, unspecified site: Secondary | ICD-10-CM

## 2020-04-12 DIAGNOSIS — E7800 Pure hypercholesterolemia, unspecified: Secondary | ICD-10-CM | POA: Diagnosis not present

## 2020-04-12 DIAGNOSIS — R7303 Prediabetes: Secondary | ICD-10-CM

## 2020-04-12 MED ORDER — METOPROLOL SUCCINATE ER 25 MG PO TB24
25.0000 mg | ORAL_TABLET | Freq: Every day | ORAL | 3 refills | Status: DC
Start: 2020-04-12 — End: 2020-12-07

## 2020-04-12 MED ORDER — OMEPRAZOLE 20 MG PO CPDR: 20.0000 mg | DELAYED_RELEASE_CAPSULE | Freq: Two times a day (BID) | ORAL | 3 refills | Status: DC

## 2020-04-12 MED ORDER — SIMVASTATIN 20 MG PO TABS: 20.0000 mg | ORAL_TABLET | Freq: Every day | ORAL | 3 refills | Status: DC

## 2020-04-12 MED ORDER — LISINOPRIL 20 MG PO TABS
20.0000 mg | ORAL_TABLET | Freq: Every day | ORAL | 3 refills | Status: DC
Start: 2020-04-12 — End: 2020-12-07

## 2020-04-12 NOTE — Assessment & Plan Note (Signed)
Need to hold hctz due to hyponatremia  Continue lisinopril Pt wants to try metoprolol xl 25 mg again  Will report back if side effects F/u in 2 months

## 2020-04-12 NOTE — Patient Instructions (Addendum)
As soon as you know you can get transportation - let us know so we can schedule a coloscopy  If you cannot - let us know so we can sign you up for the cologuard program   Schedule your bone density test    Get your shingrix vaccine at the drug store  Try to find an indoor exercise alternative   Change your lisinopril hct to plain lisinopril  Try the metoprolol again   Follow up in about 2 months

## 2020-04-12 NOTE — Assessment & Plan Note (Signed)
Pt wants to do a colonoscopy- has to call when she has a driver  If not-interested in cologuard --will call and let us know

## 2020-04-12 NOTE — Assessment & Plan Note (Signed)
Reviewed health habits including diet and exercise and skin cancer prevention Reviewed appropriate screening tests for age  Also reviewed health mt list, fam hx and immunization status , as well as social and family history   See HPI Labs reviewed  dexa ordered  Will call when ready to schedule colonoscopy (needs a driver) or do cologuard if unable Plans to get shingrix vaccine at the pharmacy covid vaccinated Planning flu shot in the fall

## 2020-04-12 NOTE — Assessment & Plan Note (Signed)
Good level at 64 Disc imp for bone and overall health

## 2020-04-12 NOTE — Progress Notes (Signed)
Subjective:    Patient ID: Adriana Marks, female    DOB: [birth year], 74 y.o.   MRN: [id number]  This visit occurred during the SARS-CoV-2 public health emergency.  Safety protocols were in place, including screening questions prior to the visit, additional usage of staff PPE, and extensive cleaning of exam room while observing appropriate contact time as indicated for disinfecting solutions.    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)  She may have to leave due to health risks also   She had amw on 8/11 Needs to schedule colonoscopy (but she has scheduling issues because she needs someone to drive her)  Last time -no polyps   She has fair coverage for shingrix at the pharmacy   Due for dexa    Hearing Screening   125Hz  250Hz  500Hz  1000Hz  2000Hz  3000Hz  4000Hz  6000Hz  8000Hz   Right ear:   40 40 40  40    Left ear:   40 40 40  40      Visual Acuity Screening   Right eye Left eye Both eyes  Without correction:     With correction: 20 15 20 15 20 13      Mammogram 7/20, next one is scheduled for 04/26/20 Self breast exam-no lumps   Will get a flu vaccine in the fall  Is covid vaccinated   dexa 5/18-osteopenia  Falls-none Fractures-none Supplements takes Vit D level is 62 Exercise - usually walks 30 minutes daily    HTN bp is stable today  No cp or palpitations or headaches or edema  No side effects to medicines  BP Readings from Last 3 Encounters:  04/12/20 138/84  01/28/20 (!) 148/82  10/29/19 138/80     Pulse Readings from Last 3 Encounters:  04/12/20 82  01/28/20 71  10/29/19 76   Lab Results  Component Value Date    CREATININE 0.72 04/05/2020   BUN 9 04/05/2020   NA 130 (L) 04/05/2020   K 4.1 04/05/2020   CL 93 (L) 04/05/2020   CO2 26 04/05/2020  is on hctz    Lab Results  Component Value Date   ALT 14 04/05/2020   AST 19 04/05/2020   ALKPHOS 68 04/05/2020   BILITOT 0.7 04/05/2020    Hyperlipidemia Lab Results  Component Value Date   CHOL 154 04/05/2020   CHOL 145 03/30/2019   CHOL 158 11/14/2017   Lab Results  Component Value Date   HDL 60.10 04/05/2020   HDL 55.40 03/30/2019   HDL 61.50 11/14/2017   Lab Results  Component Value Date   LDLCALC 80 04/05/2020   LDLCALC 73 03/30/2019   LDLCALC 86 11/14/2017   Lab Results  Component Value Date   TRIG 72.0 04/05/2020   TRIG 84.0 03/30/2019   TRIG 52.0 11/14/2017   Lab Results  Component Value Date   CHOLHDL 3 04/05/2020   CHOLHDL 3 03/30/2019   CHOLHDL 3 11/14/2017   No results found for: LDLDIRECT Simvastatin and diet   Prediabetes Lab Results  Component Value Date   HGBA1C 5.5 04/05/2020  last time 5.6  Stable  Eats too much ice cream   Lab Results  Component Value Date   WBC 5.9 04/05/2020   HGB 13.6 04/05/2020   HCT 39.4 04/05/2020   MCV 90.7 04/05/2020   PLT 310.0 04/05/2020   Lab Results  Component Value Date   TSH 4.25 04/05/2020    Patient Active Problem List   Diagnosis Date Noted  . Hyponatremia 04/12/2020  . Colon cancer screening 10/29/2019  . Stress reaction 08/31/2019  . Dysuria 03/27/2018  . Breast cancer screening by mammogram 12/08/2017  . Estrogen deficiency 12/04/2016  . Infiltrate of lung present on imaging of chest 08/09/2016  . Grief reaction 09/09/2014  . Encounter for Medicare annual wellness exam 09/06/2013  . 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 Prediabetes 08/12/2012  . Other screening mammogram 03/22/2011  . Hyperlipidemia 01/01/2011  . Hypertension 01/01/2011  . GERD (gastroesophageal reflux disease) 01/01/2011  . Routine general medical examination at a health care  facility 01/01/2011  . Vitamin D deficiency disease 01/01/2011  . Osteopenia 01/01/2011  . Sleep apnea 01/01/2011  . Anxiety 01/01/2011  . Allergic rhinitis 01/01/2011  . Rosacea 01/01/2011   Past Medical History:  Diagnosis Date  . Allergy   . Bronchitis   . Colon polyps   . Depression   . GERD (gastroesophageal reflux disease)   . Heart murmur   . History of chicken pox   . Hx: UTI (urinary tract infection)   . Hyperlipidemia   . Hypertension   . Migraines   . Rosacea   . Sleep apnea    uses cpap every night    Past Surgical History:  Procedure Laterality Date  . DNC    . FOOT SURGERY     Social History   Tobacco Use  . Smoking status: Former Games developermoker  . Smokeless tobacco: Never Used  Vaping Use  . Vaping Use: Never used  Substance Use Topics  . Alcohol use: Yes    Alcohol/week: 0.0 standard drinks    Comment: wine occasional  . Drug use: No   Family History  Problem Relation Age of Onset  . Heart disease Father   . Hypertension Father   . Diabetes Other   . Heart disease Mother   . Stroke Paternal Grandmother   . Heart disease Paternal Grandfather   . Hypertension Paternal Grandfather   . Breast cancer Paternal Aunt   . Breast cancer Sister   . Cancer Sister   . Bladder Cancer Neg Hx   . Kidney cancer Neg Hx    Allergies  Allergen Reactions  . Shellfish Allergy Anaphylaxis    Sob/ swelling  Sob/ swelling   . Amlodipine     headache   Current Outpatient Medications on File Prior to Visit  Medication Sig Dispense Refill  . aspirin 81 MG tablet Take 81 mg by mouth daily.      . Cholecalciferol (VITAMIN D3) 1000 UNITS CAPS Take 5,000 capsules by mouth daily.     Marland Kitchen. EPINEPHrine (EPI-PEN) 0.3 mg/0.3 mL SOAJ injection Inject 0.3 mLs (0.3 mg total) into the muscle once. For allergic reaction 1 Device 3  . loratadine (CLARITIN) 10 MG tablet Take 10 mg by mouth daily.    . metroNIDAZOLE (METROGEL) 1 % gel Apply topically 2 (two) times daily. 180 g 3  .  Naproxen Sodium (ALEVE PO) Take 1 tablet by mouth as needed.    . [DISCONTINUED] atorvastatin (LIPITOR) 10 MG tablet Take 1 tablet (10 mg total)  by mouth daily. 30 tablet 11   No current facility-administered medications on file prior to visit.     Review of Systems  Constitutional: Negative for activity change, appetite change, fatigue, fever and unexpected weight change.  HENT: Negative for congestion, ear pain, rhinorrhea, sinus pressure and sore throat.   Eyes: Negative for pain, redness and visual disturbance.  Respiratory: Negative for cough, shortness of breath and wheezing.   Cardiovascular: Negative for chest pain and palpitations.  Gastrointestinal: Negative for abdominal pain, blood in stool, constipation and diarrhea.  Endocrine: Negative for polydipsia and polyuria.  Genitourinary: Negative for dysuria, frequency and urgency.  Musculoskeletal: Negative for arthralgias, back pain and myalgias.  Skin: Negative for pallor and rash.  Allergic/Immunologic: Negative for environmental allergies.  Neurological: Negative for dizziness, syncope and headaches.  Hematological: Negative for adenopathy. Does not bruise/bleed easily.  Psychiatric/Behavioral: Negative for decreased concentration and dysphoric mood. The patient is not nervous/anxious.        Objective:   Physical Exam Constitutional:      General: She is not in acute distress.    Appearance: Normal appearance. She is well-developed and normal weight. She is not ill-appearing or diaphoretic.  HENT:     Head: Normocephalic and atraumatic.     Right Ear: Tympanic membrane, ear canal and external ear normal.     Left Ear: Tympanic membrane, ear canal and external ear normal.     Nose: Nose normal. No congestion.     Mouth/Throat:     Mouth: Mucous membranes are moist.     Pharynx: Oropharynx is clear. No posterior oropharyngeal erythema.  Eyes:     General: No scleral icterus.    Extraocular Movements: Extraocular  movements intact.     Conjunctiva/sclera: Conjunctivae normal.     Pupils: Pupils are equal, round, and reactive to light.  Neck:     Thyroid: No thyromegaly.     Vascular: No carotid bruit or JVD.  Cardiovascular:     Rate and Rhythm: Normal rate and regular rhythm.     Pulses: Normal pulses.     Heart sounds: Normal heart sounds. No gallop.   Pulmonary:     Effort: Pulmonary effort is normal. No respiratory distress.     Breath sounds: Normal breath sounds. No wheezing.     Comments: Good air exch Chest:     Chest wall: No tenderness.  Abdominal:     General: Bowel sounds are normal. There is no distension or abdominal bruit.     Palpations: Abdomen is soft. There is no mass.     Tenderness: There is no abdominal tenderness.     Hernia: No hernia is present.  Genitourinary:    Comments: Breast exam: No mass, nodules, thickening, tenderness, bulging, retraction, inflamation, nipple discharge or skin changes noted.  No axillary or clavicular LA.     Musculoskeletal:        General: No tenderness. Normal range of motion.     Cervical back: Normal range of motion and neck supple. No rigidity. No muscular tenderness.     Right lower leg: No edema.     Left lower leg: No edema.  Lymphadenopathy:     Cervical: No cervical adenopathy.  Skin:    General: Skin is warm and dry.     Coloration: Skin is not pale.     Findings: No erythema or rash.     Comments: Solar lentigines diffusely Some sks  Fair complexion  Neurological:     Mental  Status: She is alert. Mental status is at baseline.     Cranial Nerves: No cranial nerve deficit.     Motor: No abnormal muscle tone.     Coordination: Coordination normal.     Gait: Gait normal.     Deep Tendon Reflexes: Reflexes are normal and symmetric. Reflexes normal.  Psychiatric:        Mood and Affect: Mood normal.        Cognition and Memory: Cognition and memory normal.           Assessment & Plan:   Problem List Items  Addressed This Visit      Cardiovascular and Mediastinum   Hypertension    Need to hold hctz due to hyponatremia  Continue lisinopril Pt wants to try metoprolol xl 25 mg again  Will report back if side effects F/u in 2 months      Relevant Medications   lisinopril (ZESTRIL) 20 MG tablet   metoprolol succinate (TOPROL-XL) 25 MG 24 hr tablet   simvastatin (ZOCOR) 20 MG tablet     Musculoskeletal and Integument   Osteopenia    dexa ordered Pt will call to schedule  No falls or fractures  Good D level at 64 Encouraged her to keep walking        Other   Hyperlipidemia    Lipids are fairly controlled with simvastatin and diet  Disc goals for lipids and reasons to control them Rev last labs with pt Rev low sat fat diet in detail       Relevant Medications   lisinopril (ZESTRIL) 20 MG tablet   metoprolol succinate (TOPROL-XL) 25 MG 24 hr tablet   simvastatin (ZOCOR) 20 MG tablet   Routine general medical examination at a health care facility - Primary    Reviewed health habits including diet and exercise and skin cancer prevention Reviewed appropriate screening tests for age  Also reviewed health mt list, fam hx and immunization status , as well as social and family history   See HPI Labs reviewed  dexa ordered  Will call when ready to schedule colonoscopy (needs a driver) or do cologuard if unable Plans to get shingrix vaccine at the pharmacy covid vaccinated Planning flu shot in the fall        Vitamin D deficiency disease    Good level at 64 Disc imp for bone and overall health      Prediabetes    Lab Results  Component Value Date   HGBA1C 5.5 04/05/2020   disc imp of low glycemic diet and wt loss to prevent DM2       Estrogen deficiency   Relevant Orders   DG Bone Density   Colon cancer screening    Pt wants to do a colonoscopy- has to call when she has a driver  If not-interested in cologuard --will call and let us know       Hyponatremia    Na  of 130  No symptoms  Will hold her hctz  F/u 2 mo for HTN visit and repeat lab

## 2020-04-12 NOTE — Assessment & Plan Note (Signed)
Lab Results  Component Value Date   HGBA1C 5.5 04/05/2020   disc imp of low glycemic diet and wt loss to prevent DM2

## 2020-04-12 NOTE — Assessment & Plan Note (Signed)
Lipids are fairly controlled with simvastatin and diet  Disc goals for lipids and reasons to control them Rev last labs with pt Rev low sat fat diet in detail

## 2020-04-12 NOTE — Assessment & Plan Note (Signed)
dexa ordered Pt will call to schedule  No falls or fractures  Good D level at 64 Encouraged her to keep walking

## 2020-04-12 NOTE — Assessment & Plan Note (Signed)
Na of 130  No symptoms  Will hold her hctz  F/u 2 mo for HTN visit and repeat lab

## 2020-04-26 ENCOUNTER — Ambulatory Visit
Admission: RE | Admit: 2020-04-26 | Discharge: 2020-04-26 | Disposition: A | Discharge status: Home / Self Care | Payer: Medicare HMO | Source: Ambulatory Visit | Attending: Family Medicine | Admitting: Family Medicine

## 2020-04-26 ENCOUNTER — Other Ambulatory Visit: Payer: Self-pay

## 2020-04-26 DIAGNOSIS — Z1231 Encounter for screening mammogram for malignant neoplasm of breast: Secondary | ICD-10-CM

## 2020-05-02 DIAGNOSIS — R69 Illness, unspecified: Secondary | ICD-10-CM | POA: Diagnosis not present

## 2020-05-05 ENCOUNTER — Other Ambulatory Visit: Payer: Medicare HMO

## 2020-05-06 ENCOUNTER — Encounter: Payer: Self-pay | Admitting: Family Medicine

## 2020-05-08 ENCOUNTER — Other Ambulatory Visit: Payer: Self-pay

## 2020-05-08 ENCOUNTER — Other Ambulatory Visit: Payer: Medicare HMO

## 2020-05-08 DIAGNOSIS — Z20822 Contact with and (suspected) exposure to covid-19: Secondary | ICD-10-CM

## 2020-05-08 NOTE — Telephone Encounter (Signed)
I left a detailed message on patient's voice mail to call back and schedule virtual visit for sinus symptoms.

## 2020-05-09 ENCOUNTER — Telehealth (INDEPENDENT_AMBULATORY_CARE_PROVIDER_SITE_OTHER): Payer: Medicare HMO | Admitting: Family Medicine

## 2020-05-09 ENCOUNTER — Other Ambulatory Visit: Payer: Self-pay

## 2020-05-09 ENCOUNTER — Encounter: Payer: Self-pay | Admitting: Family Medicine

## 2020-05-09 DIAGNOSIS — J069 Acute upper respiratory infection, unspecified: Secondary | ICD-10-CM | POA: Diagnosis not present

## 2020-05-09 DIAGNOSIS — I10 Essential (primary) hypertension: Secondary | ICD-10-CM | POA: Diagnosis not present

## 2020-05-09 LAB — SARS-COV-2, NAA 2 DAY TAT

## 2020-05-09 LAB — NOVEL CORONAVIRUS, NAA: SARS-CoV-2, NAA: NOT DETECTED

## 2020-05-09 NOTE — Patient Instructions (Addendum)
Please call us when you get your covid test results  Isolate yourself for now   Continue treating symptoms  We will watch for worse cough or wheezing Also for sinus pain or colored nasal mucous  Keep checking pulse ox and temperature   For ankle swelling-elevate feet when you sit  Support socks may help if you want to try them

## 2020-05-09 NOTE — Assessment & Plan Note (Signed)
We changed lisinopril hct to plain lisinopril due to hyponatremia She c/o mild ankle edema (no other symptoms) Adv to elevate feet/try supp hose if needed and alert if worse or other new symptoms

## 2020-05-09 NOTE — Assessment & Plan Note (Signed)
Mild symptoms Tested for covid yesterday-still pending result  (she will call or email [place])  Isolate for now Fluids/rest/sympt care If wheezing-may need inhaler or prednisone  Add back flonase  Watch for fever /new symptoms  Watch for sinus pain  Update if not starting to improve in a week or if worsening

## 2020-05-09 NOTE — Progress Notes (Addendum)
Virtual Visit via Video Note  I connected with Adriana Marks on 05/09/20 at 10:00 AM EDT by a video enabled telemedicine application and verified that I am speaking with the correct person using two identifiers.  Location: Patient: home Provider: office   I discussed the limitations of evaluation and management by telemedicine and the availability of in person appointments. The patient expressed understanding and agreed to proceed.  Parties involved in encounter  Patient: Adriana Marks   Video worked well- then lost audio later in visit so we finished with a phone call   Provider:  Roxy Manns MD    History of Present Illness: Pt presents for sinus symptoms   For about 2 weeks  Started with drainage from both ears  Then started a productive cough -- phlegm is a bit yellow in the am and then it turns clear  A wheeze here and then (when a lot of mucous) Not sob  Her pulse ox is always over 94%  (today 97%)  She does have seasonal allergies   Not a lot of head congested/ a little runny  Some burning of eyes / they feel tired  No sinus pain/ but has some pressure   Some mild headache in am - then it goes away quickly  No ST at all   No fever  A little tired  No aches or chills   No change in taste or smell   No GI symptoms at all    She had a covid test yesterday  Will get result likely in 48 h   Has never had to use inhaler   She is covid immunized-pfizer march   Had to stop hctz for hyponatremia Notes more edema  BP Readings from Last 3 Encounters:  05/09/20 121/74  04/12/20 138/84  01/28/20 (!) 148/82   Over the counter :  Nasal saline spray  Has flonase -has not used  Has not needed cough medicine  She has taken aleve occasionally for headache  Drinking lots of water and ginger ale   No known exposures to any illness  Patient Active Problem List   Diagnosis Date Noted  . Viral URI with cough 05/09/2020  . Hyponatremia 04/12/2020  . Colon  cancer screening 10/29/2019  . Stress reaction 08/31/2019  . Dysuria 03/27/2018  . Breast cancer screening by mammogram 12/08/2017  . Estrogen deficiency 12/04/2016  . Infiltrate of lung present on imaging of chest 08/09/2016  . Grief reaction 09/09/2014  . Encounter for Medicare annual wellness exam 09/06/2013  . 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 Prediabetes 08/12/2012  . Other screening mammogram 03/22/2011  . Hyperlipidemia 01/01/2011  . Hypertension 01/01/2011  . GERD (gastroesophageal reflux disease) 01/01/2011  . Routine general medical examination at a health care facility 01/01/2011  . Vitamin D deficiency disease 01/01/2011  . Osteopenia 01/01/2011  . Sleep apnea 01/01/2011  . Anxiety 01/01/2011  . Allergic rhinitis 01/01/2011  . Rosacea 01/01/2011   Past Medical History:  Diagnosis Date  . Allergy   . Bronchitis   . Colon polyps   . Depression   . GERD (gastroesophageal reflux disease)   . Heart murmur   . History of chicken pox   . Hx: UTI (urinary tract infection)   . Hyperlipidemia   . Hypertension   . Migraines   . Rosacea   . Sleep apnea    uses cpap every night    Past Surgical History:  Procedure Laterality Date  . DNC    .  FOOT SURGERY     Social History   Tobacco Use  . Smoking status: Former Games developer  . Smokeless tobacco: Never Used  Vaping Use  . Vaping Use: Never used  Substance Use Topics  . Alcohol use: Yes    Alcohol/week: 0.0 standard drinks    Comment: wine occasional  . Drug use: No   Family History  Problem Relation Age of Onset  . Heart disease Father   . Hypertension Father   . Diabetes Other   . Heart disease Mother   . Stroke Paternal Grandmother   . Heart disease Paternal Grandfather   . Hypertension Paternal Grandfather   . Breast cancer Paternal Aunt   . Breast cancer Sister   . Cancer Sister   . Bladder Cancer Neg Hx   . Kidney cancer Neg Hx    Allergies  Allergen Reactions  . Shellfish Allergy Anaphylaxis     Sob/ swelling  Sob/ swelling   . Amlodipine     headache   Current Outpatient Medications on File Prior to Visit  Medication Sig Dispense Refill  . aspirin 81 MG tablet Take 81 mg by mouth daily.      . Cholecalciferol (VITAMIN D3) 1000 UNITS CAPS Take 5,000 capsules by mouth daily.     Marland Kitchen EPINEPHrine (EPI-PEN) 0.3 mg/0.3 mL SOAJ injection Inject 0.3 mLs (0.3 mg total) into the muscle once. For allergic reaction 1 Device 3  . lisinopril (ZESTRIL) 20 MG tablet Take 1 tablet (20 mg total) by mouth daily. 90 tablet 3  . loratadine (CLARITIN) 10 MG tablet Take 10 mg by mouth daily.    . metoprolol succinate (TOPROL-XL) 25 MG 24 hr tablet Take 1 tablet (25 mg total) by mouth daily. 90 tablet 3  . metroNIDAZOLE (METROGEL) 1 % gel Apply topically 2 (two) times daily. 180 g 3  . Naproxen Sodium (ALEVE PO) Take 1 tablet by mouth as needed.    Marland Kitchen omeprazole (PRILOSEC) 20 MG capsule Take 1 capsule (20 mg total) by mouth 2 (two) times daily. 180 capsule 3  . simvastatin (ZOCOR) 20 MG tablet Take 1 tablet (20 mg total) by mouth daily at 6 PM. 90 tablet 3  . [DISCONTINUED] atorvastatin (LIPITOR) 10 MG tablet Take 1 tablet (10 mg total) by mouth daily. 30 tablet 11   No current facility-administered medications on file prior to visit.   Review of Systems  Constitutional: Negative for chills, fever and malaise/fatigue.       A little fatigued  HENT: Negative for congestion, ear pain, sinus pain and sore throat.        Some sinus pressure but not pain    Eyes: Negative for blurred vision, discharge and redness.  Respiratory: Positive for cough, sputum production and wheezing. Negative for shortness of breath and stridor.   Cardiovascular: Negative for chest pain, palpitations and leg swelling.  Gastrointestinal: Negative for abdominal pain, diarrhea, nausea and vomiting.  Musculoskeletal: Negative for myalgias.  Skin: Negative for rash.  Neurological: Positive for headaches. Negative for dizziness.       Observations/Objective: Patient appears well, in no distress Weight is baseline  No facial swelling or asymmetry Normal voice-not hoarse and no slurred speech No obvious tremor or mobility impairment Moving neck and UEs normally Able to hear the call well  No cough or shortness of breath during interview (she does clear throat occasionally) Talkative and mentally sharp with no cognitive changes No skin changes on face or neck , no rash or pallor  Affect is normal    Assessment and Plan: Problem List Items Addressed This Visit      Cardiovascular and Mediastinum   Hypertension    We changed lisinopril hct to plain lisinopril due to hyponatremia She c/o mild ankle edema (no other symptoms) Adv to elevate feet/try supp hose if needed and alert if worse or other new symptoms        Respiratory   Viral URI with cough    Mild symptoms Tested for covid yesterday-still pending result  (she will call or email [place])  Isolate for now Fluids/rest/sympt care If wheezing-may need inhaler or prednisone  Add back flonase  Watch for fever /new symptoms  Watch for sinus pain  Update if not starting to improve in a week or if worsening            Follow Up Instructions: Please call us when you get your covid test results  Isolate yourself for now   Continue treating symptoms  We will watch for worse cough or wheezing Also for sinus pain or colored nasal mucous  Keep checking pulse ox and temperature   For ankle swelling-elevate feet when you sit  Support socks may help if you want to try them    I discussed the assessment and treatment plan with the patient. The patient was provided an opportunity to ask questions and all were answered. The patient agreed with the plan and demonstrated an understanding of the instructions.   The patient was advised to call back or seek an in-person evaluation if the symptoms worsen or if the condition fails to improve as  anticipated.     Roxy Manns, MD

## 2020-05-12 ENCOUNTER — Telehealth: Payer: Self-pay

## 2020-05-12 MED ORDER — AMOXICILLIN-POT CLAVULANATE 875-125 MG PO TABS
1.0000 | ORAL_TABLET | Freq: Two times a day (BID) | ORAL | 0 refills | Status: DC
Start: 2020-05-12 — End: 2020-06-12

## 2020-05-12 NOTE — Telephone Encounter (Signed)
I pended an augmentin px to cover sinus infection  She has multiple pharmacies listed-please send to pharmacy of choice  Glad the test was negative  Let us know if worse or no improvement

## 2020-05-12 NOTE — Telephone Encounter (Signed)
Called patient reviewed information and verified pharmacy. She will let is know if any issues.

## 2020-05-12 NOTE — Telephone Encounter (Signed)
Pt left a message on triage line stating her Covid test came back negative. She is still have URI symptoms and is more congested in her head. This has been going on for 3 weeks. Would like an antibiotic called in. Please advise.

## 2020-05-23 ENCOUNTER — Telehealth: Payer: Self-pay | Admitting: Family Medicine

## 2020-05-23 DIAGNOSIS — E2839 Other primary ovarian failure: Secondary | ICD-10-CM

## 2020-05-23 DIAGNOSIS — Z1211 Encounter for screening for malignant neoplasm of colon: Secondary | ICD-10-CM

## 2020-05-23 NOTE — Telephone Encounter (Signed)
Pt is requesting a referral for colonoscopy and a bone density for this year.  Thank you!

## 2020-05-29 ENCOUNTER — Other Ambulatory Visit: Payer: Self-pay

## 2020-05-29 ENCOUNTER — Telehealth: Payer: Self-pay

## 2020-05-29 DIAGNOSIS — Z1211 Encounter for screening for malignant neoplasm of colon: Secondary | ICD-10-CM

## 2020-05-29 MED ORDER — NA SULFATE-K SULFATE-MG SULF 17.5-3.13-1.6 GM/177ML PO SOLN: 354.0000 mL | Freq: Once | ORAL | 0 refills | Status: AC

## 2020-05-29 NOTE — Telephone Encounter (Signed)
Gastroenterology Pre-Procedure Review  Request Date: 08/16/2020 Requesting Physician: Dr. Allegra Lai  PATIENT REVIEW QUESTIONS: The patient responded to the following health history questions as indicated:    1. Are you having any GI issues? no 2. Do you have a personal history of Polyps?yes 15 years ago had 1 polyp removed  3. Do you have a family history of Colon Cancer or Polyps? no  4. Diabetes Mellitus? no 5. Joint replacements in the past 12 months?no 6. Major health problems in the past 3 months?no 7. Any artificial heart valves, MVP, or defibrillator?no    MEDICATIONS & ALLERGIES:    Patient reports the following regarding taking any anticoagulation/antiplatelet therapy:   Plavix, Coumadin, Eliquis, Xarelto, Lovenox, Pradaxa, Brilinta, or Effient? no Aspirin? Yes Asprin 81mg    Patient confirms/reports the following medications:  Current Outpatient Medications  Medication Sig Dispense Refill  . amoxicillin-clavulanate (AUGMENTIN) 875-125 MG tablet Take 1 tablet by mouth 2 (two) times daily. 14 tablet 0  . aspirin 81 MG tablet Take 81 mg by mouth daily.      . Cholecalciferol (VITAMIN D3) 1000 UNITS CAPS Take 5,000 capsules by mouth daily.     EPINEPHrine (EPI-PEN) 0.3 mg/0.3 mL SOAJ injection Inject 0.3 mLs (0.3 mg total) into the muscle once. For allergic reaction 1 Device 3  . lisinopril (ZESTRIL) 20 MG tablet Take 1 tablet (20 mg total) by mouth daily. 90 tablet 3  . loratadine (CLARITIN) 10 MG tablet Take 10 mg by mouth daily.    . metoprolol succinate (TOPROL-XL) 25 MG 24 hr tablet Take 1 tablet (25 mg total) by mouth daily. 90 tablet 3  . metroNIDAZOLE (METROGEL) 1 % gel Apply topically 2 (two) times daily. 180 g 3  . Naproxen Sodium (ALEVE PO) Take 1 tablet by mouth as needed.    Marland Kitchen omeprazole (PRILOSEC) 20 MG capsule Take 1 capsule (20 mg total) by mouth 2 (two) times daily. 180 capsule 3  . simvastatin (ZOCOR) 20 MG tablet Take 1 tablet (20 mg total) by mouth daily at 6  PM. 90 tablet 3   No current facility-administered medications for this visit.    Patient confirms/reports the following allergies:  Allergies  Allergen Reactions  . Shellfish Allergy Anaphylaxis    Sob/ swelling  Sob/ swelling   . Amlodipine     headache    No orders of the defined types were placed in this encounter.   AUTHORIZATION INFORMATION Primary Insurance: 1D#: Group #:  Secondary Insurance: 1D#: Group #:  SCHEDULE INFORMATION: Date:  Time: Location:

## 2020-06-05 ENCOUNTER — Other Ambulatory Visit: Payer: Self-pay | Admitting: Family Medicine

## 2020-06-07 DIAGNOSIS — R69 Illness, unspecified: Secondary | ICD-10-CM | POA: Diagnosis not present

## 2020-06-12 ENCOUNTER — Ambulatory Visit (INDEPENDENT_AMBULATORY_CARE_PROVIDER_SITE_OTHER): Payer: Medicare HMO | Admitting: Family Medicine

## 2020-06-12 ENCOUNTER — Other Ambulatory Visit: Payer: Self-pay

## 2020-06-12 ENCOUNTER — Encounter: Payer: Self-pay | Admitting: Family Medicine

## 2020-06-12 VITALS — BP 134/82 | HR 73 | Temp 97.0°F | Ht 62.25 in | Wt 163.1 lb

## 2020-06-12 DIAGNOSIS — E871 Hypo-osmolality and hyponatremia: Secondary | ICD-10-CM | POA: Diagnosis not present

## 2020-06-12 DIAGNOSIS — I10 Essential (primary) hypertension: Secondary | ICD-10-CM

## 2020-06-12 LAB — BASIC METABOLIC PANEL
BUN: 10 mg/dL (ref 6–23)
CO2: 29 mEq/L (ref 19–32)
Calcium: 9.6 mg/dL (ref 8.4–10.5)
Chloride: 102 mEq/L (ref 96–112)
Creatinine, Ser: 0.7 mg/dL (ref 0.40–1.20)
GFR: 86.26 mL/min (ref >60.00)
Glucose, Bld: 87 mg/dL (ref 70–99)
Potassium: 4.4 mEq/L (ref 3.5–5.1)
Sodium: 137 mEq/L (ref 135–145)

## 2020-06-12 NOTE — Patient Instructions (Signed)
Stay on current medicines  Take care of yourself  Eat healthy   Exercise-/stay active  Keep walking

## 2020-06-12 NOTE — Progress Notes (Signed)
Subjective:    Patient ID: Adriana Marks, female    DOB: [birth year], 74 y.o.   MRN: [id number]  This visit occurred during the SARS-CoV-2 public health emergency.  Safety protocols were in place, including screening questions prior to the visit, additional usage of staff PPE, and extensive cleaning of exam room while observing appropriate contact time as indicated for disinfecting solutions.    HPI Pt f/u for medication management /chronic health problems   Wt Readings from Last 3 Encounters:  06/12/20 163 lb 1.6 oz (74 kg)  05/09/20 163 lb (73.9 kg)  04/12/20 163 lb (73.9 kg)   29.59 kg/m   HTN Last visit held hctz due to hyponatremia  Continued lisinopril 20 mg daily  Added metoprolol xl 25 mg daily   BP Readings from Last 3 Encounters:  06/12/20 134/82  05/09/20 121/74  04/12/20 138/84   Pulse Readings from Last 3 Encounters:  06/12/20 73  05/09/20 63  04/12/20 82   Feels good   She needs a new bp cuff -hers is old and un calibrated   Patient Active Problem List   Diagnosis Date Noted  . Viral URI with cough 05/09/2020  . Hyponatremia 04/12/2020  . Colon cancer screening 10/29/2019  . Stress reaction 08/31/2019  . Dysuria 03/27/2018  . Breast cancer screening by mammogram 12/08/2017  . Estrogen deficiency 12/04/2016  . Infiltrate of lung present on imaging of chest 08/09/2016  . Grief reaction 09/09/2014  . Encounter for Medicare annual wellness exam 09/06/2013  . 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 Prediabetes 08/12/2012  . Other screening mammogram 03/22/2011  . Hyperlipidemia 01/01/2011  . Hypertension 01/01/2011  . GERD (gastroesophageal reflux disease) 01/01/2011  . Routine general medical examination at a health care facility 01/01/2011  . Vitamin D deficiency disease 01/01/2011  . Osteopenia 01/01/2011  . Sleep apnea 01/01/2011  . Anxiety 01/01/2011  . Allergic rhinitis 01/01/2011  . Rosacea 01/01/2011   Past Medical History:  Diagnosis Date   . Allergy   . Bronchitis   . Colon polyps   . Depression   . GERD (gastroesophageal reflux disease)   . Heart murmur   . History of chicken pox   . Hx: UTI (urinary tract infection)   . Hyperlipidemia   . Hypertension   . Migraines   . Rosacea   . Sleep apnea    uses cpap every night    Past Surgical History:  Procedure Laterality Date  . DNC    . FOOT SURGERY     Social History   Tobacco Use  . Smoking status: Former Games developer  . Smokeless tobacco: Never Used  Vaping Use  . Vaping Use: Never used  Substance Use Topics  . Alcohol use: Yes    Alcohol/week: 0.0 standard drinks    Comment: wine occasional  . Drug use: No   Family History  Problem Relation Age of Onset  . Heart disease Father   . Hypertension Father   . Diabetes Other   . Heart disease Mother   . Stroke Paternal Grandmother   . Heart disease Paternal Grandfather   . Hypertension Paternal Grandfather   . Breast cancer Paternal Aunt   . Breast cancer Sister   . Cancer Sister   . Bladder Cancer Neg Hx   . Kidney cancer Neg Hx    Allergies  Allergen Reactions  . Shellfish Allergy Anaphylaxis    Sob/ swelling  Sob/ swelling   . Amlodipine     headache  Current Outpatient Medications on File Prior to Visit  Medication Sig Dispense Refill  . aspirin 81 MG tablet Take 81 mg by mouth daily.      . Cholecalciferol (VITAMIN D3) 1000 UNITS CAPS Take 5,000 capsules by mouth daily.     Marland Kitchen EPINEPHrine (EPI-PEN) 0.3 mg/0.3 mL SOAJ injection Inject 0.3 mLs (0.3 mg total) into the muscle once. For allergic reaction 1 Device 3  . lisinopril (ZESTRIL) 20 MG tablet Take 1 tablet (20 mg total) by mouth daily. 90 tablet 3  . loratadine (CLARITIN) 10 MG tablet Take 10 mg by mouth daily.    . metoprolol succinate (TOPROL-XL) 25 MG 24 hr tablet Take 1 tablet (25 mg total) by mouth daily. 90 tablet 3  . metroNIDAZOLE (METROGEL) 1 % gel Apply topically 2 (two) times daily. 180 g 3  . Naproxen Sodium (ALEVE PO) Take 1  tablet by mouth as needed.    Marland Kitchen omeprazole (PRILOSEC) 20 MG capsule Take 1 capsule (20 mg total) by mouth 2 (two) times daily. 180 capsule 3  . simvastatin (ZOCOR) 20 MG tablet Take 1 tablet (20 mg total) by mouth daily at 6 PM. 90 tablet 3  . [DISCONTINUED] atorvastatin (LIPITOR) 10 MG tablet Take 1 tablet (10 mg total) by mouth daily. 30 tablet 11   No current facility-administered medications on file prior to visit.     Review of Systems  Constitutional: Negative for activity change, appetite change, fatigue, fever and unexpected weight change.  HENT: Negative for congestion, ear pain, rhinorrhea, sinus pressure and sore throat.   Eyes: Negative for pain, redness and visual disturbance.  Respiratory: Negative for cough, shortness of breath and wheezing.   Cardiovascular: Negative for chest pain and palpitations.  Gastrointestinal: Negative for abdominal pain, blood in stool, constipation and diarrhea.  Endocrine: Negative for polydipsia and polyuria.  Genitourinary: Negative for dysuria, frequency and urgency.  Musculoskeletal: Negative for arthralgias, back pain and myalgias.  Skin: Negative for pallor and rash.  Allergic/Immunologic: Negative for environmental allergies.  Neurological: Negative for dizziness, syncope and headaches.  Hematological: Negative for adenopathy. Does not bruise/bleed easily.  Psychiatric/Behavioral: Negative for decreased concentration and dysphoric mood. The patient is not nervous/anxious.        Objective:   Physical Exam Constitutional:      General: She is not in acute distress.    Appearance: Normal appearance. She is well-developed. She is obese. She is not ill-appearing.  HENT:     Head: Normocephalic and atraumatic.  Eyes:     Conjunctiva/sclera: Conjunctivae normal.     Pupils: Pupils are equal, round, and reactive to light.  Neck:     Thyroid: No thyromegaly.     Vascular: No carotid bruit or JVD.  Cardiovascular:     Rate and Rhythm:  Normal rate and regular rhythm.     Heart sounds: Normal heart sounds. No gallop.   Pulmonary:     Effort: Pulmonary effort is normal. No respiratory distress.     Breath sounds: Normal breath sounds. No wheezing or rales.  Abdominal:     General: Bowel sounds are normal. There is no abdominal bruit.     Palpations: Abdomen is soft.  Musculoskeletal:     Cervical back: Normal range of motion and neck supple.     Right lower leg: No edema.     Left lower leg: No edema.  Lymphadenopathy:     Cervical: No cervical adenopathy.  Skin:    General: Skin is warm and  dry.     Findings: No rash.  Neurological:     Mental Status: She is alert.     Deep Tendon Reflexes: Reflexes are normal and symmetric.  Psychiatric:        Mood and Affect: Mood normal.           Assessment & Plan:   Problem List Items Addressed This Visit      Cardiovascular and Mediastinum   Hypertension - Primary    Last visit stopped hctz 25 due to low na level Replaced with metoprolol xl 25 mg daily  Continues lisinopril 20 mg daily   bp in fair control at this time  BP Readings from Last 1 Encounters:  06/12/20 134/82   No changes needed Most recent labs reviewed  (also ordered bmet to check na today) Disc lifstyle change with low sodium diet and exercise        Relevant Orders   Basic metabolic panel     Other   Hyponatremia    Now off hctz  Re check bmet today      Relevant Orders   Basic metabolic panel

## 2020-06-12 NOTE — Assessment & Plan Note (Signed)
Last visit stopped hctz 25 due to low na level Replaced with metoprolol xl 25 mg daily  Continues lisinopril 20 mg daily   bp in fair control at this time  BP Readings from Last 1 Encounters:  06/12/20 134/82   No changes needed Most recent labs reviewed  (also ordered bmet to check na today) Disc lifstyle change with low sodium diet and exercise

## 2020-06-12 NOTE — Assessment & Plan Note (Signed)
Now off hctz  Re check bmet today

## 2020-07-11 ENCOUNTER — Telehealth: Payer: Self-pay | Admitting: Family Medicine

## 2020-07-11 DIAGNOSIS — K219 Gastro-esophageal reflux disease without esophagitis: Secondary | ICD-10-CM

## 2020-07-11 NOTE — Telephone Encounter (Signed)
Pt called in wanted to know about getting a referral for GI due to she is having problems with heartburn and her throat.

## 2020-07-12 ENCOUNTER — Telehealth: Payer: Self-pay

## 2020-07-12 NOTE — Telephone Encounter (Signed)
Patients colonoscopy scheduled with Dr. Allegra Lai on 08/16/20 has been canceled at this time.  Traci from our [facility] has made patient an office visit due to Reflux symptoms patient is experiencing.  Office visit is 09/12/20.  Thanks,  [place], [place]

## 2020-08-16 ENCOUNTER — Ambulatory Visit: Admit: 2020-08-16 | Payer: Medicare HMO | Admitting: Gastroenterology

## 2020-08-16 SURGERY — COLONOSCOPY WITH PROPOFOL
Anesthesia: General

## 2020-09-12 ENCOUNTER — Ambulatory Visit: Payer: Medicare HMO | Admitting: Gastroenterology

## 2020-09-19 ENCOUNTER — Encounter: Payer: Self-pay | Admitting: Family Medicine

## 2020-09-26 ENCOUNTER — Ambulatory Visit: Payer: Medicare HMO | Admitting: Gastroenterology

## 2020-12-07 ENCOUNTER — Encounter: Payer: Self-pay | Admitting: Family Medicine

## 2020-12-07 ENCOUNTER — Ambulatory Visit (INDEPENDENT_AMBULATORY_CARE_PROVIDER_SITE_OTHER): Payer: Medicare HMO | Admitting: Family Medicine

## 2020-12-07 ENCOUNTER — Other Ambulatory Visit: Payer: Self-pay

## 2020-12-07 VITALS — BP 148/94 | HR 68 | Temp 97.2°F | Ht 62.25 in | Wt 169.0 lb

## 2020-12-07 DIAGNOSIS — F419 Anxiety disorder, unspecified: Secondary | ICD-10-CM | POA: Diagnosis not present

## 2020-12-07 DIAGNOSIS — R69 Illness, unspecified: Secondary | ICD-10-CM | POA: Diagnosis not present

## 2020-12-07 DIAGNOSIS — G4730 Sleep apnea, unspecified: Secondary | ICD-10-CM

## 2020-12-07 DIAGNOSIS — I10 Essential (primary) hypertension: Secondary | ICD-10-CM | POA: Diagnosis not present

## 2020-12-07 DIAGNOSIS — R519 Headache, unspecified: Secondary | ICD-10-CM | POA: Diagnosis not present

## 2020-12-07 DIAGNOSIS — G8929 Other chronic pain: Secondary | ICD-10-CM | POA: Diagnosis not present

## 2020-12-07 MED ORDER — BUSPIRONE HCL 15 MG PO TABS: 15.0000 mg | ORAL_TABLET | Freq: Two times a day (BID) | ORAL | 3 refills | Status: DC

## 2020-12-07 MED ORDER — OLMESARTAN MEDOXOMIL 40 MG PO TABS: 40.0000 mg | ORAL_TABLET | Freq: Every day | ORAL | 3 refills | Status: DC

## 2020-12-07 NOTE — Assessment & Plan Note (Signed)
Suspect fatigue and am headaches may be resulting from this Off cpap for quite some time and interested in re eval  pulm ref made

## 2020-12-07 NOTE — Assessment & Plan Note (Signed)
At times regarding health  Reviewed stressors/ coping techniques/symptoms/ support sources/ tx options and side effects in detail today Does go to women's group Cannot afford counseling  Px buspar 7.5 mg bid to start and update  Discussed expectations of this medication including time to effectiveness and mechanism of action, also poss of side effects (early and late)- including mental fuzziness, weight or appetite change, nausea and poss of worse dep or anxiety (even suicidal thoughts)  Pt voiced understanding and will stop med and update if this occurs

## 2020-12-07 NOTE — Assessment & Plan Note (Signed)
BP: (!) 148/94    Amlodipine and metoprolol worsened headaches  hctz caused hyponatremia Will change her lisinopril 20 to benicar 40 daily  Will call if side eff  F/u 6-8 wk  Good health habits Hope better bp control will help headaches

## 2020-12-07 NOTE — Assessment & Plan Note (Signed)
Severe /migraine when younger Now more mild and sometimes wakes up with  No doubt elevated bp and sleep apnea add to this  Will work on those problems  If no further imp consider neuro eval or prophylaxis

## 2020-12-07 NOTE — Progress Notes (Signed)
Subjective:    Patient ID: Adriana Marks, female    DOB: 09-11-1945, 75 y.o.   MRN: [id number]  This visit occurred during the SARS-CoV-2 public health emergency.  Safety protocols were in place, including screening questions prior to the visit, additional usage of staff PPE, and extensive cleaning of exam room while observing appropriate contact time as indicated for disinfecting solutions.    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  Left her with too much time to think  Wants to look for another job  (interested in school system-admin) part time   Walking a lot more now that she has time  Working in the yard several hours a day   Has tension headaches -thought the metoprolol would help but actually made her worse  Wakes up with headaches  Sometimes pressure and sometimes pain  Is mild but it is there  Hospital doctor to the dentist- everything was ok  Both sides of head and top    No post nasal drip  Allergies- red eyes after being outside  All mucous is clear   Took an aleve Saturday am - it upset her stomach   Had migraines in her 30s  Her bp was high then also  One cup of coffee per day  No etoh   Anxiety -comes out of nowhere  Ruminates on stressors  Makes it hard to sleep  Excessive worry as well -esp in regards to her health with her being alone  Makes her tearful   Tries not to google her symptoms  Also fidgits a lot  Hard to pay attention   Has a ladies group and she gets to talk and vent  Has not been to counseling (she checked and it is very expensive)  Interested in buspar   Sleeps about 5 h per night Not enough  She did a sleep study in 2019- and it did not work out (uncomfortable and did not sleep and by  coincidence she was getting sick and did not know it)   Used to snore  Used cpap before husband died  Got tired of it and it lead to more sinus infections    HTN BP Readings from Last 3 Encounters:  12/07/20 (!) 148/94  06/12/20 134/82  05/09/20 121/74   Had to stop hctz in the past due to hyponatremia  Was px metoprolol xl 25 mg daily  Had side eff of headache from that  Amlodipine caused a headache in the past also   Takes lisinopril 20 mg daily   Patient Active Problem List   Diagnosis Date Noted  . Chronic headaches 12/07/2020  . Hyponatremia 04/12/2020  . Colon cancer screening 10/29/2019  . Stress reaction 08/31/2019  . Dysuria 03/27/2018  . Breast cancer screening by mammogram 12/08/2017  . Estrogen deficiency 12/04/2016  . Infiltrate of lung present on imaging of chest 08/09/2016  . Grief reaction 09/09/2014  . Encounter for Medicare annual wellness exam 09/06/2013  . 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 Prediabetes 08/12/2012  . Other screening mammogram 03/22/2011  . Hyperlipidemia 01/01/2011  . Hypertension 01/01/2011  . GERD (gastroesophageal reflux disease) 01/01/2011  . Routine general medical examination at a  health care facility 01/01/2011  . Vitamin D deficiency disease 01/01/2011  . Osteopenia 01/01/2011  . Sleep apnea 01/01/2011  . Anxiety 01/01/2011  . Allergic rhinitis 01/01/2011  . Rosacea 01/01/2011   Past Medical History:  Diagnosis Date  . Allergy   . Bronchitis   . Colon polyps   . Depression   . GERD (gastroesophageal reflux disease)   . Heart murmur   . History of chicken pox   . Hx: UTI (urinary tract infection)   . Hyperlipidemia   . Hypertension   . Migraines   . Rosacea   . Sleep apnea    uses cpap every night    Past Surgical History:  Procedure Laterality Date  . DNC    . FOOT SURGERY     Social History   Tobacco Use  . Smoking status: Former Games developer  . Smokeless tobacco: Never Used  Vaping Use  . Vaping Use:  Never used  Substance Use Topics  . Alcohol use: Yes    Alcohol/week: 0.0 standard drinks    Comment: wine occasional  . Drug use: No   Family History  Problem Relation Age of Onset  . Heart disease Father   . Hypertension Father   . Diabetes Other   . Heart disease Mother   . Stroke Paternal Grandmother   . Heart disease Paternal Grandfather   . Hypertension Paternal Grandfather   . Breast cancer Paternal Aunt   . Breast cancer Sister   . Cancer Sister   . Bladder Cancer Neg Hx   . Kidney cancer Neg Hx    Allergies  Allergen Reactions  . Shellfish Allergy Anaphylaxis    Sob/ swelling  Sob/ swelling   . Amlodipine     headache   Current Outpatient Medications on File Prior to Visit  Medication Sig Dispense Refill  . aspirin 81 MG tablet Take 81 mg by mouth daily.    . Cholecalciferol (VITAMIN D3) 1000 UNITS CAPS Take 5,000 capsules by mouth daily.     Marland Kitchen EPINEPHrine (EPI-PEN) 0.3 mg/0.3 mL SOAJ injection Inject 0.3 mLs (0.3 mg total) into the muscle once. For allergic reaction 1 Device 3  . loratadine (CLARITIN) 10 MG tablet Take 10 mg by mouth daily.    Marland Kitchen omeprazole (PRILOSEC) 20 MG capsule Take 1 capsule (20 mg total) by mouth 2 (two) times daily. 180 capsule 3  . simvastatin (ZOCOR) 20 MG tablet Take 1 tablet (20 mg total) by mouth daily at 6 PM. 90 tablet 3  . metroNIDAZOLE (METROGEL) 1 % gel Apply topically 2 (two) times daily. (Patient not taking: Reported on 12/07/2020) 180 g 3   No current facility-administered medications on file prior to visit.    Review of Systems  Constitutional: Positive for fatigue. Negative for activity change, appetite change, fever and unexpected weight change.  HENT: Negative for congestion, ear pain, rhinorrhea, sinus pressure and sore throat.   Eyes: Negative for pain, redness and visual disturbance.  Respiratory: Negative for cough, shortness of breath and wheezing.   Cardiovascular: Negative for chest pain and palpitations.   Gastrointestinal: Negative for abdominal pain, blood in stool, constipation and diarrhea.  Endocrine: Negative for polydipsia and polyuria.  Genitourinary: Negative for dysuria, frequency and urgency.  Musculoskeletal: Negative for arthralgias, back pain and myalgias.  Skin: Negative for pallor and rash.  Allergic/Immunologic: Negative for environmental allergies.  Neurological: Positive for headaches. Negative for dizziness and syncope.  Hematological: Negative for adenopathy. Does not bruise/bleed easily.  Psychiatric/Behavioral: Positive  for decreased concentration and sleep disturbance. Negative for dysphoric mood and suicidal ideas. The patient is nervous/anxious.        Objective:   Physical Exam Constitutional:      General: She is not in acute distress.    Appearance: Normal appearance. She is well-developed. She is not ill-appearing or diaphoretic.  HENT:     Head: Normocephalic and atraumatic.  Eyes:     General: No scleral icterus.    Conjunctiva/sclera: Conjunctivae normal.     Pupils: Pupils are equal, round, and reactive to light.  Neck:     Thyroid: No thyromegaly.     Vascular: No carotid bruit or JVD.  Cardiovascular:     Rate and Rhythm: Normal rate and regular rhythm.     Heart sounds: Normal heart sounds. No gallop.   Pulmonary:     Effort: Pulmonary effort is normal. No respiratory distress.     Breath sounds: Normal breath sounds. No stridor. No wheezing, rhonchi or rales.  Abdominal:     General: Bowel sounds are normal. There is no distension or abdominal bruit.     Palpations: Abdomen is soft. There is no mass.     Tenderness: There is no abdominal tenderness.  Musculoskeletal:     Cervical back: Normal range of motion and neck supple.     Left lower leg: No edema.  Lymphadenopathy:     Cervical: No cervical adenopathy.  Skin:    General: Skin is warm and dry.     Coloration: Skin is not pale.     Findings: No erythema or rash.  Neurological:      Mental Status: She is alert.     Cranial Nerves: No cranial nerve deficit.     Coordination: Coordination normal.     Deep Tendon Reflexes: Reflexes are normal and symmetric. Reflexes normal.  Psychiatric:        Mood and Affect: Mood is anxious. Affect is not tearful.        Behavior: Behavior normal.        Thought Content: Thought content normal. Thought content does not include suicidal ideation.        Cognition and Memory: Cognition and memory normal.           Assessment & Plan:   Problem List Items Addressed This Visit      Cardiovascular and Mediastinum   Hypertension    BP: (!) 148/94    Amlodipine and metoprolol worsened headaches  hctz caused hyponatremia Will change her lisinopril 20 to benicar 40 daily  Will call if side eff  F/u 6-8 wk  Good health habits Hope better bp control will help headaches      Relevant Medications   olmesartan (BENICAR) 40 MG tablet     Respiratory   Sleep apnea    Suspect fatigue and am headaches may be resulting from this Off cpap for quite some time and interested in re eval  pulm ref made        Relevant Orders   Ambulatory referral to Pulmonology     Other   Anxiety - Primary    At times regarding health  Reviewed stressors/ coping techniques/symptoms/ support sources/ tx options and side effects in detail today Does go to women's group Cannot afford counseling  Px buspar 7.5 mg bid to start and update  Discussed expectations of this medication including time to effectiveness and mechanism of action, also poss of side effects (early and late)- including mental fuzziness,  weight or appetite change, nausea and poss of worse dep or anxiety (even suicidal thoughts)  Pt voiced understanding and will stop med and update if this occurs        Relevant Medications   busPIRone (BUSPAR) 15 MG tablet   Chronic headaches    Severe /migraine when younger Now more mild and sometimes wakes up with  No doubt elevated bp  and sleep apnea add to this  Will work on those problems  If no further imp consider neuro eval or prophylaxis

## 2020-12-07 NOTE — Patient Instructions (Addendum)
Stop lisinopril  Start generic benicar (olmasartan)  buspar -take 1/2 pill twice daily  If any intolerable side effects or feel worse (anxious /depressed) let me know   I placed a referral to pulmonary to discuss sleep apnea   Follow up in 6-8 weeks

## 2020-12-14 ENCOUNTER — Other Ambulatory Visit: Payer: Self-pay | Admitting: Family Medicine

## 2020-12-21 ENCOUNTER — Ambulatory Visit: Payer: Medicare HMO

## 2020-12-21 ENCOUNTER — Other Ambulatory Visit: Payer: Self-pay

## 2020-12-26 ENCOUNTER — Ambulatory Visit (INDEPENDENT_AMBULATORY_CARE_PROVIDER_SITE_OTHER): Payer: Medicare HMO

## 2020-12-26 ENCOUNTER — Other Ambulatory Visit: Payer: Self-pay

## 2020-12-26 DIAGNOSIS — Z111 Encounter for screening for respiratory tuberculosis: Secondary | ICD-10-CM

## 2020-12-26 NOTE — Progress Notes (Signed)
PPD Placement note Adriana Marks, 75 y.o. female is here today for placement of PPD test Reason for PPD test: wirj Pt taken PPD test before: yes Verified in allergy area and with patient that they are not allergic to the products PPD is made of (Phenol or Tween). Yes Is patient taking any oral or IV steroid medication now or have they taken it in the last month? no Has the patient ever received the BCG vaccine?: no Has the patient been in recent contact with anyone known or suspected of having active TB disease?: no      Date of exposure (if applicable): n/a      Name of person they were exposed to (if applicable): n/a Patient's Country of origin?: [place] O: Alert and oriented in NAD. P:  PPD placed in R forearm on 12/26/2020.  Patient advised to return for reading within 48-72 hours.

## 2020-12-27 ENCOUNTER — Other Ambulatory Visit: Payer: Self-pay

## 2020-12-27 ENCOUNTER — Ambulatory Visit: Payer: Medicare HMO | Admitting: Gastroenterology

## 2020-12-27 ENCOUNTER — Ambulatory Visit: Payer: Medicare HMO

## 2020-12-27 ENCOUNTER — Encounter: Payer: Self-pay | Admitting: Gastroenterology

## 2020-12-27 VITALS — BP 168/88 | HR 86 | Temp 98.1°F | Ht 62.25 in | Wt 170.0 lb

## 2020-12-27 DIAGNOSIS — Z1211 Encounter for screening for malignant neoplasm of colon: Secondary | ICD-10-CM

## 2020-12-27 DIAGNOSIS — K219 Gastro-esophageal reflux disease without esophagitis: Secondary | ICD-10-CM

## 2020-12-27 MED ORDER — NA SULFATE-K SULFATE-MG SULF 17.5-3.13-1.6 GM/177ML PO SOLN: 354.0000 mL | Freq: Once | ORAL | 0 refills | Status: AC

## 2020-12-27 NOTE — Progress Notes (Signed)
[name], MD [address]  [address]  [place], [place] [address]  Main: [phone]  Fax: [phone]    Gastroenterology Consultation  Referring Provider:     [name], MD Primary Care Physician:  [name], [name], MD Primary Gastroenterologist:  Dr. [name] Reason for Consultation:     Chronic GERD, colon cancer screening        HPI:   Adriana Marks is a 75 y.o. female referred by Dr. Milinda Antis, [name], MD  for consultation & management of chronic GERD.  Patient reports that she has been on several years of omeprazole 20 mg twice daily for heartburn.  Patient reports that her heartburn returns if she does not take the medication.  She also reports that she had esophagus stretched several years ago due to food getting stuck in her throat.  She is no longer experiencing difficulty swallowing.  She is also here to discuss about colonoscopy for screening along with upper endoscopy  She does not smoke or drink alcohol  NSAIDs: None  Antiplts/Anticoagulants/Anti thrombotics: None  GI Procedures: Upper endoscopy in 2003 and colonoscopy in 2010 normal  Past Medical History:  Diagnosis Date  . Allergy   . Bronchitis   . Colon polyps   . Depression   . GERD (gastroesophageal reflux disease)   . Heart murmur   . History of chicken pox   . Hx: UTI (urinary tract infection)   . Hyperlipidemia   . Hypertension   . Migraines   . Rosacea   . Sleep apnea    uses cpap every night     Past Surgical History:  Procedure Laterality Date  . DNC    . FOOT SURGERY      Current Outpatient Medications:  .  aspirin 81 MG tablet, Take 81 mg by mouth daily., Disp: , Rfl:  .  busPIRone (BUSPAR) 15 MG tablet, TAKE 1 TABLET BY MOUTH 2 TIMES DAILY., Disp: 180 tablet, Rfl: 0 .  Cholecalciferol (VITAMIN D3) 1000 UNITS CAPS, Take 5,000 capsules by mouth daily. , Disp: , Rfl:  .  EPINEPHrine (EPI-PEN) 0.3 mg/0.3 mL SOAJ injection, Inject 0.3 mLs (0.3 mg total) into the  muscle once. For allergic reaction, Disp: 1 Device, Rfl: 3 .  loratadine (CLARITIN) 10 MG tablet, Take 10 mg by mouth daily., Disp: , Rfl:  .  metroNIDAZOLE (METROGEL) 1 % gel, Apply topically 2 (two) times daily., Disp: 180 g, Rfl: 3 .  Na Sulfate-K Sulfate-Mg Sulf 17.5-3.13-1.6 GM/177ML SOLN, Take 354 mLs by mouth once for 1 dose., Disp: 354 mL, Rfl: 0 .  olmesartan (BENICAR) 40 MG tablet, Take 1 tablet (40 mg total) by mouth daily., Disp: 30 tablet, Rfl: 3 .  omeprazole (PRILOSEC) 20 MG capsule, Take 1 capsule (20 mg total) by mouth 2 (two) times daily., Disp: 180 capsule, Rfl: 3 .  simvastatin (ZOCOR) 20 MG tablet, Take 1 tablet (20 mg total) by mouth daily at 6 PM., Disp: 90 tablet, Rfl: 3   Family History  Problem Relation Age of Onset  . Heart disease Father   . Hypertension Father   . Diabetes Other   . Heart disease Mother   . Stroke Paternal Grandmother   . Heart disease Paternal Grandfather   . Hypertension Paternal Grandfather   . Breast cancer Paternal Aunt   . Breast cancer Sister   . Cancer Sister   . Bladder Cancer Neg Hx   . Kidney cancer Neg Hx  Social History   Tobacco Use  . Smoking status: Former Games developer  . Smokeless tobacco: Never Used  Vaping Use  . Vaping Use: Never used  Substance Use Topics  . Alcohol use: Yes    Alcohol/week: 0.0 standard drinks    Comment: wine occasional  . Drug use: No    Allergies as of 12/27/2020 - Review Complete 12/27/2020  Allergen Reaction Noted  . Shellfish allergy Anaphylaxis 04/27/2013  . Amlodipine  01/28/2020    Review of Systems:    All systems reviewed and negative except where noted in HPI.   Physical Exam:  BP (!) 168/88 (BP Location: Left Arm, Patient Position: Sitting, Cuff Size: Normal)   Pulse 86   Temp 98.1 F (36.7 C) (Oral)   Ht 5' 2.25" (1.581 m)   Wt 170 lb (77.1 kg)   LMP 08/26/1997   BMI 30.84 kg/m  Patient's last menstrual period was 08/26/1997.  General:   Alert,  Well-developed,  well-nourished, pleasant and cooperative in NAD Head:  Normocephalic and atraumatic. Eyes:  Sclera clear, no icterus.   Conjunctiva pink. Ears:  Normal auditory acuity. Nose:  No deformity, discharge, or lesions. Mouth:  No deformity or lesions,oropharynx pink & moist. Neck:  Supple; no masses or thyromegaly. Lungs:  Respirations even and unlabored.  Clear throughout to auscultation.   No wheezes, crackles, or rhonchi. No acute distress. Heart:  Regular rate and rhythm; no murmurs, clicks, rubs, or gallops. Abdomen:  Normal bowel sounds. Soft, non-tender and non-distended without masses, hepatosplenomegaly or hernias noted.  No guarding or rebound tenderness.   Rectal: Not performed Msk:  Symmetrical without gross deformities. Good, equal movement & strength bilaterally. Pulses:  Normal pulses noted. Extremities:  No clubbing or edema.  No cyanosis. Neurologic:  Alert and oriented x3;  grossly normal neurologically. Skin:  Intact without significant lesions or rashes. No jaundice. Psych:  Alert and cooperative. Normal mood and affect.  Imaging Studies: None  Assessment and Plan:   Adriana Marks is a 75 y.o. female with history of diabetes, chronic GERD maintained on omeprazole 20 twice daily  Chronic GERD ?  Esophageal dilation in the past Recommend EGD for Barrett's screening Continue omeprazole 20 twice daily  Recommend colonoscopy for colon cancer screening   Follow up as needed  [name], MD

## 2020-12-29 ENCOUNTER — Telehealth: Payer: Self-pay

## 2020-12-29 LAB — TB SKIN TEST
Induration: 0 mm
TB Skin Test: NEGATIVE

## 2020-12-29 NOTE — Telephone Encounter (Signed)
Called Trish and informed her to take off the EGD and called patient to inform her it was taken off

## 2020-12-29 NOTE — Telephone Encounter (Signed)
Patient would like to cancel her throat procedure on 01/25/21. Will complete the colon screening but does not want the stretching of the throat. Insurance won't cover at 100%. Patient would like to have a cancellation confirmation sent to her Mychart.

## 2021-01-02 ENCOUNTER — Telehealth: Payer: Self-pay | Admitting: Family Medicine

## 2021-01-02 NOTE — Telephone Encounter (Signed)
Patient dropped off paperwork needing it filled out for work. Patient states that she needs it by tomorrow 01/03/21 if possible. It is in Dr Lucretia Roers inbox to complete. EM

## 2021-01-02 NOTE — Telephone Encounter (Signed)
Done and in IN box 

## 2021-01-02 NOTE — Telephone Encounter (Signed)
Left VM letting pt know form ready for pick up, copy sent to scanning

## 2021-01-16 ENCOUNTER — Institutional Professional Consult (permissible substitution): Payer: Medicare HMO | Admitting: Adult Health

## 2021-01-24 ENCOUNTER — Encounter: Payer: Self-pay | Admitting: Gastroenterology

## 2021-01-25 ENCOUNTER — Ambulatory Visit: Payer: Medicare HMO | Admitting: Anesthesiology

## 2021-01-25 ENCOUNTER — Telehealth: Payer: Self-pay

## 2021-01-25 ENCOUNTER — Encounter
Admission: RE | Disposition: A | Discharge status: Home / Self Care | Payer: Self-pay | Source: Ambulatory Visit | Attending: Gastroenterology

## 2021-01-25 ENCOUNTER — Other Ambulatory Visit: Payer: Self-pay

## 2021-01-25 ENCOUNTER — Encounter: Payer: Self-pay | Admitting: Gastroenterology

## 2021-01-25 ENCOUNTER — Ambulatory Visit
Admission: RE | Admit: 2021-01-25 | Discharge: 2021-01-25 | Disposition: A | Discharge status: Home / Self Care | Payer: Medicare HMO | Source: Ambulatory Visit | Attending: Gastroenterology | Admitting: Gastroenterology

## 2021-01-25 DIAGNOSIS — Z833 Family history of diabetes mellitus: Secondary | ICD-10-CM | POA: Insufficient documentation

## 2021-01-25 DIAGNOSIS — Z1211 Encounter for screening for malignant neoplasm of colon: Secondary | ICD-10-CM | POA: Diagnosis not present

## 2021-01-25 DIAGNOSIS — Z8249 Family history of ischemic heart disease and other diseases of the circulatory system: Secondary | ICD-10-CM | POA: Insufficient documentation

## 2021-01-25 DIAGNOSIS — Z86010 Personal history of colonic polyps: Secondary | ICD-10-CM | POA: Insufficient documentation

## 2021-01-25 DIAGNOSIS — Z823 Family history of stroke: Secondary | ICD-10-CM | POA: Diagnosis not present

## 2021-01-25 DIAGNOSIS — Z87891 Personal history of nicotine dependence: Secondary | ICD-10-CM | POA: Diagnosis not present

## 2021-01-25 DIAGNOSIS — Z888 Allergy status to other drugs, medicaments and biological substances status: Secondary | ICD-10-CM | POA: Insufficient documentation

## 2021-01-25 DIAGNOSIS — K219 Gastro-esophageal reflux disease without esophagitis: Secondary | ICD-10-CM | POA: Insufficient documentation

## 2021-01-25 DIAGNOSIS — K629 Disease of anus and rectum, unspecified: Secondary | ICD-10-CM | POA: Diagnosis not present

## 2021-01-25 DIAGNOSIS — Z7982 Long term (current) use of aspirin: Secondary | ICD-10-CM | POA: Insufficient documentation

## 2021-01-25 DIAGNOSIS — I8500 Esophageal varices without bleeding: Secondary | ICD-10-CM

## 2021-01-25 DIAGNOSIS — Z91013 Allergy to seafood: Secondary | ICD-10-CM | POA: Insufficient documentation

## 2021-01-25 DIAGNOSIS — Z803 Family history of malignant neoplasm of breast: Secondary | ICD-10-CM | POA: Insufficient documentation

## 2021-01-25 DIAGNOSIS — K6289 Other specified diseases of anus and rectum: Secondary | ICD-10-CM | POA: Insufficient documentation

## 2021-01-25 HISTORY — DX: Nausea with vomiting, unspecified: R11.2

## 2021-01-25 HISTORY — PX: COLONOSCOPY WITH PROPOFOL: SHX5780

## 2021-01-25 HISTORY — DX: Other specified postprocedural states: R11.2

## 2021-01-25 HISTORY — DX: Other specified postprocedural states: Z98.890

## 2021-01-25 SURGERY — COLONOSCOPY WITH PROPOFOL
Anesthesia: General

## 2021-01-25 MED ORDER — PROPOFOL 500 MG/50ML IV EMUL
INTRAVENOUS | Status: AC
  Filled 2021-01-25: qty 50

## 2021-01-25 MED ORDER — LIDOCAINE HCL (PF) 2 % IJ SOLN
INTRAMUSCULAR | Status: AC
  Filled 2021-01-25: qty 2

## 2021-01-25 MED ORDER — PROPOFOL 500 MG/50ML IV EMUL
INTRAVENOUS | Status: DC | PRN
  Administered 2021-01-25: 150 ug/kg/min via INTRAVENOUS

## 2021-01-25 MED ORDER — PROPOFOL 10 MG/ML IV BOLUS
INTRAVENOUS | Status: DC | PRN
  Administered 2021-01-25: 80 mg via INTRAVENOUS

## 2021-01-25 MED ORDER — LIDOCAINE HCL (CARDIAC) PF 100 MG/5ML IV SOSY
PREFILLED_SYRINGE | INTRAVENOUS | Status: DC | PRN
  Administered 2021-01-25: 40 mg via INTRAVENOUS

## 2021-01-25 MED ORDER — SODIUM CHLORIDE 0.9 % IV SOLN: INTRAVENOUS | Status: DC

## 2021-01-25 MED ORDER — LABETALOL HCL 5 MG/ML IV SOLN
INTRAVENOUS | Status: DC | PRN
  Administered 2021-01-25: 10 mg via INTRAVENOUS

## 2021-01-25 NOTE — Op Note (Signed)
[facility] Gastroenterology Patient Name: Adriana Marks Procedure Date: 01/25/2021 9:46 AM MRN: [id number] Account #: [id number] Date of Birth: 11-10-1945 Admit Type: Outpatient Age: 75 Room: [facility] ENDO ROOM 3 Gender: Female Note Status: Finalized Procedure:             Colonoscopy Indications:           Screening for colorectal malignant neoplasm, Last                         colonoscopy: December 2010 Providers:             Toney Reil MD, MD Referring MD:          [name]. [name] (Referring MD) Medicines:             General Anesthesia Complications:         No immediate complications. Estimated blood loss: None. Procedure:             Pre-Anesthesia Assessment:                        - Prior to the procedure, a History and Physical was                         performed, and patient medications and allergies were                         reviewed. The patient is competent. The risks and                         benefits of the procedure and the sedation options and                         risks were discussed with the patient. All questions                         were answered and informed consent was obtained.                         Patient identification and proposed procedure were                         verified by the physician, the nurse, the                         anesthesiologist, the anesthetist and the technician                         in the pre-procedure area in the procedure room in the                         endoscopy suite. Mental Status Examination: alert and                         oriented. Airway Examination: normal oropharyngeal                         airway and neck mobility. Respiratory Examination:  clear to auscultation. CV Examination: normal.                         Prophylactic Antibiotics: The patient does not require                         prophylactic antibiotics. Prior Anticoagulants: The                          patient has taken no previous anticoagulant or                         antiplatelet agents except for aspirin. ASA Grade                         Assessment: II - A patient with mild systemic disease.                         After reviewing the risks and benefits, the patient                         was deemed in satisfactory condition to undergo the                         procedure. The anesthesia plan was to use general                         anesthesia. Immediately prior to administration of                         medications, the patient was re-assessed for adequacy                         to receive sedatives. The heart rate, respiratory                         rate, oxygen saturations, blood pressure, adequacy of                         pulmonary ventilation, and response to care were                         monitored throughout the procedure. The physical                         status of the patient was re-assessed after the                         procedure.                        After obtaining informed consent, the colonoscope was                         passed under direct vision. Throughout the procedure,                         the patient's blood pressure, pulse, and oxygen  saturations were monitored continuously. The                         Colonoscope was introduced through the anus and                         advanced to the the terminal ileum, with                         identification of the appendiceal orifice and IC                         valve. The colonoscopy was performed without                         difficulty. The patient tolerated the procedure well.                         The quality of the bowel preparation was evaluated                         using the BBPS [facility] Bowel Preparation Scale) with                         scores of: Right Colon = 3, Transverse Colon = 3 and                         Left Colon = 3  (entire mucosa seen well with no                         residual staining, small fragments of stool or opaque                         liquid). The total BBPS score equals 9. Findings:      The perianal and digital rectal examinations were normal. Pertinent       negatives include normal sphincter tone and no palpable rectal lesions.      A localized area of granular mucosa was found in the distal rectum.       Biopsies were taken with a cold forceps for histology.      The retroflexed view of the distal rectum and anal verge was normal and       showed no anal or rectal abnormalities.      The exam was otherwise without abnormality. Impression:            - Granularity in the distal rectum. Biopsied.                        - The distal rectum and anal verge are normal on                         retroflexion view.                        - The examination was otherwise normal. Recommendation:        - Discharge patient to home (with escort).                        -  Resume previous diet today.                        - Continue present medications.                        - Await pathology results. Procedure Code(s):     --- Professional ---                        [phone], Colonoscopy, flexible; with biopsy, single or                         multiple Diagnosis Code(s):     --- Professional ---                        Z12.11, Encounter for screening for malignant neoplasm                         of colon                        K62.89, Other specified diseases of anus and rectum CPT copyright 2019 American Medical Association. All rights reserved. The codes documented in this report are preliminary and upon coder review may  be revised to meet current compliance requirements. Dr. Libby Maw Toney Reil MD, MD 01/25/2021 10:13:52 AM This report has been signed electronically. Number of Addenda: 0 Note Initiated On: 01/25/2021 9:46 AM Scope Withdrawal Time: 0 hours 10 minutes 50 seconds   Total Procedure Duration: 0 hours 15 minutes 10 seconds  Estimated Blood Loss:  Estimated blood loss: none.      [facility]

## 2021-01-25 NOTE — Transfer of Care (Signed)
Immediate Anesthesia Transfer of Care Note  Patient: Adriana Marks  Procedure(s) Performed: COLONOSCOPY WITH PROPOFOL (N/A )  Patient Location: PACU  Anesthesia Type:General  Level of Consciousness: awake, alert  and oriented  Airway & Oxygen Therapy: Patient Spontanous Breathing  Post-op Assessment: Report given to RN and Post -op Vital signs reviewed and stable  Post vital signs: Reviewed and stable  Last Vitals:  Vitals Value Taken Time  BP 139/79 01/25/21 1015  Temp    Pulse 73 01/25/21 1015  Resp 18 01/25/21 1015  SpO2 100 % 01/25/21 1015    Last Pain:  Vitals:   01/25/21 0745  TempSrc: Temporal  PainSc: 0-No pain         Complications: No complications documented.

## 2021-01-25 NOTE — Anesthesia Procedure Notes (Signed)
Date/Time: 01/25/2021 9:50 AM Performed by: Ginger Carne, CRNA Pre-anesthesia Checklist: Patient identified, Emergency Drugs available, Suction available, Patient being monitored and Timeout performed Patient Re-evaluated:Patient Re-evaluated prior to induction Oxygen Delivery Method: Nasal cannula Preoxygenation: Pre-oxygenation with 100% oxygen Induction Type: IV induction

## 2021-01-25 NOTE — Anesthesia Preprocedure Evaluation (Addendum)
Anesthesia Evaluation  Patient identified by MRN, date of birth, ID band Patient awake    Reviewed: Allergy & Precautions, NPO status , Patient's Chart, lab work & pertinent test results  History of Anesthesia Complications (+) PONV and history of anesthetic complications  Airway Mallampati: II  TM Distance: >3 FB Neck ROM: Full    Dental no notable dental hx. (+) Teeth Intact   Pulmonary neg pulmonary ROS, neg sleep apnea, neg COPD, Patient abstained from smoking.Not current smoker, former smoker,    Pulmonary exam normal breath sounds clear to auscultation       Cardiovascular Exercise Tolerance: Good METShypertension, (-) CAD and (-) Past MI (-) dysrhythmias  Rhythm:Regular Rate:Normal - Systolic murmurs    Neuro/Psych  Headaches, PSYCHIATRIC DISORDERS Anxiety Depression    GI/Hepatic GERD  Medicated,(+)     (-) substance abuse  ,   Endo/Other  neg diabetes  Renal/GU negative Renal ROS     Musculoskeletal   Abdominal   Peds  Hematology   Anesthesia Other Findings Past Medical History: No date: Allergy No date: Bronchitis No date: Colon polyps No date: Depression No date: GERD (gastroesophageal reflux disease) No date: Heart murmur No date: History of chicken pox No date: Hx: UTI (urinary tract infection) No date: Hyperlipidemia No date: Hypertension No date: Migraines No date: PONV (postoperative nausea and vomiting) No date: Rosacea No date: Sleep apnea     Comment:  uses cpap every night   Reproductive/Obstetrics                            Anesthesia Physical Anesthesia Plan  ASA: II  Anesthesia Plan: General   Post-op Pain Management:    Induction: Intravenous  PONV Risk Score and Plan: 4 or greater and Ondansetron, Propofol infusion and TIVA  Airway Management Planned: Nasal Cannula  Additional Equipment: None  Intra-op Plan:   Post-operative Plan:    Informed Consent: I have reviewed the patients History and Physical, chart, labs and discussed the procedure including the risks, benefits and alternatives for the proposed anesthesia with the patient or authorized representative who has indicated his/her understanding and acceptance.     Dental advisory given  Plan Discussed with: CRNA and Surgeon  Anesthesia Plan Comments: (Discussed risks of anesthesia with patient, including possibility of difficulty with spontaneous ventilation under anesthesia necessitating airway intervention, PONV, and rare risks such as cardiac or respiratory or neurological events. Patient understands.)        Anesthesia Quick Evaluation

## 2021-01-25 NOTE — Telephone Encounter (Signed)
-----   Message from Toney Reil, MD sent at 01/25/2021  2:04 PM EDT ----- Regarding: EGD She did not undergo EGD today because of insurance issues and not being covered  Let's reschedule EGD for screening of Barrett's esophagus  RV

## 2021-01-25 NOTE — Anesthesia Postprocedure Evaluation (Signed)
Anesthesia Post Note  Patient: Adriana Marks  Procedure(s) Performed: COLONOSCOPY WITH PROPOFOL (N/A )  Patient location during evaluation: Endoscopy Anesthesia Type: General Level of consciousness: awake and alert Pain management: pain level controlled Vital Signs Assessment: post-procedure vital signs reviewed and stable Respiratory status: spontaneous breathing, nonlabored ventilation, respiratory function stable and patient connected to nasal cannula oxygen Cardiovascular status: blood pressure returned to baseline and stable Postop Assessment: no apparent nausea or vomiting Anesthetic complications: no   No complications documented.   Last Vitals:  Vitals:   01/25/21 1030 01/25/21 1040  BP: 115/77 (!) 173/79  Pulse: 71 66  Resp: 17 13  Temp:    SpO2: 100% 99%    Last Pain:  Vitals:   01/25/21 0745  TempSrc: Temporal  PainSc: 0-No pain                 Corinda Gubler

## 2021-01-25 NOTE — Telephone Encounter (Signed)
Called and got patient scheduled for 02/01/2021 at [facility]

## 2021-01-25 NOTE — H&P (Signed)
Adriana Repress, MD [address]  [address]  [place], [place] [address]  Main: [phone]  Fax: [phone] Pager: [phone]  Primary Care Physician:  [name], [name], MD Primary Gastroenterologist:  Dr. Arlyss Marks  Pre-Procedure History & Physical: HPI:  Adriana Marks is a 75 y.o. female is here for an colonoscopy.   Past Medical History:  Diagnosis Date  . Allergy   . Bronchitis   . Colon polyps   . Depression   . GERD (gastroesophageal reflux disease)   . Heart murmur   . History of chicken pox   . Hx: UTI (urinary tract infection)   . Hyperlipidemia   . Hypertension   . Migraines   . PONV (postoperative nausea and vomiting)   . Rosacea   . Sleep apnea    uses cpap every night     Past Surgical History:  Procedure Laterality Date  . DNC    . FOOT SURGERY      Prior to Admission medications   Medication Sig Start Date End Date Taking? Authorizing Provider  aspirin 81 MG tablet Take 81 mg by mouth daily.   Yes [provider]  busPIRone (BUSPAR) 15 MG tablet TAKE 1 TABLET BY MOUTH 2 TIMES DAILY. 12/15/20  Yes [name], [name], MD  Cholecalciferol (VITAMIN D3) 1000 UNITS CAPS Take 5,000 capsules by mouth daily.    Yes [provider]  loratadine (CLARITIN) 10 MG tablet Take 10 mg by mouth daily.   Yes [provider]  olmesartan (BENICAR) 40 MG tablet Take 1 tablet (40 mg total) by mouth daily. 12/07/20  Yes [name], [name], MD  omeprazole (PRILOSEC) 20 MG capsule Take 1 capsule (20 mg total) by mouth 2 (two) times daily. 04/12/20  Yes [name], [name], MD  simvastatin (ZOCOR) 20 MG tablet Take 1 tablet (20 mg total) by mouth daily at 6 PM. 04/12/20  Yes [name], [name], MD  Adriana Marks (EPI-PEN) 0.3 mg/0.3 mL SOAJ injection Inject 0.3 mLs (0.3 mg total) into the muscle once. For allergic reaction 04/27/13   [name], [name], MD  metroNIDAZOLE (METROGEL) 1 % gel Apply topically 2 (two) times daily. 08/31/19   [name], [name], MD     Allergies as of 12/27/2020 - Review Complete 12/27/2020  Allergen Reaction Noted  . Shellfish allergy Anaphylaxis 04/27/2013  . Amlodipine  01/28/2020    Family History  Problem Relation Age of Onset  . Heart disease Father   . Hypertension Father   . Diabetes Other   . Heart disease Mother   . Stroke Paternal Grandmother   . Heart disease Paternal Grandfather   . Hypertension Paternal Grandfather   . Breast cancer Paternal Aunt   . Breast cancer Sister   . Cancer Sister   . Bladder Cancer Neg Hx   . Kidney cancer Neg Hx     Social History   Socioeconomic History  . Marital status: Widowed    Spouse name: Not on file  . Number of children: Not on file  . Years of education: Not on file  . Highest education level: Not on file  Occupational History  . Occupation: Fish farm manager: [employer]  Tobacco Use  . Smoking status: Former Games developer  . Smokeless tobacco: Never Used  Vaping Use  . Vaping Use: Never used  Substance and Sexual Activity  . Alcohol use: Yes    Alcohol/week: 0.0 standard drinks    Comment: wine occasional  .  Drug use: No  . Sexual activity: Never  Other Topics Concern  . 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 Difficulty of Paying Living Expenses: Not hard at all  Food Insecurity: No Food Insecurity  . Worried About Programme researcher, broadcasting/film/video in the Last Year: Never true  . Ran Out of Food in the Last Year: Never true  Transportation Needs: No Transportation Needs  . Lack of Transportation (Medical): No  . Lack of Transportation (Non-Medical): No  Physical Activity: Sufficiently Active  . Days of  Exercise per Week: 7 days  . Minutes of Exercise per Session: 30 min  Stress: No Stress Concern Present  . Feeling of Stress : Not at all  Social Connections: Not on file  Intimate Partner Violence: Not At Risk  . Fear of Current or Ex-Partner: No  . Emotionally Abused: No  . Physically Abused: No  . Sexually Abused: No    Review of Systems: See HPI, otherwise negative ROS  Physical Exam: BP (!) 181/90   Pulse 84   Temp 98.1 F (36.7 C) (Temporal)   Resp 16   Ht 5\' 2"  (1.575 m)   Wt 72.1 kg   LMP 08/26/1997   SpO2 100%   BMI 29.08 kg/m  General:   Alert,  pleasant and cooperative in NAD Head:  Normocephalic and atraumatic. Neck:  Supple; no masses or thyromegaly. Lungs:  Clear throughout to auscultation.    Heart:  Regular rate and rhythm. Abdomen:  Soft, nontender and nondistended. Normal bowel sounds, without guarding, and without rebound.   Neurologic:  Alert and  oriented x4;  grossly normal neurologically.  Impression/Plan: Adriana Marks is here for an colonoscopy to be performed for colon cancer screening  Risks, benefits, limitations, and alternatives regarding  colonoscopy have been reviewed with the patient.  Questions have been answered.  All parties agreeable.   Adriana Gore, MD  01/25/2021, 8:43 AM

## 2021-01-26 ENCOUNTER — Encounter: Payer: Self-pay | Admitting: Gastroenterology

## 2021-01-26 ENCOUNTER — Telehealth: Payer: Self-pay | Admitting: Gastroenterology

## 2021-01-26 ENCOUNTER — Ambulatory Visit: Payer: Medicare HMO | Admitting: Family Medicine

## 2021-01-26 LAB — SURGICAL PATHOLOGY

## 2021-01-26 NOTE — Telephone Encounter (Signed)
Patient has a few questions about the upcoming procedure scheduled 02/01/2021. Per patient. Clinical staff will follow up with patient.

## 2021-01-29 ENCOUNTER — Other Ambulatory Visit: Payer: Self-pay

## 2021-01-29 ENCOUNTER — Ambulatory Visit (INDEPENDENT_AMBULATORY_CARE_PROVIDER_SITE_OTHER): Payer: Medicare HMO | Admitting: Family Medicine

## 2021-01-29 ENCOUNTER — Encounter: Payer: Self-pay | Admitting: Family Medicine

## 2021-01-29 VITALS — BP 142/88 | HR 74 | Temp 97.6°F | Ht 62.0 in | Wt 167.0 lb

## 2021-01-29 DIAGNOSIS — F419 Anxiety disorder, unspecified: Secondary | ICD-10-CM

## 2021-01-29 DIAGNOSIS — G4730 Sleep apnea, unspecified: Secondary | ICD-10-CM

## 2021-01-29 DIAGNOSIS — R519 Headache, unspecified: Secondary | ICD-10-CM | POA: Diagnosis not present

## 2021-01-29 DIAGNOSIS — I10 Essential (primary) hypertension: Secondary | ICD-10-CM

## 2021-01-29 DIAGNOSIS — G8929 Other chronic pain: Secondary | ICD-10-CM | POA: Diagnosis not present

## 2021-01-29 DIAGNOSIS — R69 Illness, unspecified: Secondary | ICD-10-CM | POA: Diagnosis not present

## 2021-01-29 DIAGNOSIS — F4321 Adjustment disorder with depressed mood: Secondary | ICD-10-CM | POA: Diagnosis not present

## 2021-01-29 NOTE — Assessment & Plan Note (Signed)
Plans on f/u with pulmonary for f/u and poss tx This should further help bp and headaches

## 2021-01-29 NOTE — Telephone Encounter (Signed)
Looked in charges colonoscopy is a screening colonoscopy.

## 2021-01-29 NOTE — Assessment & Plan Note (Signed)
Some improvement with better bp and improved anxiety  Good health habits  Enc her to re schedule appt with pulm to address her sleep apnea

## 2021-01-29 NOTE — Assessment & Plan Note (Signed)
Improving with buspar 7.5 mg bid She is considering inc to 15 mg bid - disc poss side eff and will keep [place] updated  Stress is lower (after a bad experience with colonoscopy) and now headaches are also improving F/u in aug

## 2021-01-29 NOTE — Telephone Encounter (Signed)
Colonoscopy should be a screening, I think it's an error  I took biopsies of rectum only, she might have been charged for pathology probably  EGD is also a screening for Barrett's, But, if she prefers not to be done, that's fine  She won't need any follow up as per my last office visit  Asher Muir, could you please check into the colonoscopy charges for her?  Thanks [employer]

## 2021-01-29 NOTE — Telephone Encounter (Signed)
Patient states she is not going to have a EGD because she is not having no upper GI symptoms and they will not approved it through her insurance company. She states she has cancel her follow up appointment because she does not feel like she is getting the best care and we are just charging her for stuff she does not need. She also wants to know why we did not just code her colonoscopy as a screening. Informed patient it got changed to a diagnostic when a path report got sent off. She said she did not have not polyps or anything and she is not going to be charge for anything but a screening. Please advise

## 2021-01-29 NOTE — Assessment & Plan Note (Signed)
bp in fair control at this time  BP Readings from Last 1 Encounters:  01/29/21 (!) 142/88   No changes needed-improving Disc proper time to check bp at home (is lower)  Most recent labs reviewed  Disc lifstyle change with low sodium diet and exercise  Plan to continue benicar 40 mg daily and re check in august at annual exam

## 2021-01-29 NOTE — Progress Notes (Signed)
Subjective:    Patient ID: Adriana Marks, female    DOB: Jul 28, 1946, 75 y.o.   MRN: [id number]  This visit occurred during the SARS-CoV-2 public health emergency.  Safety protocols were in place, including screening questions prior to the visit, additional usage of staff PPE, and extensive cleaning of exam room while observing appropriate contact time as indicated for disinfecting solutions.    HPI  Pt presents for f/u of anxiety and HTN and headaches  Wt Readings from Last 3 Encounters:  01/29/21 167 lb (75.8 kg)  01/25/21 159 lb (72.1 kg)  12/27/20 170 lb (77.1 kg)   30.54 kg/m  Had a colonoscopy - that was stressful    HTN   BP Readings from Last 3 Encounters:  01/29/21 (!) 142/88  01/25/21 (!) 173/79  12/27/20 (!) 168/88    Pulse Readings from Last 3 Encounters:  01/29/21 74  01/25/21 66  12/27/20 86   Taking benicar 40 mg daily  (took at 6:30 this am)  No side effects or problems  A little improved  At home 126/80 in evening one day    In the past hctz caused low na level  Amlodipine caused headaches  Metoprolol may have also inc headache   Lab Results  Component Value Date   CREATININE 0.70 06/12/2020   BUN 10 06/12/2020   NA 137 06/12/2020   K 4.4 06/12/2020   CL 102 06/12/2020   CO2 29 06/12/2020    Anxiety  (recent GI practice made her anxious)  In general - less anxious  Last visit started buspar 7.5 mg bid - for first 2 weeks a little light headed  Noted that therapy fr pt would be very $$  Disc self care and socialization   Chronic headaches  Improved - thankful for that  Noted when she has a headache-tightens her jaw  Last visit -no imp with beta blocker  Worked on better bp control with change to benicar 40 mg daily  Also ref to pulmonary for sleep apnea (off cpap)   (she had to change her appt)   Patient Active Problem List   Diagnosis Date Noted  . Sleep apnea 01/29/2021  . Chronic headaches 12/07/2020  . Hyponatremia  04/12/2020  . Screening for colon cancer 10/29/2019  . Stress reaction 08/31/2019  . Breast cancer screening by mammogram 12/08/2017  . Estrogen deficiency 12/04/2016  . Infiltrate of lung present on imaging of chest 08/09/2016  . Grief reaction 09/09/2014  . Adjustment disorder with depressed mood 09/09/2014  . Encounter for Medicare annual wellness exam 09/06/2013  . 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  . Prediabetes 08/12/2012  . Other abnormal glucose 08/12/2012  . Other screening mammogram 03/22/2011  . Hyperlipidemia 01/01/2011  . Hypertension 01/01/2011  . GERD (gastroesophageal reflux disease) 01/01/2011  . Routine general medical examination at a health care facility 01/01/2011  . Vitamin D deficiency disease 01/01/2011  . Osteopenia 01/01/2011  . Anxiety 01/01/2011  . Allergic rhinitis 01/01/2011  . Rosacea 01/01/2011  . Disorder of bone and cartilage 01/01/2011   Past Medical History:  Diagnosis Date  . Allergy   . Bronchitis   . Colon polyps   . Depression   . GERD (gastroesophageal reflux disease)   . Heart murmur   . History of chicken pox   . Hx: UTI (urinary tract infection)   . Hyperlipidemia   . Hypertension   . Migraines   . PONV (postoperative nausea and vomiting)   .  Rosacea   . Sleep apnea    uses cpap every night    Past Surgical History:  Procedure Laterality Date  . COLONOSCOPY WITH PROPOFOL N/A 01/25/2021   Procedure: COLONOSCOPY WITH PROPOFOL;  Surgeon: Toney Reil, MD;  Location: [facility] ENDOSCOPY;  Service: Gastroenterology;  Laterality: N/A;  . DNC    . FOOT SURGERY     Social History   Tobacco Use  . Smoking status: Former Games developer  . Smokeless tobacco: Never Used  Vaping Use  . Vaping Use: Never used  Substance Use Topics  . Alcohol use: Yes    Alcohol/week: 0.0 standard drinks    Comment: wine occasional  . Drug use: No   Family History  Problem Relation Age of Onset  . Heart disease Father   .  Hypertension Father   . Diabetes Other   . Heart disease Mother   . Stroke Paternal Grandmother   . Heart disease Paternal Grandfather   . Hypertension Paternal Grandfather   . Breast cancer Paternal Aunt   . Breast cancer Sister   . Cancer Sister   . Bladder Cancer Neg Hx   . Kidney cancer Neg Hx    Allergies  Allergen Reactions  . Shellfish Allergy Anaphylaxis    Sob/ swelling  Sob/ swelling   . Amlodipine     headache   Current Outpatient Medications on File Prior to Visit  Medication Sig Dispense Refill  . aspirin 81 MG tablet Take 81 mg by mouth daily.    . busPIRone (BUSPAR) 15 MG tablet TAKE 1 TABLET BY MOUTH 2 TIMES DAILY. 180 tablet 0  . EPINEPHrine (EPI-PEN) 0.3 mg/0.3 mL SOAJ injection Inject 0.3 mLs (0.3 mg total) into the muscle once. For allergic reaction 1 Device 3  . loratadine (CLARITIN) 10 MG tablet Take 10 mg by mouth daily.    . metroNIDAZOLE (METROGEL) 1 % gel Apply topically 2 (two) times daily. 180 g 3  . olmesartan (BENICAR) 40 MG tablet Take 1 tablet (40 mg total) by mouth daily. 30 tablet 3  . omeprazole (PRILOSEC) 20 MG capsule Take 1 capsule (20 mg total) by mouth 2 (two) times daily. 180 capsule 3  . simvastatin (ZOCOR) 20 MG tablet Take 1 tablet (20 mg total) by mouth daily at 6 PM. 90 tablet 3   No current facility-administered medications on file prior to visit.    Review of Systems  Constitutional: Negative for activity change, appetite change, fatigue, fever and unexpected weight change.  HENT: Negative for congestion, ear pain, rhinorrhea, sinus pressure and sore throat.   Eyes: Negative for pain, redness and visual disturbance.  Respiratory: Negative for cough, shortness of breath and wheezing.   Cardiovascular: Negative for chest pain and palpitations.  Gastrointestinal: Negative for abdominal pain, blood in stool, constipation and diarrhea.  Endocrine: Negative for polydipsia and polyuria.  Genitourinary: Negative for dysuria, frequency  and urgency.  Musculoskeletal: Negative for arthralgias, back pain and myalgias.  Skin: Negative for pallor and rash.  Allergic/Immunologic: Negative for environmental allergies.  Neurological: Positive for headaches. Negative for dizziness and syncope.  Hematological: Negative for adenopathy. Does not bruise/bleed easily.  Psychiatric/Behavioral: Positive for sleep disturbance. Negative for decreased concentration and dysphoric mood. The patient is nervous/anxious.        Objective:   Physical Exam Constitutional:      General: She is not in acute distress.    Appearance: Normal appearance. She is well-developed. She is obese.  HENT:     Head:  Normocephalic and atraumatic.  Eyes:     Conjunctiva/sclera: Conjunctivae normal.     Pupils: Pupils are equal, round, and reactive to light.  Neck:     Thyroid: No thyromegaly.     Vascular: No carotid bruit or JVD.  Cardiovascular:     Rate and Rhythm: Normal rate and regular rhythm.     Heart sounds: Normal heart sounds. No gallop.   Pulmonary:     Effort: Pulmonary effort is normal. No respiratory distress.     Breath sounds: Normal breath sounds. No wheezing or rales.  Abdominal:     General: Bowel sounds are normal. There is no distension or abdominal bruit.     Palpations: Abdomen is soft. There is no mass.     Tenderness: There is no abdominal tenderness.  Musculoskeletal:     Cervical back: Normal range of motion and neck supple.  Lymphadenopathy:     Cervical: No cervical adenopathy.  Skin:    General: Skin is warm and dry.     Coloration: Skin is not pale.     Findings: No erythema or rash.  Neurological:     Mental Status: She is alert.     Deep Tendon Reflexes: Reflexes are normal and symmetric.  Psychiatric:        Mood and Affect: Mood is anxious.     Comments: Mildly anxious   Pt went into great detail today when discussing stressful situation of dealing with GI practice at [facility] for her recent colonoscopy and  notes it was very distressing to her            Assessment & Plan:   Problem List Items Addressed This Visit      Cardiovascular and Mediastinum   Hypertension - Primary    bp in fair control at this time  BP Readings from Last 1 Encounters:  01/29/21 (!) 142/88   No changes needed-improving Disc proper time to check bp at home (is lower)  Most recent labs reviewed  Disc lifstyle change with low sodium diet and exercise  Plan to continue benicar 40 mg daily and re check in august at annual exam        Respiratory   Sleep apnea    Plans on f/u with pulmonary for f/u and poss tx This should further help bp and headaches        Other   Anxiety    Improving with buspar 7.5 mg bid She is considering inc to 15 mg bid - disc poss side eff and will keep [place] updated  Stress is lower (after a bad experience with colonoscopy) and now headaches are also improving F/u in aug      Chronic headaches    Some improvement with better bp and improved anxiety  Good health habits  Enc her to re schedule appt with pulm to address her sleep apnea      Adjustment disorder with depressed mood

## 2021-01-29 NOTE — Patient Instructions (Addendum)
You can try increasing the buspar to a whole pill twice if and when you want to   Take care of yourself  Avoid excessive sodium   Re schedule with pulmonary when you can

## 2021-02-01 ENCOUNTER — Ambulatory Visit: Admission: RE | Admit: 2021-02-01 | Payer: Medicare HMO | Source: Home / Self Care | Admitting: Gastroenterology

## 2021-02-01 ENCOUNTER — Encounter: Admission: RE | Payer: Self-pay | Source: Home / Self Care

## 2021-02-01 SURGERY — ESOPHAGOGASTRODUODENOSCOPY (EGD) WITH PROPOFOL
Anesthesia: General

## 2021-03-01 ENCOUNTER — Telehealth: Payer: Self-pay | Admitting: *Deleted

## 2021-03-01 NOTE — Chronic Care Management (AMB) (Signed)
  Chronic Care Management   Note  03/01/2021 Name: SHARLIZE HOAR MRN: [id number] DOB: 06-25-1946  Daryel November is a 75 y.o. year old female who is a primary care patient of [name], Wynelle Fanny, MD. I reached out to Daryel November by phone today in response to a referral sent by Ms. Caro Laroche PCP [name], Wynelle Fanny, MD     Ms. Pellegrin was given information about Chronic Care Management services today including:  CCM service includes personalized support from designated clinical staff supervised by her physician, including individualized plan of care and coordination with other care providers 24/7 contact phone numbers for assistance for urgent and routine care needs. Service will only be billed when office clinical staff spend 20 minutes or more in a month to coordinate care. Only one practitioner may furnish and bill the service in a calendar month. The patient may stop CCM services at any time (effective at the end of the month) by phone call to the office staff. The patient will be responsible for cost sharing (co-pay) of up to 20% of the service fee (after annual deductible is met).  Patient agreed to services and verbal consent obtained.   Follow up plan: Telephone appointment with care management team member scheduled for:03/22/2021  Julian Hy, [place], [place] Management  Direct Dial: [phone]

## 2021-03-04 ENCOUNTER — Other Ambulatory Visit: Payer: Self-pay | Admitting: Family Medicine

## 2021-03-05 ENCOUNTER — Encounter: Payer: Self-pay | Admitting: Family Medicine

## 2021-03-05 ENCOUNTER — Emergency Department: Payer: Medicare HMO

## 2021-03-05 ENCOUNTER — Other Ambulatory Visit: Payer: Self-pay

## 2021-03-05 ENCOUNTER — Emergency Department
Admission: EM | Admit: 2021-03-05 | Discharge: 2021-03-05 | Disposition: A | Discharge status: Home / Self Care | Payer: Medicare HMO | Attending: Emergency Medicine | Admitting: Emergency Medicine

## 2021-03-05 DIAGNOSIS — I10 Essential (primary) hypertension: Secondary | ICD-10-CM | POA: Insufficient documentation

## 2021-03-05 DIAGNOSIS — Z87891 Personal history of nicotine dependence: Secondary | ICD-10-CM | POA: Diagnosis not present

## 2021-03-05 DIAGNOSIS — R519 Headache, unspecified: Secondary | ICD-10-CM | POA: Insufficient documentation

## 2021-03-05 DIAGNOSIS — R0789 Other chest pain: Secondary | ICD-10-CM

## 2021-03-05 DIAGNOSIS — I160 Hypertensive urgency: Secondary | ICD-10-CM | POA: Insufficient documentation

## 2021-03-05 DIAGNOSIS — Z79899 Other long term (current) drug therapy: Secondary | ICD-10-CM | POA: Insufficient documentation

## 2021-03-05 DIAGNOSIS — R69 Illness, unspecified: Secondary | ICD-10-CM | POA: Diagnosis not present

## 2021-03-05 DIAGNOSIS — J9811 Atelectasis: Secondary | ICD-10-CM | POA: Diagnosis not present

## 2021-03-05 DIAGNOSIS — Z743 Need for continuous supervision: Secondary | ICD-10-CM | POA: Diagnosis not present

## 2021-03-05 DIAGNOSIS — Z7982 Long term (current) use of aspirin: Secondary | ICD-10-CM | POA: Insufficient documentation

## 2021-03-05 DIAGNOSIS — R079 Chest pain, unspecified: Secondary | ICD-10-CM | POA: Diagnosis not present

## 2021-03-05 DIAGNOSIS — R531 Weakness: Secondary | ICD-10-CM | POA: Diagnosis not present

## 2021-03-05 LAB — COMPREHENSIVE METABOLIC PANEL
ALT: 18 U/L (ref 0–44)
AST: 30 U/L (ref 15–41)
Albumin: 4.2 g/dL (ref 3.5–5.0)
Alkaline Phosphatase: 61 U/L (ref 38–126)
Anion gap: 6 (ref 5–15)
BUN: 10 mg/dL (ref 8–23)
CO2: 26 mmol/L (ref 22–32)
Calcium: 9.1 mg/dL (ref 8.9–10.3)
Chloride: 100 mmol/L (ref 98–111)
Creatinine, Ser: 0.82 mg/dL (ref 0.44–1.00)
GFR, Estimated: >60 mL/min (ref >60)
Glucose, Bld: 118 mg/dL — ABNORMAL HIGH (ref 70–99)
Potassium: 3.9 mmol/L (ref 3.5–5.1)
Sodium: 132 mmol/L — ABNORMAL LOW (ref 135–145)
Total Bilirubin: 0.7 mg/dL (ref 0.3–1.2)
Total Protein: 7 g/dL (ref 6.5–8.1)

## 2021-03-05 LAB — CBC WITH DIFFERENTIAL/PLATELET
Abs Immature Granulocytes: 0.02 10*3/uL (ref 0.00–0.07)
Basophils Absolute: 0.1 10*3/uL (ref 0.0–0.1)
Basophils Relative: 1 %
Eosinophils Absolute: 0.2 10*3/uL (ref 0.0–0.5)
Eosinophils Relative: 2 %
HCT: 38 % (ref 36.0–46.0)
Hemoglobin: 13.1 g/dL (ref 12.0–15.0)
Immature Granulocytes: 0 %
Lymphocytes Relative: 19 %
Lymphs Abs: 1.5 10*3/uL (ref 0.7–4.0)
MCH: 31.3 pg (ref 26.0–34.0)
MCHC: 34.5 g/dL (ref 30.0–36.0)
MCV: 90.9 fL (ref 80.0–100.0)
Monocytes Absolute: 0.6 10*3/uL (ref 0.1–1.0)
Monocytes Relative: 8 %
Neutro Abs: 5.5 10*3/uL (ref 1.7–7.7)
Neutrophils Relative %: 70 %
Platelets: 231 10*3/uL (ref 150–400)
RBC: 4.18 MIL/uL (ref 3.87–5.11)
RDW: 12.1 % (ref 11.5–15.5)
WBC: 7.8 10*3/uL (ref 4.0–10.5)
nRBC: 0 % (ref 0.0–0.2)

## 2021-03-05 LAB — TROPONIN I (HIGH SENSITIVITY): Troponin I (High Sensitivity): 4 ng/L (ref <18)

## 2021-03-05 MED ORDER — MAGNESIUM SULFATE 2 GM/50ML IV SOLN
2.0000 g | Freq: Once | INTRAVENOUS | Status: AC
  Administered 2021-03-05: 2 g via INTRAVENOUS
  Filled 2021-03-05: qty 50

## 2021-03-05 MED ORDER — PROCHLORPERAZINE EDISYLATE 10 MG/2ML IJ SOLN
10.0000 mg | Freq: Once | INTRAMUSCULAR | Status: AC
  Administered 2021-03-05: 10 mg via INTRAVENOUS
  Filled 2021-03-05: qty 2

## 2021-03-05 MED ORDER — DIPHENHYDRAMINE HCL 50 MG/ML IJ SOLN
12.5000 mg | Freq: Once | INTRAMUSCULAR | Status: AC
  Administered 2021-03-05: 12.5 mg via INTRAVENOUS
  Filled 2021-03-05: qty 1

## 2021-03-05 NOTE — ED Triage Notes (Signed)
Pt to ED from home, [facility] Pt called EMS because felt weak, flushed, pressure sensation in head and home BP machine read 180/100  Pt has been taking BP meds as prescribed, meds changed 4/22, takes 1 beta blocker only  EMS VS normal except BP on scene was 217/102 12 lead unremarkable  Pt in NAD, transferred self to stretcher Dr Katrinka Blazing at bedside  Pt states HA is exactly same as has been every day for last year. Takes Buspirone for anxiety  Denies urinary symptoms

## 2021-03-05 NOTE — ED Provider Notes (Signed)
[facility] Emergency Department Provider Note  ____________________________________________   Event Date/Time   First MD Initiated Contact with Patient 03/05/21 1150     (approximate)  I have reviewed the triage vital signs and the nursing notes.   HISTORY  Chief Complaint Hypertension   HPI [name] is a 75 y.o. female with a past medical history of depression, GERD, HTN, HDL, migraines and OSA wearing CPAP at night who presents to EMS from home for assessment of some tightness in her chest as well as dizziness and weakness in her knees in the setting of chronic headache and chronic well-controlled blood pressure.  Patient states she is noted to the PCP to just her blood pressure medicines and when she read this morning was 180/100 but per EMS it was 217/102.  She states her headache today is not any different than it has been over the last several months almost a year that she feels a little more anxious and had a little tightness in her chest earlier which was new.  She states she was not sure if she is having a panic attack or not she has fairly severe anxiety treated with BuSpar.  States her weakness in her legs is now much better and she never had any recent injuries or falls.  She denies any vision changes, vertigo, cough, shortness of breath, nausea, vomiting abdominal pain, back pain, urinary symptoms or rash.  Endorses intermittent diarrhea over the last couple months with no new diarrhea today.  No other acute clear symptoms.  No EtOH or illicit drug use.         Past Medical History:  Diagnosis Date   Allergy    Bronchitis    Colon polyps    Depression    GERD (gastroesophageal reflux disease)    Heart murmur    History of chicken pox    Hx: UTI (urinary tract infection)    Hyperlipidemia    Hypertension    Migraines    PONV (postoperative nausea and vomiting)    Rosacea    Sleep apnea    uses cpap every night     Patient Active  Problem List   Diagnosis Date Noted   Sleep apnea 01/29/2021   Chronic headaches 12/07/2020   Hyponatremia 04/12/2020   Screening for colon cancer 10/29/2019   Stress reaction 08/31/2019   Breast cancer screening by mammogram 12/08/2017   Estrogen deficiency 12/04/2016   Infiltrate of lung present on imaging of chest 08/09/2016   Grief reaction 09/09/2014   Adjustment disorder with depressed mood 09/09/2014   Encounter for Medicare annual wellness exam 09/06/2013   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   Prediabetes 08/12/2012   Other abnormal glucose 08/12/2012   Other screening mammogram 03/22/2011   Hyperlipidemia 01/01/2011   Hypertension 01/01/2011   GERD (gastroesophageal reflux disease) 01/01/2011   Routine general medical examination at a health care facility 01/01/2011   Vitamin D deficiency disease 01/01/2011   Osteopenia 01/01/2011   Anxiety 01/01/2011   Allergic rhinitis 01/01/2011   Rosacea 01/01/2011   Disorder of bone and cartilage 01/01/2011    Past Surgical History:  Procedure Laterality Date   COLONOSCOPY WITH PROPOFOL N/A 01/25/2021   Procedure: COLONOSCOPY WITH PROPOFOL;  Surgeon: Toney Reil, MD;  Location: [facility] ENDOSCOPY;  Service: Gastroenterology;  Laterality: N/A;   DNC     FOOT SURGERY      Prior to Admission medications   Medication Sig Start  Date End Date Taking? Authorizing Provider  aspirin 81 MG tablet Take 81 mg by mouth daily.    [provider]  busPIRone (BUSPAR) 15 MG tablet TAKE 1 TABLET BY MOUTH 2 TIMES DAILY. 12/15/20   [name], [name], MD  EPINEPHrine (EPI-PEN) 0.3 mg/0.3 mL SOAJ injection Inject 0.3 mLs (0.3 mg total) into the muscle once. For allergic reaction 04/27/13   [name], [name], MD  loratadine (CLARITIN) 10 MG tablet Take 10 mg by mouth daily.    [provider]  metroNIDAZOLE (METROGEL) 1 % gel Apply topically 2 (two) times daily. 08/31/19   [name], [name], MD  olmesartan (BENICAR) 40  MG tablet Take 1 tablet (40 mg total) by mouth daily. 12/07/20   [name], [name], MD  omeprazole (PRILOSEC) 20 MG capsule Take 1 capsule (20 mg total) by mouth 2 (two) times daily. 04/12/20   [name], [name], MD  simvastatin (ZOCOR) 20 MG tablet Take 1 tablet (20 mg total) by mouth daily at 6 PM. 04/12/20   [name], [name], MD    Allergies Shellfish allergy and Amlodipine  Family History  Problem Relation Age of Onset   Heart disease Father    Hypertension Father    Diabetes Other    Heart disease Mother    Stroke Paternal Grandmother    Heart disease Paternal Grandfather    Hypertension Paternal Grandfather    Breast cancer Paternal Aunt    Breast cancer Sister    Cancer Sister    Bladder Cancer Neg Hx    Kidney cancer Neg Hx     Social History Social History   Tobacco Use   Smoking status: Former    Pack years: 0.00   Smokeless tobacco: Never  Vaping Use   Vaping Use: Never used  Substance Use Topics   Alcohol use: Yes    Alcohol/week: 0.0 standard drinks    Comment: wine occasional   Drug use: No    Review of Systems  Review of Systems  Constitutional:  Negative for chills and fever.  HENT:  Negative for sore throat.   Eyes:  Negative for pain.  Respiratory:  Negative for cough and stridor.   Cardiovascular:  Positive for chest pain.  Gastrointestinal:  Negative for vomiting.  Genitourinary:  Negative for dysuria.  Musculoskeletal:  Negative for myalgias.  Skin:  Negative for rash.  Neurological:  Positive for weakness and headaches. Negative for seizures and loss of consciousness.  Psychiatric/Behavioral:  Negative for suicidal ideas. The patient is nervous/anxious.   All other systems reviewed and are negative.    ____________________________________________   PHYSICAL EXAM:  VITAL SIGNS: ED Triage Vitals  Enc Vitals Group     BP      Pulse      Resp      Temp      Temp src      SpO2      Weight      Height      Head Circumference      Peak  Flow      Pain Score      Pain Loc      Pain Edu?      Excl. in GC?    Vitals:   03/05/21 1151 03/05/21 1418  BP: (!) 208/86 (!) 201/87  Pulse: 68 71  Resp: 15 14  Temp: 97.6 F (36.4 C)   SpO2: 100% 100%   Physical Exam Vitals and nursing note reviewed.  Constitutional:  General: She is not in acute distress.    Appearance: She is well-developed.  HENT:     Head: Normocephalic and atraumatic.     Right Ear: External ear normal.     Left Ear: External ear normal.  Eyes:     Conjunctiva/sclera: Conjunctivae normal.  Cardiovascular:     Rate and Rhythm: Normal rate and regular rhythm.     Heart sounds: No murmur heard. Pulmonary:     Effort: Pulmonary effort is normal. No respiratory distress.     Breath sounds: Normal breath sounds.  Abdominal:     Palpations: Abdomen is soft.     Tenderness: There is no abdominal tenderness.  Musculoskeletal:     Cervical back: Neck supple.  Skin:    General: Skin is warm and dry.     Capillary Refill: Capillary refill takes less than 2 seconds.  Neurological:     Mental Status: She is alert and oriented to person, place, and time.  Psychiatric:        Mood and Affect: Mood normal.     ____________________________________________   LABS (all labs ordered are listed, but only abnormal results are displayed)  Labs Reviewed  COMPREHENSIVE METABOLIC PANEL - Abnormal; Notable for the following components:      Result Value   Sodium 132 (*)    Glucose, Bld 118 (*)    All other components within normal limits  CBC WITH DIFFERENTIAL/PLATELET  TROPONIN I (HIGH SENSITIVITY)  TROPONIN I (HIGH SENSITIVITY)   ____________________________________________  EKG  Sinus rhythm with ventricular to 67, normal axis, unremarkable intervals without evidence of acute ischemia or significant arrhythmia. ____________________________________________  RADIOLOGY  ED MD interpretation: CT head shows no evidence of acute intracranial  process including hemorrhage or mass-effect.  Chest x-ray shows no focal consolidation, effusion, significant edema or pneumothorax.  Official radiology report(s): DG Chest 2 View  Result Date: 03/05/2021 CLINICAL DATA:  Chest pain, weakness, pressure sensation in head, flushed feeling EXAM: CHEST - 2 VIEW COMPARISON:  05/14/2017 FINDINGS: Normal heart size, mediastinal contours, and pulmonary vascularity. Slightly decreased lung volumes versus previous exam with minimal bibasilar atelectasis. No acute infiltrate, pleural effusion, or pneumothorax. Scattered endplate spur formation thoracic spine. IMPRESSION: Decreased lung volumes with bibasilar atelectasis. Electronically Signed   By: Ulyses SouthwardMark  Boles M.D.   On: 03/05/2021 12:55   CT Head Wo Contrast  Result Date: 03/05/2021 CLINICAL DATA:  Headache. EXAM: CT HEAD WITHOUT CONTRAST TECHNIQUE: Contiguous axial images were obtained from the base of the skull through the vertex without intravenous contrast. COMPARISON:  05/15/2012 FINDINGS: Brain: There is no evidence for acute hemorrhage, hydrocephalus, mass lesion, or abnormal extra-axial fluid collection. No definite CT evidence for acute infarction. Vascular: No hyperdense vessel or unexpected calcification. Skull: No evidence for fracture. No worrisome lytic or sclerotic lesion. Sinuses/Orbits: The visualized paranasal sinuses and mastoid air cells are clear. Visualized portions of the globes and intraorbital fat are unremarkable. Other: None. IMPRESSION: No acute intracranial abnormality. Electronically Signed   By: Kennith CenterEric  Mansell M.D.   On: 03/05/2021 13:01    ____________________________________________   PROCEDURES  Procedure(s) performed (including Critical Care):  .1-3 Lead EKG Interpretation  Date/Time: 03/05/2021 3:32 PM Performed by: Gilles ChiquitoSmith, Milanna Kozlov P, MD Authorized by: Gilles ChiquitoSmith, Bracken Moffa P, MD     Interpretation: normal     ECG rate assessment: normal     Rhythm: sinus rhythm     Ectopy:  none     Conduction: normal     ____________________________________________  INITIAL IMPRESSION / ASSESSMENT AND PLAN / ED COURSE      Patient presents with above-stated history exam for assessment of some chest tightness anxiety in the setting of chronic headache and poorly controlled blood pressures.  She is known to be hypertensive with a BP of 208/86 with otherwise stable vital signs on arrival.  She otherwise has a nonfocal neuro exam and states she is compliant with her meds.  Suspect possible symptomatic hypertension.   CT head shows no evidence of acute intracranial process including hemorrhage or mass-effect.  There were no findings on exam to suggest CVA.  Chest x-ray shows no focal consolidation, effusion, significant edema or pneumothorax.  Patient does not appear volume overloaded and there is no other evidence of CHF and overall Evalose patient for dissection.  EKG and nonelevated troponin not consistent with ACS.  CMP shows no significant electrolyte or metabolic derangements.  CBC shows no leukocytosis or acute anemia.  Patient treated with below noted headache cocktail and on reassessment her BP was 178/70 and she stated her headache is actually much improved and her chest tightness has resolved.  Certainly possible her symptoms related to high blood pressure given otherwise reassuring exam and work-up patient stating she felt completely much better I think she is stable for close outpatient follow-up for further titration of her blood pressure meds.  Discharged stable condition.  Return precautions advised and discussed.       ____________________________________________   FINAL CLINICAL IMPRESSION(S) / ED DIAGNOSES  Final diagnoses:  Hypertensive urgency  Nonintractable headache, unspecified chronicity pattern, unspecified headache type  Chest tightness    Medications  magnesium sulfate IVPB 2 g 50 mL (0 g Intravenous Stopped 03/05/21 1417)  prochlorperazine  (COMPAZINE) injection 10 mg (10 mg Intravenous Given 03/05/21 1222)  diphenhydrAMINE (BENADRYL) injection 12.5 mg (12.5 mg Intravenous Given 03/05/21 1223)     ED Discharge Orders     None        Note:  This document was prepared using Dragon voice recognition software and may include unintentional dictation errors.    Gilles Chiquito, MD 03/05/21 [phone]

## 2021-03-05 NOTE — ED Notes (Signed)
Pt taken  To CT.

## 2021-03-05 NOTE — ED Notes (Signed)
Pt signed paper discharge consent. 

## 2021-03-05 NOTE — ED Notes (Signed)
Pt ambulatory to restroom, NAD noted.  

## 2021-03-06 ENCOUNTER — Telehealth: Payer: Self-pay | Admitting: *Deleted

## 2021-03-06 MED ORDER — LISINOPRIL 20 MG PO TABS: 20.0000 mg | ORAL_TABLET | Freq: Every day | ORAL | 3 refills | Status: DC

## 2021-03-06 NOTE — Telephone Encounter (Signed)
See prev note. Pt's mychart said:  Dr Milinda Antis I will be out of my olmesartan Wednesday I was wondering if I could stop olmesartan and start taking Lisinopril again. My blood pressure has still been high and I am still having headaches with olmesartan. I will be having a sleep consultation July 27th for sleep apnea so I am hoping after getting cpap machine my headaches in the morning will go away. Thanks Ardeen Fillers

## 2021-03-06 NOTE — Telephone Encounter (Signed)
Patient left a voicemail stating that she was calling about her blood pressure medication. Patient stated that she will run out Wednesday. Patient stated that she went to the ER because of her blood pressure yesterday. Please see mychart message and ER visit notes.

## 2021-03-06 NOTE — Telephone Encounter (Signed)
I sent lisinopril to her pharmacy- stop the olmesartan and start that   If headache does not improve then f/u with first available  Watch bp   F/u with me upon return

## 2021-03-07 NOTE — Telephone Encounter (Signed)
Pt notified Rx sent to pharmacy and stop the benicar, pt advised of Dr. Royden Purl instructions and f/u appt scheduled with PCP on 03/16/21

## 2021-03-13 ENCOUNTER — Telehealth: Payer: Self-pay

## 2021-03-13 NOTE — Telephone Encounter (Signed)
Symptoms started on 03/10/21-itchy ears, post nasal drainage, cough from drainage, coughing up greenish/yellow color this morning, itchy throat. No fever. No headache. No body aches. No SOB.  She took COVID test on Saturday 03/11/21 and today 03/13/21 and they both were negative.  We have no appointments today or tomorrow with anyone. Patient has appointment already scheduled on 03/16/21 for B/P follow up and wanted to see if we need to reschedule or can she still be seen, what can she do for her symptoms? Appointment sooner somewhere else?

## 2021-03-13 NOTE — Telephone Encounter (Signed)
Pt said she will treat sxs with OTC meds. And if no improvement will call back for a virtual or go to UC her main reason for calling was due to her having a f/u for BP scheduled this Friday. Appt rescheduled and ER precautions given.   FYI to PCP

## 2021-03-13 NOTE — Telephone Encounter (Signed)
If she wants to be seen will have to be virtual with first available (in or out of our office) or UC  Treat symptoms, drink fluids If severe- ER  Put off her f/u appt for another week so she is not coming in with uri symptoms  Thanks

## 2021-03-14 ENCOUNTER — Other Ambulatory Visit: Payer: Self-pay | Admitting: Family Medicine

## 2021-03-14 DIAGNOSIS — Z1231 Encounter for screening mammogram for malignant neoplasm of breast: Secondary | ICD-10-CM

## 2021-03-16 ENCOUNTER — Ambulatory Visit: Payer: Medicare HMO | Admitting: Family Medicine

## 2021-03-18 ENCOUNTER — Telehealth: Payer: Medicare HMO | Admitting: Family

## 2021-03-18 DIAGNOSIS — R059 Cough, unspecified: Secondary | ICD-10-CM

## 2021-03-18 MED ORDER — DOXYCYCLINE HYCLATE 100 MG PO TABS: 100.0000 mg | ORAL_TABLET | Freq: Two times a day (BID) | ORAL | 0 refills | Status: DC

## 2021-03-18 MED ORDER — BENZONATATE 100 MG PO CAPS: 100.0000 mg | ORAL_CAPSULE | Freq: Three times a day (TID) | ORAL | 0 refills | Status: DC | PRN

## 2021-03-18 NOTE — Progress Notes (Signed)

## 2021-03-21 ENCOUNTER — Institutional Professional Consult (permissible substitution): Payer: Medicare HMO | Admitting: Primary Care

## 2021-03-22 ENCOUNTER — Ambulatory Visit: Payer: Medicare HMO

## 2021-03-22 NOTE — Patient Instructions (Signed)
Visit Information  Thank you for allowing me to share the care management and care coordination services that are available to you as part of your health plan and services through your primary care provider and medical home. Please reach out to me at [phone] if the care management/care coordination team may be of assistance to you in the future.   George Ina BSN, RN,CCM RN Case Manager [facility]  [phone]

## 2021-03-22 NOTE — Chronic Care Management (AMB) (Signed)
  Care Management   Follow Up Note   03/22/2021 Name: Adriana Marks MRN: [id number] DOB: [birth year]   Referred by: [name], [name], MD Reason for referral : No chief complaint on file.   Successful contact was made with the patient to discuss care management and care coordination services. Patient declines engagement at this time.   Follow Up Plan: The patient has been provided with contact information for the care management team and has been advised to call with any health related questions or concerns.   George Ina RN,BSN,CCM RN Case Manager [facility] [place]  [phone]

## 2021-03-26 DIAGNOSIS — H40003 Preglaucoma, unspecified, bilateral: Secondary | ICD-10-CM | POA: Diagnosis not present

## 2021-03-26 DIAGNOSIS — H2513 Age-related nuclear cataract, bilateral: Secondary | ICD-10-CM | POA: Diagnosis not present

## 2021-03-27 ENCOUNTER — Encounter: Payer: Self-pay | Admitting: Family Medicine

## 2021-03-27 ENCOUNTER — Ambulatory Visit (INDEPENDENT_AMBULATORY_CARE_PROVIDER_SITE_OTHER): Payer: Medicare HMO | Admitting: Family Medicine

## 2021-03-27 ENCOUNTER — Other Ambulatory Visit: Payer: Self-pay

## 2021-03-27 VITALS — BP 170/96 | HR 77 | Temp 97.8°F | Ht 64.0 in | Wt 167.0 lb

## 2021-03-27 DIAGNOSIS — I10 Essential (primary) hypertension: Secondary | ICD-10-CM | POA: Diagnosis not present

## 2021-03-27 DIAGNOSIS — R519 Headache, unspecified: Secondary | ICD-10-CM

## 2021-03-27 DIAGNOSIS — G8929 Other chronic pain: Secondary | ICD-10-CM | POA: Diagnosis not present

## 2021-03-27 DIAGNOSIS — F419 Anxiety disorder, unspecified: Secondary | ICD-10-CM

## 2021-03-27 DIAGNOSIS — R69 Illness, unspecified: Secondary | ICD-10-CM | POA: Diagnosis not present

## 2021-03-27 MED ORDER — SERTRALINE HCL 50 MG PO TABS: 50.0000 mg | ORAL_TABLET | Freq: Every day | ORAL | 3 refills | Status: DC

## 2021-03-27 NOTE — Assessment & Plan Note (Signed)
Suspect fueling her inc bp  ER visit-panic attack may have worsened symptoms Reviewed stressors/ coping techniques/symptoms/ support sources/ tx options and side effects in detail today Plan to continue buspar and add zoloft 25 inc to 50 mg daily (did well in the past) Discussed expectations of SSRI medication including time to effectiveness and mechanism of action, also poss of side effects (early and late)- including mental fuzziness, weight or appetite change, nausea and poss of worse dep or anxiety (even suicidal thoughts)  Pt voiced understanding and will stop med and update if this occurs   Long disc re: counseling-she will consider it (handout given on CBT)  Handout re: anx Will continue her widow's support group Continue part time job Focus on self care F/u later this mo as planned

## 2021-03-27 NOTE — Patient Instructions (Addendum)
Take 1/2 tablet of zoloft (sertraline) once daily in the evening  Then after a week go up to 1 pill daily  If any intolerable side effects or if you feel worse-stop it and let us know   Continue the buspar as you are taking it   Take care of yourself  Eat less processed foods  Drink fluids Exercise indoors when you can tolerate it   Take a break from checking blood pressure -put the cuff away  Follow up as planned and bring blood pressure cuff with you   In the future if/when you are interested in counseling, let us know  Continue your support group  Keep working

## 2021-03-27 NOTE — Assessment & Plan Note (Signed)
bp is much better at home than here  Pt much prefers lisinopril to benicar and thinks it works better  [employer] take hctz (low na), amlodipine (ha) or metoprolol (ha)  Anxiety seems to greatly inc bp  Seen in ER (Reviewed hospital records, lab results and studies in detail )- very reassuring w/u  Checking bp at home seems to consume pt and she worries constantly  bp today is elevated (as usual outside home when anx) and she forgot her buspar Plan to work on anxiety (adding zoloft) and f/u as planned later this mo She will bring her own cuff as well

## 2021-03-27 NOTE — Assessment & Plan Note (Signed)
Reassuring CT- reviewed today  Suspect multiple triggers incl anxiety and HTN and sleep apnea Working on all of these  Re assess at f/u soon  Reassuring exam today

## 2021-03-27 NOTE — Progress Notes (Signed)
Subjective:    Patient ID: Adriana Marks, female    DOB: Jul 10, 1946, 75 y.o.   MRN: [id number]  This visit occurred during the SARS-CoV-2 public health emergency.  Safety protocols were in place, including screening questions prior to the visit, additional usage of staff PPE, and extensive cleaning of exam room while observing appropriate contact time as indicated for disinfecting solutions.   HPI Pt presents for f/u of HTN   Wt Readings from Last 3 Encounters:  03/27/21 167 lb (75.8 kg)  03/05/21 150 lb (68 kg)  01/29/21 167 lb (75.8 kg)   28.67 kg/m   Last visit -planned to continue benicar 40 mg daily  Then pt called noting that she thinks the medication was not controlling her bp and causing headaches   She had a sleep consult-planning cpap -sept  We changed her back to lisinopril   Forgot to take her bp pill this am  BP Readings from Last 3 Encounters:  03/27/21 (!) 170/96  03/05/21 (!) 201/87  01/29/21 (!) 142/88   Home readings are quite labile She got a new cuff -likes it   One teens to 140s systolic 60s to 70s diastolic   Pulse Readings from Last 3 Encounters:  03/27/21 77  03/05/21 71  01/29/21 74   Has tried amlodipine and metoprolol in the past and both worsened headaches Hctz caused low na  She was seen in ER for chest tightness and anxiety and chronic headache and elevated bp  Thinks she had a panic attack   ? If checking bp is making her more anxious   CT chest was reassuring Cxr clear  EKG reassuring  Treated with HA cocktail (mag, fluids, compazine and benadryl) and bp improved but was still high   DG Chest 2 View  Result Date: 03/05/2021 CLINICAL DATA:  Chest pain, weakness, pressure sensation in head, flushed feeling EXAM: CHEST - 2 VIEW COMPARISON:  05/14/2017 FINDINGS: Normal heart size, mediastinal contours, and pulmonary vascularity. Slightly decreased lung volumes versus previous exam with minimal bibasilar atelectasis. No acute  infiltrate, pleural effusion, or pneumothorax. Scattered endplate spur formation thoracic spine. IMPRESSION: Decreased lung volumes with bibasilar atelectasis. Electronically Signed   By: Ulyses SouthwardMark  Boles M.D.   On: 03/05/2021 12:55   CT Head Wo Contrast  Result Date: 03/05/2021 CLINICAL DATA:  Headache. EXAM: CT HEAD WITHOUT CONTRAST TECHNIQUE: Contiguous axial images were obtained from the base of the skull through the vertex without intravenous contrast. COMPARISON:  05/15/2012 FINDINGS: Brain: There is no evidence for acute hemorrhage, hydrocephalus, mass lesion, or abnormal extra-axial fluid collection. No definite CT evidence for acute infarction. Vascular: No hyperdense vessel or unexpected calcification. Skull: No evidence for fracture. No worrisome lytic or sclerotic lesion. Sinuses/Orbits: The visualized paranasal sinuses and mastoid air cells are clear. Visualized portions of the globes and intraorbital fat are unremarkable. Other: None. IMPRESSION: No acute intracranial abnormality. Electronically Signed   By: Kennith CenterEric  Mansell M.D.   On: 03/05/2021 13:01    Chronic headaches  Hoping that cpap will help   Anxiety  Takes buspar 15 mg bid  Not helping anymore Was on zoloft for years- she went off of it when she lost the medication  Never started it back  By herself a lot  Worries about health and everything  Does not want to burden family   Works a part time job She thinks it helps her   Today-has a headache /worse when she does not sleep well  Has done counseling before -not recent  Will think about it    Just getting over bronchitis   Patient Active Problem List   Diagnosis Date Noted   Sleep apnea 01/29/2021   Chronic headaches 12/07/2020   Hyponatremia 04/12/2020   Screening for colon cancer 10/29/2019   Stress reaction 08/31/2019   Breast cancer screening by mammogram 12/08/2017   Estrogen deficiency 12/04/2016   Infiltrate of lung present on imaging of chest  08/09/2016   Grief reaction 09/09/2014   Adjustment disorder with depressed mood 09/09/2014   Encounter for Medicare annual wellness exam 09/06/2013   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   Prediabetes 08/12/2012   Other abnormal glucose 08/12/2012   Other screening mammogram 03/22/2011   Hyperlipidemia 01/01/2011   Hypertension 01/01/2011   GERD (gastroesophageal reflux disease) 01/01/2011   Routine general medical examination at a health care facility 01/01/2011   Vitamin D deficiency disease 01/01/2011   Osteopenia 01/01/2011   Anxiety 01/01/2011   Allergic rhinitis 01/01/2011   Rosacea 01/01/2011   Disorder of bone and cartilage 01/01/2011   Past Medical History:  Diagnosis Date   Allergy    Bronchitis    Colon polyps    Depression    GERD (gastroesophageal reflux disease)    Heart murmur    History of chicken pox    Hx: UTI (urinary tract infection)    Hyperlipidemia    Hypertension    Migraines    PONV (postoperative nausea and vomiting)    Rosacea    Sleep apnea    uses cpap every night    Past Surgical History:  Procedure Laterality Date   COLONOSCOPY WITH PROPOFOL N/A 01/25/2021   Procedure: COLONOSCOPY WITH PROPOFOL;  Surgeon: Toney Reil, MD;  Location: [facility] ENDOSCOPY;  Service: Gastroenterology;  Laterality: N/A;   DNC     FOOT SURGERY     Social History   Tobacco Use   Smoking status: Former   Smokeless tobacco: Never  Vaping Use   Vaping Use: Never used  Substance Use Topics   Alcohol use: Yes    Alcohol/week: 0.0 standard drinks    Comment: wine occasional   Drug use: No   Family History  Problem Relation Age of Onset   Heart disease Father    Hypertension Father    Diabetes Other    Heart disease Mother    Stroke Paternal Grandmother    Heart disease Paternal Grandfather    Hypertension Paternal Grandfather    Breast cancer Paternal Aunt    Breast cancer Sister    Cancer Sister    Bladder Cancer Neg Hx     Kidney cancer Neg Hx    Allergies  Allergen Reactions   Shellfish Allergy Anaphylaxis    Sob/ swelling  Sob/ swelling    Amlodipine     headache   Current Outpatient Medications on File Prior to Visit  Medication Sig Dispense Refill   aspirin 81 MG tablet Take 81 mg by mouth daily.     benzonatate (TESSALON PERLES) 100 MG capsule Take 1 capsule (100 mg total) by mouth 3 (three) times daily as needed. 20 capsule 0   busPIRone (BUSPAR) 15 MG tablet TAKE 1 TABLET BY MOUTH 2 TIMES DAILY. 180 tablet 0   EPINEPHrine (EPI-PEN) 0.3 mg/0.3 mL SOAJ injection Inject 0.3 mLs (0.3 mg total) into the muscle once. For allergic reaction 1 Device 3   lisinopril (ZESTRIL) 20 MG tablet Take 1 tablet (20 mg  total) by mouth daily. 90 tablet 3   loratadine (CLARITIN) 10 MG tablet Take 10 mg by mouth daily.     metroNIDAZOLE (METROGEL) 1 % gel Apply topically 2 (two) times daily. 180 g 3   omeprazole (PRILOSEC) 20 MG capsule Take 1 capsule (20 mg total) by mouth 2 (two) times daily. 180 capsule 3   simvastatin (ZOCOR) 20 MG tablet Take 1 tablet (20 mg total) by mouth daily at 6 PM. 90 tablet 3   No current facility-administered medications on file prior to visit.     Review of Systems  Constitutional:  Negative for activity change, appetite change, fatigue, fever and unexpected weight change.  HENT:  Negative for congestion, ear pain, rhinorrhea, sinus pressure and sore throat.   Eyes:  Negative for pain, redness and visual disturbance.  Respiratory:  Negative for cough, shortness of breath and wheezing.   Cardiovascular:  Negative for chest pain and palpitations.  Gastrointestinal:  Negative for abdominal pain, blood in stool, constipation and diarrhea.  Endocrine: Negative for polydipsia and polyuria.  Genitourinary:  Negative for dysuria, frequency and urgency.  Musculoskeletal:  Negative for arthralgias, back pain and myalgias.  Skin:  Negative for pallor and rash.  Allergic/Immunologic: Negative  for environmental allergies.  Neurological:  Positive for headaches. Negative for dizziness and syncope.  Hematological:  Negative for adenopathy. Does not bruise/bleed easily.  Psychiatric/Behavioral:  Negative for decreased concentration and dysphoric mood. The patient is nervous/anxious.       Objective:   Physical Exam Constitutional:      General: She is not in acute distress.    Appearance: Normal appearance. She is well-developed and normal weight.  HENT:     Head: Normocephalic and atraumatic.  Eyes:     Conjunctiva/sclera: Conjunctivae normal.     Pupils: Pupils are equal, round, and reactive to light.  Neck:     Thyroid: No thyromegaly.     Vascular: No carotid bruit or JVD.  Cardiovascular:     Rate and Rhythm: Normal rate and regular rhythm.     Pulses: Normal pulses.     Heart sounds: Normal heart sounds. No murmur heard.   No gallop.  Pulmonary:     Effort: Pulmonary effort is normal. No respiratory distress.     Breath sounds: Normal breath sounds. No stridor. No wheezing or rales.     Comments: No crackles  Abdominal:     General: Bowel sounds are normal. There is no distension or abdominal bruit.     Palpations: Abdomen is soft. There is no mass.     Tenderness: There is no abdominal tenderness.  Musculoskeletal:     Cervical back: Normal range of motion and neck supple.     Right lower leg: No edema.     Left lower leg: No edema.  Lymphadenopathy:     Cervical: No cervical adenopathy.  Skin:    General: Skin is warm and dry.     Coloration: Skin is not pale.     Findings: No rash.  Neurological:     Mental Status: She is alert.     Sensory: No sensory deficit.     Coordination: Coordination normal.     Deep Tendon Reflexes: Reflexes are normal and symmetric. Reflexes normal.  Psychiatric:        Attention and Perception: Attention normal.        Mood and Affect: Mood is anxious.        Cognition and Memory: Cognition and memory  normal.      Comments: Anxious  Fairly good insight  Pleasant and attentive Mentally sharp          Assessment & Plan:   Problem List Items Addressed This Visit       Cardiovascular and Mediastinum   Hypertension    bp is much better at home than here  Pt much prefers lisinopril to benicar and thinks it works better  [employer] take hctz (low na), amlodipine (ha) or metoprolol (ha)  Anxiety seems to greatly inc bp  Seen in ER (Reviewed hospital records, lab results and studies in detail )- very reassuring w/u  Checking bp at home seems to consume pt and she worries constantly  bp today is elevated (as usual outside home when anx) and she forgot her buspar Plan to work on anxiety (adding zoloft) and f/u as planned later this mo She will bring her own cuff as well         Other   Anxiety - Primary    Suspect fueling her inc bp  ER visit-panic attack may have worsened symptoms Reviewed stressors/ coping techniques/symptoms/ support sources/ tx options and side effects in detail today Plan to continue buspar and add zoloft 25 inc to 50 mg daily (did well in the past) Discussed expectations of SSRI medication including time to effectiveness and mechanism of action, also poss of side effects (early and late)- including mental fuzziness, weight or appetite change, nausea and poss of worse dep or anxiety (even suicidal thoughts)  Pt voiced understanding and will stop med and update if this occurs   Long disc re: counseling-she will consider it (handout given on CBT)  Handout re: anx Will continue her widow's support group Continue part time job Focus on self care F/u later this mo as planned        Relevant Medications   sertraline (ZOLOFT) 50 MG tablet   Chronic headaches    Reassuring CT- reviewed today  Suspect multiple triggers incl anxiety and HTN and sleep apnea Working on all of these  Re assess at f/u soon  Reassuring exam today       Relevant Medications   sertraline  (ZOLOFT) 50 MG tablet

## 2021-03-31 ENCOUNTER — Other Ambulatory Visit: Payer: Self-pay | Admitting: Family Medicine

## 2021-04-02 ENCOUNTER — Other Ambulatory Visit: Payer: Self-pay | Admitting: Family Medicine

## 2021-04-09 ENCOUNTER — Telehealth: Payer: Self-pay | Admitting: Family Medicine

## 2021-04-09 DIAGNOSIS — I10 Essential (primary) hypertension: Secondary | ICD-10-CM

## 2021-04-09 DIAGNOSIS — R7309 Other abnormal glucose: Secondary | ICD-10-CM

## 2021-04-09 DIAGNOSIS — E559 Vitamin D deficiency, unspecified: Secondary | ICD-10-CM

## 2021-04-09 DIAGNOSIS — R7303 Prediabetes: Secondary | ICD-10-CM

## 2021-04-09 DIAGNOSIS — Z0000 Encounter for general adult medical examination without abnormal findings: Secondary | ICD-10-CM

## 2021-04-09 DIAGNOSIS — E7800 Pure hypercholesterolemia, unspecified: Secondary | ICD-10-CM

## 2021-04-09 NOTE — Telephone Encounter (Signed)
-----   Message from Aquilla Solian, RT sent at 03/26/2021 10:04 AM EDT ----- Regarding: Lab Orders for Tuesday 8.16.2022 Please place lab orders for Tuesday 8.16.2022, office visit for physical on Monday 8.22.2022 Thank you, Jones Bales RT(R)

## 2021-04-10 ENCOUNTER — Other Ambulatory Visit: Payer: Self-pay

## 2021-04-10 ENCOUNTER — Other Ambulatory Visit (INDEPENDENT_AMBULATORY_CARE_PROVIDER_SITE_OTHER): Payer: Medicare HMO

## 2021-04-10 DIAGNOSIS — I10 Essential (primary) hypertension: Secondary | ICD-10-CM | POA: Diagnosis not present

## 2021-04-10 DIAGNOSIS — E7800 Pure hypercholesterolemia, unspecified: Secondary | ICD-10-CM

## 2021-04-10 DIAGNOSIS — R7303 Prediabetes: Secondary | ICD-10-CM | POA: Diagnosis not present

## 2021-04-10 DIAGNOSIS — E559 Vitamin D deficiency, unspecified: Secondary | ICD-10-CM | POA: Diagnosis not present

## 2021-04-10 LAB — COMPREHENSIVE METABOLIC PANEL
ALT: 13 U/L (ref 0–35)
AST: 19 U/L (ref 0–37)
Albumin: 4.2 g/dL (ref 3.5–5.2)
Alkaline Phosphatase: 61 U/L (ref 39–117)
BUN: 10 mg/dL (ref 6–23)
CO2: 27 mEq/L (ref 19–32)
Calcium: 9.7 mg/dL (ref 8.4–10.5)
Chloride: 99 mEq/L (ref 96–112)
Creatinine, Ser: 0.8 mg/dL (ref 0.40–1.20)
GFR: 73.14 mL/min (ref >60.00)
Glucose, Bld: 90 mg/dL (ref 70–99)
Potassium: 4.3 mEq/L (ref 3.5–5.1)
Sodium: 134 mEq/L — ABNORMAL LOW (ref 135–145)
Total Bilirubin: 0.6 mg/dL (ref 0.2–1.2)
Total Protein: 6.9 g/dL (ref 6.0–8.3)

## 2021-04-10 LAB — VITAMIN D 25 HYDROXY (VIT D DEFICIENCY, FRACTURES): VITD: 71.1 ng/mL (ref 30.00–100.00)

## 2021-04-10 LAB — CBC WITH DIFFERENTIAL/PLATELET
Basophils Absolute: 0.1 10*3/uL (ref 0.0–0.1)
Basophils Relative: 1.1 % (ref 0.0–3.0)
Eosinophils Absolute: 0.2 10*3/uL (ref 0.0–0.7)
Eosinophils Relative: 2.7 % (ref 0.0–5.0)
HCT: 39.6 % (ref 36.0–46.0)
Hemoglobin: 13.4 g/dL (ref 12.0–15.0)
Lymphocytes Relative: 24.5 % (ref 12.0–46.0)
Lymphs Abs: 1.8 10*3/uL (ref 0.7–4.0)
MCHC: 33.9 g/dL (ref 30.0–36.0)
MCV: 91.4 fl (ref 78.0–100.0)
Monocytes Absolute: 0.6 10*3/uL (ref 0.1–1.0)
Monocytes Relative: 8.4 % (ref 3.0–12.0)
Neutro Abs: 4.7 10*3/uL (ref 1.4–7.7)
Neutrophils Relative %: 63.3 % (ref 43.0–77.0)
Platelets: 261 10*3/uL (ref 150.0–400.0)
RBC: 4.34 Mil/uL (ref 3.87–5.11)
RDW: 12.8 % (ref 11.5–15.5)
WBC: 7.3 10*3/uL (ref 4.0–10.5)

## 2021-04-10 LAB — LIPID PANEL
Cholesterol: 135 mg/dL (ref 0–200)
HDL: 55.6 mg/dL (ref >39.00)
LDL Cholesterol: 64 mg/dL (ref 0–99)
NonHDL: 79.67
Total CHOL/HDL Ratio: 2
Triglycerides: 79 mg/dL (ref 0.0–149.0)
VLDL: 15.8 mg/dL (ref 0.0–40.0)

## 2021-04-10 LAB — HEMOGLOBIN A1C: Hgb A1c MFr Bld: 5.5 % (ref 4.6–6.5)

## 2021-04-10 LAB — TSH: TSH: 2.14 u[IU]/mL (ref 0.35–5.50)

## 2021-04-11 ENCOUNTER — Ambulatory Visit: Payer: Medicare HMO

## 2021-04-16 ENCOUNTER — Other Ambulatory Visit: Payer: Self-pay

## 2021-04-16 ENCOUNTER — Ambulatory Visit (INDEPENDENT_AMBULATORY_CARE_PROVIDER_SITE_OTHER): Payer: Medicare HMO | Admitting: Family Medicine

## 2021-04-16 ENCOUNTER — Encounter: Payer: Self-pay | Admitting: Family Medicine

## 2021-04-16 VITALS — BP 166/90 | HR 79 | Temp 98.6°F | Resp 20 | Ht 62.25 in | Wt 161.7 lb

## 2021-04-16 DIAGNOSIS — G4730 Sleep apnea, unspecified: Secondary | ICD-10-CM

## 2021-04-16 DIAGNOSIS — E559 Vitamin D deficiency, unspecified: Secondary | ICD-10-CM | POA: Diagnosis not present

## 2021-04-16 DIAGNOSIS — E7800 Pure hypercholesterolemia, unspecified: Secondary | ICD-10-CM | POA: Diagnosis not present

## 2021-04-16 DIAGNOSIS — R7303 Prediabetes: Secondary | ICD-10-CM

## 2021-04-16 DIAGNOSIS — R69 Illness, unspecified: Secondary | ICD-10-CM | POA: Diagnosis not present

## 2021-04-16 DIAGNOSIS — Z0000 Encounter for general adult medical examination without abnormal findings: Secondary | ICD-10-CM | POA: Diagnosis not present

## 2021-04-16 DIAGNOSIS — F419 Anxiety disorder, unspecified: Secondary | ICD-10-CM

## 2021-04-16 DIAGNOSIS — I10 Essential (primary) hypertension: Secondary | ICD-10-CM | POA: Diagnosis not present

## 2021-04-16 DIAGNOSIS — M8580 Other specified disorders of bone density and structure, unspecified site: Secondary | ICD-10-CM

## 2021-04-16 NOTE — Assessment & Plan Note (Signed)
Some improvement with addn of zoloft 50 mg daily Continues buspar 15 mg bid (usually takes once daily) Encourage self care  F/u in about a month for this and HTN  Anxious mood may add to elevated bp

## 2021-04-16 NOTE — Assessment & Plan Note (Signed)
Last dexa in 2018 was in nl range Will re check this at 5 years No falls or fractures Good D level Good exercise

## 2021-04-16 NOTE — Assessment & Plan Note (Signed)
bp is still elevated but in the setting of uncontrolled anxiety  Stopped checking bp at home since it made her severely anxious as well  BP: (!) 166/90    Taking lisinopril  Intolerant to benicar  hctz causes hyponatremia  Side eff to amlodipine  Plan to f/u 1 mo to see if this improves with further improvement of anxiety

## 2021-04-16 NOTE — Assessment & Plan Note (Signed)
Lab Results  Component Value Date   HGBA1C 5.5 04/10/2021   Stable/well controlled disc imp of low glycemic diet and wt loss to prevent DM2

## 2021-04-16 NOTE — Patient Instructions (Addendum)
Get your flu shot in the fall   If you are interested in the new shingles vaccine (Shingrix) - call your local pharmacy to check on coverage and availability  If affordable, get on a wait list at your pharmacy to get the vaccine.   See pulmonary as planned for sleep apnea   I think your blood pressure cuff runs a little high   Follow up in about a month   Eat a healthy diet and exercise when you can

## 2021-04-16 NOTE — Assessment & Plan Note (Signed)
For pulmonary f/u next week Treatment may help blood pressure issues significantly

## 2021-04-16 NOTE — Assessment & Plan Note (Signed)
Disc goals for lipids and reasons to control them Rev last labs with pt Rev low sat fat diet in detail Good control with simvastatin 20 mg daily and diet  LDL of 64

## 2021-04-16 NOTE — Progress Notes (Signed)
Subjective:    Patient ID: Adriana Marks, female    DOB: January 25, 1946, 75 y.o.   MRN: [id number]  This visit occurred during the SARS-CoV-2 public health emergency.  Safety protocols were in place, including screening questions prior to the visit, additional usage of staff PPE, and extensive cleaning of exam room while observing appropriate contact time as indicated for disinfecting solutions.    HPI Here for health maintenance exam and to review chronic medical problems    Wt Readings from Last 3 Encounters:  04/16/21 161 lb 11.2 oz (73.3 kg)  03/27/21 167 lb (75.8 kg)  03/05/21 150 lb (68 kg)   29.34 kg/m  Doing ok  Tolerating zoloft so far  Helping a bit with anixety     Amw is planned next month    Covid vaccinated with booster Tdap 5/12  Flu shot-fall  Zoster status -interested , would cost 47$ , may get it  Pna vaccines complete   Mammogram 04/2020 Planned 05/01/21 Self breast exam- no lumps or changes   Colonoscopy -6/22  Not long ago    Dexa 5/18 -normal  Osteopenia in the past Falls -none Fractures -none D level good at 71.1 Walking and some exercises inside also  Still working    HTN  Pt inst to stop checking due to anxiety   BP Readings from Last 3 Encounters:  04/16/21 (!) 166/90  03/27/21 (!) 170/96  03/05/21 (!) 201/87    Lisinopril 20 mg daily (prev benicar and did not like it) Intolerant to other meds and gets low na with diuretic    Lab Results  Component Value Date   CREATININE 0.80 04/10/2021   BUN 10 04/10/2021   NA 134 (L) 04/10/2021   K 4.3 04/10/2021   CL 99 04/10/2021   CO2 27 04/10/2021   Sodium is stable   Lab Results  Component Value Date   WBC 7.3 04/10/2021   HGB 13.4 04/10/2021   HCT 39.6 04/10/2021   MCV 91.4 04/10/2021   PLT 261.0 04/10/2021   Lab Results  Component Value Date   TSH 2.14 04/10/2021     OSA Has appt on 8/28 for evaluation with pulm/ Dr Belia Heman   Mood (anxiety and depression)  Buspar 15  mg bid  (is taking one a day) Added zoloft recently 50 mg daily    Hyperlipideima Lab Results  Component Value Date   CHOL 135 04/10/2021   CHOL 154 04/05/2020   CHOL 145 03/30/2019   Lab Results  Component Value Date   HDL 55.60 04/10/2021   HDL 60.10 04/05/2020   HDL 55.40 03/30/2019   Lab Results  Component Value Date   LDLCALC 64 04/10/2021   LDLCALC 80 04/05/2020   LDLCALC 73 03/30/2019   Lab Results  Component Value Date   TRIG 79.0 04/10/2021   TRIG 72.0 04/05/2020   TRIG 84.0 03/30/2019   Lab Results  Component Value Date   CHOLHDL 2 04/10/2021   CHOLHDL 3 04/05/2020   CHOLHDL 3 03/30/2019   No results found for: LDLDIRECT Simvastatin 20 mg daily  Doing well with diet- very good  LDL is improved    Elevated glucose /prediabetes  Lab Results  Component Value Date   HGBA1C 5.5 04/10/2021  Good/stable   Patient Active Problem List   Diagnosis Date Noted   Sleep apnea 01/29/2021   Chronic headaches 12/07/2020   Hyponatremia 04/12/2020   Screening for colon cancer 10/29/2019   Stress reaction 08/31/2019  Breast cancer screening by mammogram 12/08/2017   Estrogen deficiency 12/04/2016   Grief reaction 09/09/2014   Adjustment disorder with depressed mood 09/09/2014   Encounter for Medicare annual wellness exam 09/06/2013   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   Prediabetes 08/12/2012   Other screening mammogram 03/22/2011   Hyperlipidemia 01/01/2011   Hypertension 01/01/2011   GERD (gastroesophageal reflux disease) 01/01/2011   Routine general medical examination at a health care facility 01/01/2011   Vitamin D deficiency disease 01/01/2011   Osteopenia 01/01/2011   Anxiety 01/01/2011   Allergic rhinitis 01/01/2011   Rosacea 01/01/2011   Disorder of bone and cartilage 01/01/2011   Past Medical History:  Diagnosis Date   Allergy    Bronchitis    Colon polyps    Depression    GERD (gastroesophageal reflux disease)     Heart murmur    History of chicken pox    Hx: UTI (urinary tract infection)    Hyperlipidemia    Hypertension    Migraines    PONV (postoperative nausea and vomiting)    Rosacea    Sleep apnea    uses cpap every night    Past Surgical History:  Procedure Laterality Date   COLONOSCOPY WITH PROPOFOL N/A 01/25/2021   Procedure: COLONOSCOPY WITH PROPOFOL;  Surgeon: Toney ReilVanga, Rohini Reddy, MD;  Location: [facility];  Service: Gastroenterology;  Laterality: N/A;   DNC     FOOT SURGERY     Social History   Tobacco Use   Smoking status: Former   Smokeless tobacco: Never  Vaping Use   Vaping Use: Never used  Substance Use Topics   Alcohol use: Yes    Alcohol/week: 0.0 standard drinks    Comment: wine occasional   Drug use: No   Family History  Problem Relation Age of Onset   Heart disease Father    Hypertension Father    Diabetes Other    Heart disease Mother    Stroke Paternal Grandmother    Heart disease Paternal Grandfather    Hypertension Paternal Grandfather    Breast cancer Paternal Aunt    Breast cancer Sister    Cancer Sister    Bladder Cancer Neg Hx    Kidney cancer Neg Hx    Allergies  Allergen Reactions   Shellfish Allergy Anaphylaxis    Sob/ swelling  Sob/ swelling    Amlodipine     headache   Current Outpatient Medications on File Prior to Visit  Medication Sig Dispense Refill   aspirin 81 MG tablet Take 81 mg by mouth daily.     busPIRone (BUSPAR) 15 MG tablet TAKE 1 TABLET BY MOUTH TWICE A DAY (Patient taking differently: 15 mg daily.) 180 tablet 0   EPINEPHrine (EPI-PEN) 0.3 mg/0.3 mL SOAJ injection Inject 0.3 mLs (0.3 mg total) into the muscle once. For allergic reaction 1 Device 3   lisinopril (ZESTRIL) 20 MG tablet Take 1 tablet (20 mg total) by mouth daily. 90 tablet 3   loratadine (CLARITIN) 10 MG tablet Take 10 mg by mouth daily.     metroNIDAZOLE (METROGEL) 1 % gel Apply topically 2 (two) times daily. 180 g 3   omeprazole (PRILOSEC) 20 MG  capsule TAKE 1 CAPSULE BY MOUTH TWICE A DAY 180 capsule 0   sertraline (ZOLOFT) 50 MG tablet Take 1 tablet (50 mg total) by mouth daily. 30 tablet 3   simvastatin (ZOCOR) 20 MG tablet TAKE 1 TABLET (20 MG TOTAL) BY MOUTH DAILY AT 6  PM. 90 tablet 0   benzonatate (TESSALON PERLES) 100 MG capsule Take 1 capsule (100 mg total) by mouth 3 (three) times daily as needed. 20 capsule 0   No current facility-administered medications on file prior to visit.      Review of Systems  Constitutional:  Negative for activity change, appetite change, fatigue, fever and unexpected weight change.  HENT:  Negative for congestion, ear pain, rhinorrhea, sinus pressure and sore throat.   Eyes:  Negative for pain, redness and visual disturbance.  Respiratory:  Negative for cough, shortness of breath and wheezing.   Cardiovascular:  Negative for chest pain and palpitations.  Gastrointestinal:  Negative for abdominal pain, blood in stool, constipation and diarrhea.  Endocrine: Negative for polydipsia and polyuria.  Genitourinary:  Negative for dysuria, frequency and urgency.  Musculoskeletal:  Negative for arthralgias, back pain and myalgias.  Skin:  Negative for pallor and rash.  Allergic/Immunologic: Negative for environmental allergies.  Neurological:  Negative for dizziness, syncope and headaches.  Hematological:  Negative for adenopathy. Does not bruise/bleed easily.  Psychiatric/Behavioral:  Positive for dysphoric mood. Negative for decreased concentration. The patient is nervous/anxious.       Objective:   Physical Exam Constitutional:      General: She is not in acute distress.    Appearance: Normal appearance. She is well-developed. She is not ill-appearing or diaphoretic.     Comments: Overweight   HENT:     Head: Normocephalic and atraumatic.     Right Ear: Tympanic membrane, ear canal and external ear normal.     Left Ear: Tympanic membrane, ear canal and external ear normal.     Nose: Nose  normal. No congestion.     Mouth/Throat:     Mouth: Mucous membranes are moist.     Pharynx: Oropharynx is clear. No posterior oropharyngeal erythema.  Eyes:     General: No scleral icterus.    Extraocular Movements: Extraocular movements intact.     Conjunctiva/sclera: Conjunctivae normal.     Pupils: Pupils are equal, round, and reactive to light.  Neck:     Thyroid: No thyromegaly.     Vascular: No carotid bruit or JVD.  Cardiovascular:     Rate and Rhythm: Normal rate and regular rhythm.     Pulses: Normal pulses.     Heart sounds: Normal heart sounds.    No gallop.  Pulmonary:     Effort: Pulmonary effort is normal. No respiratory distress.     Breath sounds: Normal breath sounds. No wheezing.     Comments: Good air exch Chest:     Chest wall: No tenderness.  Abdominal:     General: Bowel sounds are normal. There is no distension or abdominal bruit.     Palpations: Abdomen is soft. There is no mass.     Tenderness: There is no abdominal tenderness.     Hernia: No hernia is present.  Genitourinary:    Comments: Breast exam: No mass, nodules, thickening, tenderness, bulging, retraction, inflamation, nipple discharge or skin changes noted.  No axillary or clavicular LA.     Musculoskeletal:        General: No tenderness. Normal range of motion.     Cervical back: Normal range of motion and neck supple. No rigidity. No muscular tenderness.     Right lower leg: No edema.     Left lower leg: No edema.     Comments: No kyphosis   Lymphadenopathy:     Cervical: No cervical adenopathy.  Skin:    General: Skin is warm and dry.     Coloration: Skin is not pale.     Findings: No erythema or rash.     Comments: Solar lentigines diffusely  Many scattered SKs on trunk  Neurological:     Mental Status: She is alert. Mental status is at baseline.     Cranial Nerves: No cranial nerve deficit.     Motor: No abnormal muscle tone.     Coordination: Coordination normal.     Gait:  Gait normal.     Deep Tendon Reflexes: Reflexes are normal and symmetric. Reflexes normal.  Psychiatric:        Mood and Affect: Mood normal.        Cognition and Memory: Cognition and memory normal.          Assessment & Plan:   Problem List Items Addressed This Visit       Cardiovascular and Mediastinum   Hypertension    bp is still elevated but in the setting of uncontrolled anxiety  Stopped checking bp at home since it made her severely anxious as well  BP: (!) 166/90    Taking lisinopril  Intolerant to benicar  hctz causes hyponatremia  Side eff to amlodipine  Plan to f/u 1 mo to see if this improves with further improvement of anxiety        Respiratory   Sleep apnea    For pulmonary f/u next week Treatment may help blood pressure issues significantly        Musculoskeletal and Integument   Osteopenia    Last dexa in 2018 was in nl range Will re check this at 5 years No falls or fractures Good D level Good exercise         Other   Hyperlipidemia    Disc goals for lipids and reasons to control them Rev last labs with pt Rev low sat fat diet in detail Good control with simvastatin 20 mg daily and diet  LDL of 64       Routine general medical examination at a health care facility - Primary    Reviewed health habits including diet and exercise and skin cancer prevention Reviewed appropriate screening tests for age  Also reviewed health mt list, fam hx and immunization status , as well as social and family history   See HPI Labs reviewed  covid vaccinated Plans flu shot in the fall  Plans to get shingrix /is covered well enough Mammogram utd- due next month and already scheduled  Colonoscopy utd Nl dexa in 2018 No falls or fractures       Vitamin D deficiency disease    Vitamin D level is therapeutic with current supplementation Disc importance of this to bone and overall health Level of 71 Enc to continue current supplementation        Anxiety    Some improvement with addn of zoloft 50 mg daily Continues buspar 15 mg bid (usually takes once daily) Encourage self care  F/u in about a month for this and HTN  Anxious mood may add to elevated bp      Prediabetes    Lab Results  Component Value Date   HGBA1C 5.5 04/10/2021  Stable/well controlled disc imp of low glycemic diet and wt loss to prevent DM2

## 2021-04-16 NOTE — Assessment & Plan Note (Signed)
Vitamin D level is therapeutic with current supplementation Disc importance of this to bone and overall health Level of 71 Enc to continue current supplementation

## 2021-04-16 NOTE — Assessment & Plan Note (Signed)
Reviewed health habits including diet and exercise and skin cancer prevention Reviewed appropriate screening tests for age  Also reviewed health mt list, fam hx and immunization status , as well as social and family history   See HPI Labs reviewed  covid vaccinated Plans flu shot in the fall  Plans to get shingrix /is covered well enough Mammogram utd- due next month and already scheduled  Colonoscopy utd Nl dexa in 2018 No falls or fractures

## 2021-04-18 ENCOUNTER — Other Ambulatory Visit: Payer: Self-pay | Admitting: Family Medicine

## 2021-05-01 ENCOUNTER — Ambulatory Visit
Admission: RE | Admit: 2021-05-01 | Discharge: 2021-05-01 | Disposition: A | Discharge status: Home / Self Care | Payer: Medicare HMO | Source: Ambulatory Visit | Attending: Family Medicine | Admitting: Family Medicine

## 2021-05-01 ENCOUNTER — Other Ambulatory Visit: Payer: Self-pay

## 2021-05-01 DIAGNOSIS — Z1231 Encounter for screening mammogram for malignant neoplasm of breast: Secondary | ICD-10-CM | POA: Diagnosis not present

## 2021-05-15 ENCOUNTER — Ambulatory Visit: Payer: Medicare HMO | Admitting: Gastroenterology

## 2021-05-16 ENCOUNTER — Encounter: Payer: Self-pay | Admitting: Internal Medicine

## 2021-05-16 ENCOUNTER — Ambulatory Visit (INDEPENDENT_AMBULATORY_CARE_PROVIDER_SITE_OTHER): Payer: Medicare HMO | Admitting: Internal Medicine

## 2021-05-16 ENCOUNTER — Other Ambulatory Visit: Payer: Self-pay

## 2021-05-16 VITALS — BP 156/100 | HR 82 | Temp 98.2°F | Ht 62.0 in | Wt 161.0 lb

## 2021-05-16 DIAGNOSIS — G4719 Other hypersomnia: Secondary | ICD-10-CM

## 2021-05-16 NOTE — Patient Instructions (Signed)
Obtain home sleep study to assess for sleep apnea

## 2021-05-16 NOTE — Progress Notes (Signed)
Name: Adriana Marks MRN: [id number] DOB: 23-Jun-1946     CONSULTATION DATE: 05/16/2021  REFERRING MD : Milinda Antis  CHIEF COMPLAINT:  Assessment for excessive daytime sleepiness   HISTORY OF PRESENT ILLNESS:  Patient is seen today for problems and issues with sleep related to excessive daytime sleepiness Patient  has been having sleep problems for many years Patient has been having excessive daytime sleepiness for a long time Patient has been having extreme fatigue and tiredness, lack of energy +  very Loud snoring every night + struggling breathe at night and gasps for air Previous DX of OSA  Discussed sleep data and reviewed with patient.  Encouraged proper weight management.  Discussed driving precautions and its relationship with hypersomnolence.  Discussed operating dangerous equipment and its relationship with hypersomnolence.  Discussed sleep hygiene, and benefits of a fixed sleep waked time.  The importance of getting eight or more hours of sleep discussed with patient.  Discussed limiting the use of the computer and television before bedtime.  Decrease naps during the day, so night time sleep will become enhanced.  Limit caffeine, and sleep deprivation.  HTN, stroke, and heart failure are potential risk factors.    EPWORTH SLEEP SCORE 5    PAST MEDICAL HISTORY :   has a past medical history of Allergy, Bronchitis, Colon polyps, Depression, GERD (gastroesophageal reflux disease), Heart murmur, History of chicken pox, UTI (urinary tract infection), Hyperlipidemia, Hypertension, Migraines, PONV (postoperative nausea and vomiting), Rosacea, and Sleep apnea.  has a past surgical history that includes Foot surgery; DNC; and Colonoscopy with propofol (N/A, 01/25/2021). Prior to Admission medications   Medication Sig Start Date End Date Taking? Authorizing Provider  aspirin 81 MG tablet Take 81 mg by mouth daily.    [provider]  benzonatate (TESSALON PERLES) 100 MG  capsule Take 1 capsule (100 mg total) by mouth 3 (three) times daily as needed. 03/18/21   Jannifer Rodney A, FNP  busPIRone (BUSPAR) 15 MG tablet TAKE 1 TABLET BY MOUTH TWICE A DAY Patient taking differently: 15 mg daily. 04/03/21   [name], [name], MD  EPINEPHrine (EPI-PEN) 0.3 mg/0.3 mL SOAJ injection Inject 0.3 mLs (0.3 mg total) into the muscle once. For allergic reaction 04/27/13   [name], [name], MD  lisinopril (ZESTRIL) 20 MG tablet Take 1 tablet (20 mg total) by mouth daily. 03/06/21   [name], [name], MD  loratadine (CLARITIN) 10 MG tablet Take 10 mg by mouth daily.    [provider]  metroNIDAZOLE (METROGEL) 1 % gel Apply topically 2 (two) times daily. 08/31/19   [name], [name], MD  omeprazole (PRILOSEC) 20 MG capsule TAKE 1 CAPSULE BY MOUTH TWICE A DAY 04/02/21   [name], [place] A, MD  sertraline (ZOLOFT) 50 MG tablet TAKE 1 TABLET BY MOUTH EVERY DAY 04/18/21   [name], [name], MD  simvastatin (ZOCOR) 20 MG tablet TAKE 1 TABLET (20 MG TOTAL) BY MOUTH DAILY AT 6 PM. 04/02/21   [name], [name], MD   Allergies  Allergen Reactions   Shellfish Allergy Anaphylaxis    Sob/ swelling  Sob/ swelling    Amlodipine     headache    FAMILY HISTORY:  family history includes Breast cancer in her paternal aunt and sister; Cancer in her sister; Diabetes in an other family member; Heart disease in her father, mother, and paternal grandfather; Hypertension in her father and paternal grandfather; Stroke in her paternal grandmother. SOCIAL HISTORY:  reports that she has quit smoking. She  has never used smokeless tobacco. She reports current alcohol use. She reports that she does not use drugs.   Review of Systems:  Gen:  Denies  fever, sweats, chills weight loss  HEENT: Denies blurred vision, double vision, ear pain, eye pain, hearing loss, nose bleeds, sore throat Cardiac:  No dizziness, chest pain or heaviness, chest tightness,edema, No JVD Resp:   No cough, -sputum production, -shortness of  breath,-wheezing, -hemoptysis,  Gi: Denies swallowing difficulty, stomach pain, nausea or vomiting, diarrhea, constipation, bowel incontinence Gu:  Denies bladder incontinence, burning urine Ext:   Denies Joint pain, stiffness or swelling Skin: Denies  skin rash, easy bruising or bleeding or hives Endoc:  Denies polyuria, polydipsia , polyphagia or weight change Psych:   Denies depression, insomnia or hallucinations  Other:  All other systems negative   ALL OTHER ROS ARE NEGATIVE   BP (!) 156/100 (BP Location: Left Arm, Patient Position: Sitting, Cuff Size: Normal)   Pulse 82   Temp 98.2 F (36.8 C) (Oral)   Ht 5\' 2"  (1.575 m)   Wt 161 lb (73 kg)   LMP 08/26/1997   SpO2 99%   BMI 29.45 kg/m     Physical Examination:   General Appearance: No distress  EYES PERRLA, EOM intact.   NECK Supple, No JVD Pulmonary: normal breath sounds, No wheezing.  CardiovascularNormal S1,S2.  No m/r/g.   Abdomen: Benign, Soft, non-tender. Skin:   warm, no rashes, no ecchymosis  Extremities: normal, no cyanosis, clubbing. Neuro:without focal findings,  speech normal  PSYCHIATRIC: Mood, affect within normal limits.   ALL OTHER ROS ARE NEGATIVE       ASSESSMENT AND PLAN SYNOPSIS 75 year old pleasant female seen today for assessment for underlying excessive daytime sleepiness with a previous diagnosis of obstructive sleep apnea  At this time patient needs definitive diagnosis with home sleep study Most likely will need CPAP for therapy     MEDICATION ADJUSTMENTS/LABS AND TESTS ORDERED: HST ordered   CURRENT MEDICATIONS REVIEWED AT LENGTH WITH PATIENT TODAY   Patient  satisfied with Plan of action and management. All questions answered  Follow up 3-6 months  Total Time Spent  35 mins   [address] B[address]berg, M.D.  [facility]  Medical Director [facility] Medical Director [facility] Cardio-Pulmonary Department

## 2021-05-17 ENCOUNTER — Ambulatory Visit (INDEPENDENT_AMBULATORY_CARE_PROVIDER_SITE_OTHER): Payer: Medicare HMO | Admitting: Family Medicine

## 2021-05-17 ENCOUNTER — Encounter: Payer: Self-pay | Admitting: Family Medicine

## 2021-05-17 VITALS — BP 138/78

## 2021-05-17 DIAGNOSIS — F419 Anxiety disorder, unspecified: Secondary | ICD-10-CM

## 2021-05-17 DIAGNOSIS — G4730 Sleep apnea, unspecified: Secondary | ICD-10-CM | POA: Diagnosis not present

## 2021-05-17 DIAGNOSIS — I10 Essential (primary) hypertension: Secondary | ICD-10-CM

## 2021-05-17 DIAGNOSIS — R69 Illness, unspecified: Secondary | ICD-10-CM | POA: Diagnosis not present

## 2021-05-17 NOTE — Patient Instructions (Signed)
So glad your blood pressure has improved Continue the lisinopril Also medications for anxiety   Take care of yourself and exercise when you can   Go forward with the sleep study

## 2021-05-17 NOTE — Progress Notes (Signed)
Virtual Visit via Telephone Note  I connected with Adriana Marks on 05/17/21 at 10:30 AM EDT by telephone and verified that I am speaking with the correct person using two identifiers.  Location: Patient: home Provider: office   I discussed the limitations, risks, security and privacy concerns of performing an evaluation and management service by telephone and the availability of in person appointments. I also discussed with the patient that there may be a patient responsible charge related to this service. The patient expressed understanding and agreed to proceed.  Parties involved in encounter  Patient: Adriana Marks  Provider:  Roxy Manns MD   History of Present Illness: Pt presents for f/u of HTN  Wt Readings from Last 3 Encounters:  05/16/21 161 lb (73 kg)  04/16/21 161 lb 11.2 oz (73.3 kg)  03/27/21 167 lb (75.8 kg)   HTN Pt saw pulmonary for a visit to discuss sleep apnea and her bp was up at 156/100  Pulse 82 Putting together a home sleep study soon   No cp or palpitations or headaches or edema  No side effects to medicines  Today at home it is improved BP Readings from Last 3 Encounters:  05/17/21 138/78  05/16/21 (!) 156/100  04/16/21 (!) 166/90   128/70s last night as home   Pulse Readings from Last 3 Encounters:  05/16/21 82  04/16/21 79  03/27/21 77    Taking lisinopril 20 mg daily   In the past intolerant to benicar and amlodipine Hctz causes hyponatremia   Bp tends to go up with anxiety  Some white coat HTN issues    Anxiety/GAD Taking zoloft 50 mg daily  Buspar 15 mg bid   Doing better, able to cope with stressors instead of dwelling on the problem Able to distract herself well  Some stress- she had a call that her niece was in the hospital right before the pulmonary visit   Lab Results  Component Value Date   CREATININE 0.80 04/10/2021   BUN 10 04/10/2021   NA 134 (L) 04/10/2021   K 4.3 04/10/2021   CL 99 04/10/2021   CO2 27  04/10/2021   Patient Active Problem List   Diagnosis Date Noted   Sleep apnea 01/29/2021   Chronic headaches 12/07/2020   Hyponatremia 04/12/2020   Screening for colon cancer 10/29/2019   Stress reaction 08/31/2019   Breast cancer screening by mammogram 12/08/2017   Estrogen deficiency 12/04/2016   Grief reaction 09/09/2014   Adjustment disorder with depressed mood 09/09/2014   Encounter for Medicare annual wellness exam 09/06/2013   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   Prediabetes 08/12/2012   Other screening mammogram 03/22/2011   Hyperlipidemia 01/01/2011   Hypertension 01/01/2011   GERD (gastroesophageal reflux disease) 01/01/2011   Routine general medical examination at a health care facility 01/01/2011   Vitamin D deficiency disease 01/01/2011   Osteopenia 01/01/2011   Anxiety 01/01/2011   Allergic rhinitis 01/01/2011   Rosacea 01/01/2011   Disorder of bone and cartilage 01/01/2011   Past Medical History:  Diagnosis Date   Allergy    Bronchitis    Colon polyps    Depression    GERD (gastroesophageal reflux disease)    Heart murmur    History of chicken pox    Hx: UTI (urinary tract infection)    Hyperlipidemia    Hypertension    Migraines    PONV (postoperative nausea and vomiting)    Rosacea    Sleep  apnea    uses cpap every night    Past Surgical History:  Procedure Laterality Date   COLONOSCOPY WITH PROPOFOL N/A 01/25/2021   Procedure: COLONOSCOPY WITH PROPOFOL;  Surgeon: Toney Reil, MD;  Location: [facility] ENDOSCOPY;  Service: Gastroenterology;  Laterality: N/A;   DNC     FOOT SURGERY     Social History   Tobacco Use   Smoking status: Former   Smokeless tobacco: Never  Vaping Use   Vaping Use: Never used  Substance Use Topics   Alcohol use: Yes    Alcohol/week: 0.0 standard drinks    Comment: wine occasional   Drug use: No   Family History  Problem Relation Age of Onset   Heart disease Father    Hypertension  Father    Diabetes Other    Heart disease Mother    Stroke Paternal Grandmother    Heart disease Paternal Grandfather    Hypertension Paternal Grandfather    Breast cancer Paternal Aunt    Breast cancer Sister    Cancer Sister    Bladder Cancer Neg Hx    Kidney cancer Neg Hx    Allergies  Allergen Reactions   Shellfish Allergy Anaphylaxis    Sob/ swelling  Sob/ swelling    Amlodipine     headache   Current Outpatient Medications on File Prior to Visit  Medication Sig Dispense Refill   aspirin 81 MG tablet Take 81 mg by mouth daily.     benzonatate (TESSALON PERLES) 100 MG capsule Take 1 capsule (100 mg total) by mouth 3 (three) times daily as needed. 20 capsule 0   busPIRone (BUSPAR) 15 MG tablet TAKE 1 TABLET BY MOUTH TWICE A DAY (Patient taking differently: 15 mg daily.) 180 tablet 0   EPINEPHrine (EPI-PEN) 0.3 mg/0.3 mL SOAJ injection Inject 0.3 mLs (0.3 mg total) into the muscle once. For allergic reaction 1 Device 3   lisinopril (ZESTRIL) 20 MG tablet Take 1 tablet (20 mg total) by mouth daily. 90 tablet 3   loratadine (CLARITIN) 10 MG tablet Take 10 mg by mouth daily.     metroNIDAZOLE (METROGEL) 1 % gel Apply topically 2 (two) times daily. 180 g 3   omeprazole (PRILOSEC) 20 MG capsule TAKE 1 CAPSULE BY MOUTH TWICE A DAY 180 capsule 0   sertraline (ZOLOFT) 50 MG tablet TAKE 1 TABLET BY MOUTH EVERY DAY 90 tablet 0   simvastatin (ZOCOR) 20 MG tablet TAKE 1 TABLET (20 MG TOTAL) BY MOUTH DAILY AT 6 PM. 90 tablet 0   No current facility-administered medications on file prior to visit.    Review of Systems  Constitutional:  Negative for chills, fever and malaise/fatigue.       Some fatigue from interrupted sleep  HENT:  Negative for congestion, ear pain, sinus pain and sore throat.   Eyes:  Negative for blurred vision, discharge and redness.  Respiratory:  Negative for cough, shortness of breath and stridor.   Cardiovascular:  Negative for chest pain, palpitations and leg  swelling.  Gastrointestinal:  Negative for abdominal pain, diarrhea, nausea and vomiting.  Musculoskeletal:  Negative for myalgias.  Skin:  Negative for rash.  Neurological:  Negative for dizziness and headaches.  Psychiatric/Behavioral:  The patient is nervous/anxious and has insomnia.        Anxiety is improved     Observations/Objective: Pt sounds like her usual self  Nl cognition/good historian Mood is good/not overly anxious  No coughing or sob noted    Assessment  and Plan: Problem List Items Addressed This Visit       Cardiovascular and Mediastinum   Hypertension - Primary    bp is improved at home (when not anxious)  BP: 138/78  Plan to continue lisinopril 20 mg daily (tolerates well)  Disc lifestyle habits and low sodium diet  Upcoming sleep study and tx for OSA should help also        Respiratory   Sleep apnea    Pt saw pulmonary and in process of setting up home sleep study        Other   Anxiety    Quite a bit better with current medication  Sertraline 50 mg daily  buspar 15 mg daily  Reviewed stressors/ coping techniques/symptoms/ support sources/ tx options and side effects in detail today Encouraged good self care BP is much better when not anxious           Follow Up Instructions: So glad your blood pressure has improved Continue the lisinopril Also medications for anxiety   Take care of yourself and exercise when you can   Go forward with the sleep study   I discussed the assessment and treatment plan with the patient. The patient was provided an opportunity to ask questions and all were answered. The patient agreed with the plan and demonstrated an understanding of the instructions.   The patient was advised to call back or seek an in-person evaluation if the symptoms worsen or if the condition fails to improve as anticipated.  I provided 17 minutes of non-face-to-face time during this encounter.   Roxy Manns, MD

## 2021-05-17 NOTE — Assessment & Plan Note (Signed)
Pt saw pulmonary and in process of setting up home sleep study

## 2021-05-17 NOTE — Assessment & Plan Note (Addendum)
bp is improved at home (when not anxious)  BP: 138/78  Plan to continue lisinopril 20 mg daily (tolerates well)  Disc lifestyle habits and low sodium diet  Upcoming sleep study and tx for OSA should help also

## 2021-05-17 NOTE — Assessment & Plan Note (Signed)
Quite a bit better with current medication  Sertraline 50 mg daily  buspar 15 mg daily  Reviewed stressors/ coping techniques/symptoms/ support sources/ tx options and side effects in detail today Encouraged good self care BP is much better when not anxious

## 2021-05-22 ENCOUNTER — Ambulatory Visit: Payer: Medicare HMO

## 2021-06-22 ENCOUNTER — Ambulatory Visit (INDEPENDENT_AMBULATORY_CARE_PROVIDER_SITE_OTHER): Payer: Medicare HMO

## 2021-06-22 ENCOUNTER — Other Ambulatory Visit: Payer: Self-pay

## 2021-06-22 DIAGNOSIS — G4719 Other hypersomnia: Secondary | ICD-10-CM

## 2021-06-22 DIAGNOSIS — G4733 Obstructive sleep apnea (adult) (pediatric): Secondary | ICD-10-CM | POA: Diagnosis not present

## 2021-06-28 DIAGNOSIS — G4733 Obstructive sleep apnea (adult) (pediatric): Secondary | ICD-10-CM | POA: Diagnosis not present

## 2021-06-29 ENCOUNTER — Other Ambulatory Visit: Payer: Self-pay | Admitting: Family Medicine

## 2021-07-18 ENCOUNTER — Telehealth: Payer: Medicare HMO | Admitting: Family

## 2021-07-18 ENCOUNTER — Encounter: Payer: Self-pay | Admitting: Family Medicine

## 2021-07-18 DIAGNOSIS — R6889 Other general symptoms and signs: Secondary | ICD-10-CM | POA: Diagnosis not present

## 2021-07-18 MED ORDER — OSELTAMIVIR PHOSPHATE 75 MG PO CAPS: 75.0000 mg | ORAL_CAPSULE | Freq: Two times a day (BID) | ORAL | 0 refills | Status: DC

## 2021-07-18 NOTE — Progress Notes (Signed)
E visit for Flu like symptoms   We are sorry that you are not feeling well.  Here is how we plan to help! Based on what you have shared with me it looks like you may have a respiratory virus that may be influenza.  Influenza or "the flu" is   an infection caused by a respiratory virus. The flu virus is highly contagious and persons who did not receive their yearly flu vaccination may "catch" the flu from close contact.  We have anti-viral medications to treat the viruses that cause this infection. They are not a "cure" and only shorten the course of the infection. These prescriptions are most effective when they are given within the first 2 days of "flu" symptoms. Antiviral medication are indicated if you have a high risk of complications from the flu. You should  also consider an antiviral medication if you are in close contact with someone who is at risk. These medications can help patients avoid complications from the flu  but have side effects that you should know. Possible side effects from Tamiflu or oseltamivir include nausea, vomiting, diarrhea, dizziness, headaches, eye redness, sleep problems or other respiratory symptoms. You should not take Tamiflu if you have an allergy to oseltamivir or any to the ingredients in Tamiflu.  Based upon your symptoms and potential risk factors I have prescribed Oseltamivir (Tamiflu).  It has been sent to your designated pharmacy.  You will take one 75 mg capsule orally twice a day for the next 5 days.  ANYONE WHO HAS FLU SYMPTOMS SHOULD: Stay home. The flu is highly contagious and going out or to work exposes others! Be sure to drink plenty of fluids. Water is fine as well as fruit juices, sodas and electrolyte beverages. You may want to stay away from caffeine or alcohol. If you are nauseated, try taking small sips of liquids. How do you know if you are getting enough fluid? Your urine should be a pale yellow or almost colorless. Get rest. Taking a steamy  shower or using a humidifier may help nasal congestion and ease sore throat pain. Using a saline nasal spray works much the same way. Cough drops, hard candies and sore throat lozenges may ease your cough. Line up a caregiver. Have someone check on you regularly.   GET HELP RIGHT AWAY IF: You cannot keep down liquids or your medications. You become short of breath Your fell like you are going to pass out or loose consciousness. Your symptoms persist after you have completed your treatment plan MAKE SURE YOU  Understand these instructions. Will watch your condition. Will get help right away if you are not doing well or get worse.  Your e-visit answers were reviewed by a board certified advanced clinical practitioner to complete your personal care plan.  Depending on the condition, your plan could have included both over the counter or prescription medications.  If there is a problem please reply  once you have received a response from your provider.  Your safety is important to us.  If you have drug allergies check your prescription carefully.    You can use MyChart to ask questions about today's visit, request a non-urgent call back, or ask for a work or school excuse for 24 hours related to this e-Visit. If it has been greater than 24 hours you will need to follow up with your provider, or enter a new e-Visit to address those concerns.  You will get an e-mail in the next   two days asking about your experience.  I hope that your e-visit has been valuable and will speed your recovery. Thank you for using e-visits.  Approximately 5 minutes was spent documenting and reviewing patient's chart.    

## 2021-07-19 ENCOUNTER — Other Ambulatory Visit: Payer: Self-pay | Admitting: Family Medicine

## 2021-07-27 ENCOUNTER — Telehealth: Payer: Self-pay | Admitting: Internal Medicine

## 2021-07-27 DIAGNOSIS — G4733 Obstructive sleep apnea (adult) (pediatric): Secondary | ICD-10-CM

## 2021-07-27 NOTE — Telephone Encounter (Signed)
Called pt and there was no answer-LMTCB °

## 2021-07-30 ENCOUNTER — Ambulatory Visit: Payer: Medicare HMO | Admitting: Family Medicine

## 2021-07-30 NOTE — Telephone Encounter (Signed)
Spoke to patient, who is requesting HST results from 07/25/2021.  Dr. Belia Heman, please advise. Thanks

## 2021-07-30 NOTE — Telephone Encounter (Signed)
Lm x2 for patient.  Letter mailed to address on file.  

## 2021-08-01 ENCOUNTER — Other Ambulatory Visit: Payer: Self-pay

## 2021-08-01 ENCOUNTER — Encounter: Payer: Self-pay | Admitting: Family Medicine

## 2021-08-01 ENCOUNTER — Ambulatory Visit (INDEPENDENT_AMBULATORY_CARE_PROVIDER_SITE_OTHER): Payer: Medicare HMO | Admitting: Family Medicine

## 2021-08-01 VITALS — BP 138/76

## 2021-08-01 DIAGNOSIS — R0789 Other chest pain: Secondary | ICD-10-CM | POA: Diagnosis not present

## 2021-08-01 DIAGNOSIS — K219 Gastro-esophageal reflux disease without esophagitis: Secondary | ICD-10-CM | POA: Diagnosis not present

## 2021-08-01 MED ORDER — EPINEPHRINE 0.3 MG/0.3ML IJ SOAJ: 0.3000 mg | Freq: Once | INTRAMUSCULAR | 3 refills | Status: AC

## 2021-08-01 NOTE — Progress Notes (Signed)
Virtual Visit via Telephone Note  I connected with Adriana Marks on 08/01/21 at  2:00 PM EST by telephone and verified that I am speaking with the correct person using two identifiers.  Location: Patient: home Provider: office    I discussed the limitations, risks, security and privacy concerns of performing an evaluation and management service by telephone and the availability of in person appointments. I also discussed with the patient that there may be a patient responsible charge related to this service. The patient expressed understanding and agreed to proceed.  Parties involved in encounter  Patient: Adriana Marks  Provider:  Roxy Manns MD   History of Present Illness: Pt presents to discuss acid reflux (med management) or sinus drainage  Has an area of tingling/turning in L breast -unsure if reflux or pnd related  ? Also if she strained a muscle  It gets worse when she bends down or turns head to the left  A little worse after eating  Not sore to the touch  No rash at all   Not severely painful  Sinus trouble for 3 weeks -? Of possible flu  A little sinus pressure-goes away easily  Not a lot of cough  All mucous is clear  Tried some robitussin -Monday and it seems to be helping  Nose itches and runs  Eyes burn and sting   Not worse with exercise  No sob or nausea or vomiting  Pulse ox is 98%  Occ a little wheeze   Heartburn was worse for a few weeks  She had to change her diet  (chocolate/spicy food)  Avoids carbonation most of the time  Caffeine is not a problem  No nsaids  (uses tylenol instead) Still takes omeprazole 20 mg bid   Stress level has not been higher than usual  Still works -that has its ups and downs   She has moderate sleep apnea-waiting on a machine (on back order)  Takes omeprazole 20 mg bid   Last visit to GI was may of 2022 Noted chronic GERD and esoph dilatation in the past  Had considered EGD to rule out Barretts but there was an  insurance problem   Last EKG 02/2021 NSR rate of 67 and no acute changes   Also needs refill epi pen    Patient Active Problem List   Diagnosis Date Noted   Chest discomfort 08/01/2021   Sleep apnea 01/29/2021   Chronic headaches 12/07/2020   Hyponatremia 04/12/2020   Screening for colon cancer 10/29/2019   Stress reaction 08/31/2019   Breast cancer screening by mammogram 12/08/2017   Estrogen deficiency 12/04/2016   Grief reaction 09/09/2014   Adjustment disorder with depressed mood 09/09/2014   Encounter for Medicare annual wellness exam 09/06/2013   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   Prediabetes 08/12/2012   Other screening mammogram 03/22/2011   Hyperlipidemia 01/01/2011   Hypertension 01/01/2011   GERD (gastroesophageal reflux disease) 01/01/2011   Routine general medical examination at a health care facility 01/01/2011   Vitamin D deficiency disease 01/01/2011   Osteopenia 01/01/2011   Anxiety 01/01/2011   Allergic rhinitis 01/01/2011   Rosacea 01/01/2011   Disorder of bone and cartilage 01/01/2011   Past Medical History:  Diagnosis Date   Allergy    Bronchitis    Colon polyps    Depression    GERD (gastroesophageal reflux disease)    Heart murmur    History of chicken pox    Hx: UTI (  urinary tract infection)    Hyperlipidemia    Hypertension    Migraines    PONV (postoperative nausea and vomiting)    Rosacea    Sleep apnea    uses cpap every night    Past Surgical History:  Procedure Laterality Date   COLONOSCOPY WITH PROPOFOL N/A 01/25/2021   Procedure: COLONOSCOPY WITH PROPOFOL;  Surgeon: Toney Reil, MD;  Location: [facility] ENDOSCOPY;  Service: Gastroenterology;  Laterality: N/A;   DNC     FOOT SURGERY     Social History   Tobacco Use   Smoking status: Former   Smokeless tobacco: Never  Vaping Use   Vaping Use: Never used  Substance Use Topics   Alcohol use: Yes    Alcohol/week: 0.0 standard drinks    Comment:  wine occasional   Drug use: No   Family History  Problem Relation Age of Onset   Heart disease Father    Hypertension Father    Diabetes Other    Heart disease Mother    Stroke Paternal Grandmother    Heart disease Paternal Grandfather    Hypertension Paternal Grandfather    Breast cancer Paternal Aunt    Breast cancer Sister    Cancer Sister    Bladder Cancer Neg Hx    Kidney cancer Neg Hx    Allergies  Allergen Reactions   Shellfish Allergy Anaphylaxis    Sob/ swelling  Sob/ swelling    Amlodipine     headache   Current Outpatient Medications on File Prior to Visit  Medication Sig Dispense Refill   aspirin 81 MG tablet Take 81 mg by mouth daily.     busPIRone (BUSPAR) 15 MG tablet TAKE 1 TABLET BY MOUTH TWICE A DAY 180 tablet 2   lisinopril (ZESTRIL) 20 MG tablet Take 1 tablet (20 mg total) by mouth daily. 90 tablet 3   loratadine (CLARITIN) 10 MG tablet Take 10 mg by mouth daily.     metroNIDAZOLE (METROGEL) 1 % gel Apply topically 2 (two) times daily. 180 g 3   omeprazole (PRILOSEC) 20 MG capsule TAKE 1 CAPSULE BY MOUTH TWICE A DAY 180 capsule 0   sertraline (ZOLOFT) 50 MG tablet TAKE 1 TABLET BY MOUTH EVERY DAY 90 tablet 0   simvastatin (ZOCOR) 20 MG tablet TAKE 1 TABLET (20 MG TOTAL) BY MOUTH DAILY AT 6 PM. 90 tablet 0   No current facility-administered medications on file prior to visit.   Review of Systems  Constitutional:  Negative for chills, fever and malaise/fatigue.  HENT:  Positive for congestion. Negative for ear pain, sinus pain and sore throat.   Eyes:  Negative for blurred vision, discharge and redness.  Respiratory:  Negative for cough, sputum production, shortness of breath, wheezing and stridor.   Cardiovascular:  Positive for chest pain. Negative for palpitations and leg swelling.  Gastrointestinal:  Positive for heartburn. Negative for abdominal pain, diarrhea, nausea and vomiting.  Musculoskeletal:  Negative for myalgias.  Skin:  Negative for  rash.  Neurological:  Negative for dizziness and headaches.  Psychiatric/Behavioral:         Stressors   Observations/Objective: Pt sounds well, in no distress Voice is baseline, not hoarse  No audible cough or sob or wheeze Occ clears her throat Nl cognition, good historian Mood is baseline to slightly anxious    Assessment and Plan: Problem List Items Addressed This Visit       Digestive   GERD (gastroesophageal reflux disease)    Unsure if  GERD is causing her chest discomfort  Generally omeprazole 20 mg bid works well  Enc her to work on diet  inst to get otc pepcid 10 to 20 mg to take daily in evening  F/u if no further improvement  Continue to avoid carbonation         Other   Chest discomfort - Primary    Pt describes a tingly feeling over L ant chest  This correlates with recent GERD and also pnd from allergies (but no cough) and some stress Cannot fully assess this by phone, enc her to schedule an in person visit when able  No tenderness on self palpation  No red flags for cardiac cause (and not exertional)  Discussed symptom control re: congestion and also gerd  ER precautions discusse        Follow Up Instructions: Try over the counter pepcid 10 or 20 mg at bedtime for acid reflux and avoid foods that flare your symptoms   An antihistamine like zyrtec may help post nasal drip/allergy symptoms   If symptoms worsen or become severe (chest pain) or if you develop shortness of breath please alert [place] and go to the ER   Please follow up in the office for further evaluation of chest discomfort when you can    I discussed the assessment and treatment plan with the patient. The patient was provided an opportunity to ask questions and all were answered. The patient agreed with the plan and demonstrated an understanding of the instructions.   The patient was advised to call back or seek an in-person evaluation if the symptoms worsen or if the condition fails to  improve as anticipated.  I provided 19 minutes of non-face-to-face time during this encounter.   Roxy Manns, MD

## 2021-08-01 NOTE — Telephone Encounter (Signed)
Patient is aware of results and voiced her understanding.  She would like to proceed with cpap.  Order placed. Recall placed. Nothing further needed at this time.

## 2021-08-01 NOTE — Assessment & Plan Note (Signed)
Unsure if GERD is causing her chest discomfort  Generally omeprazole 20 mg bid works well  Enc her to work on diet  inst to get otc pepcid 10 to 20 mg to take daily in evening  F/u if no further improvement  Continue to avoid carbonation

## 2021-08-01 NOTE — Assessment & Plan Note (Signed)
Pt describes a tingly feeling over L ant chest  This correlates with recent GERD and also pnd from allergies (but no cough) and some stress Cannot fully assess this by phone, enc her to schedule an in person visit when able  No tenderness on self palpation  No red flags for cardiac cause (and not exertional)  Discussed symptom control re: congestion and also gerd  ER precautions discusse

## 2021-08-01 NOTE — Patient Instructions (Addendum)
Try over the counter pepcid 10 or 20 mg at bedtime for acid reflux and avoid foods that flare your symptoms   An antihistamine like zyrtec may help post nasal drip/allergy symptoms   If symptoms worsen or become severe (chest pain) or if you develop shortness of breath please alert [place] and go to the ER   Please follow up in the office for further evaluation of chest discomfort when you can

## 2021-08-14 DIAGNOSIS — G4733 Obstructive sleep apnea (adult) (pediatric): Secondary | ICD-10-CM | POA: Diagnosis not present

## 2021-08-23 ENCOUNTER — Other Ambulatory Visit: Payer: Self-pay | Admitting: Family Medicine

## 2021-08-31 ENCOUNTER — Telehealth (INDEPENDENT_AMBULATORY_CARE_PROVIDER_SITE_OTHER): Payer: Medicare HMO | Admitting: Family

## 2021-08-31 ENCOUNTER — Encounter: Payer: Self-pay | Admitting: Family

## 2021-08-31 VITALS — BP 158/72 | Temp 99.2°F | Ht 62.0 in | Wt 160.0 lb

## 2021-08-31 DIAGNOSIS — J309 Allergic rhinitis, unspecified: Secondary | ICD-10-CM | POA: Diagnosis not present

## 2021-08-31 DIAGNOSIS — U071 COVID-19: Secondary | ICD-10-CM | POA: Diagnosis not present

## 2021-08-31 MED ORDER — MOLNUPIRAVIR EUA 200MG CAPSULE: 4.0000 | ORAL_CAPSULE | Freq: Two times a day (BID) | ORAL | 0 refills | Status: AC

## 2021-08-31 NOTE — Assessment & Plan Note (Signed)
Antiviral sent to pharmacy, pt high risk >74 y/o not utd on covid vaccines,  Dx htn and prediabetes.   Advised of CDC guidelines for self isolation/ ending isolation.  Advised of safe practice guidelines. Symptom Tier reviewed.  Encouraged to monitor for any worsening symptoms; watch for increased shortness of breath, weakness, and signs of dehydration. Advised when to seek emergency care.  Instructed to rest and hydrate well.  Advised to leave the house during recommended isolation period, only if it is necessary to seek medical care

## 2021-08-31 NOTE — Assessment & Plan Note (Signed)
Pt to continue with claritin as well as start Flonase once daily as this may help with histamine response.

## 2021-08-31 NOTE — Progress Notes (Signed)
MyChart Video Visit    Virtual Visit via Video Note   This visit type was conducted due to national recommendations for restrictions regarding the COVID-19 Pandemic (e.g. social distancing) in an effort to limit this patient's exposure and mitigate transmission in our community. This patient is at least at moderate risk for complications without adequate follow up. This format is felt to be most appropriate for this patient at this time. Physical exam was limited by quality of the video and audio technology used for the visit. CMA was able to get the patient set up on a video visit.  Patient location: Home. Patient and provider in visit Provider location: Office  I discussed the limitations of evaluation and management by telemedicine and the availability of in person appointments. The patient expressed understanding and agreed to proceed.  Visit Date: 08/31/2021  Today's healthcare provider: Mort Sawyers, FNP     Subjective:    Patient ID: Adriana Marks, female    DOB: 1945/10/02, 76 y.o.   MRN: [id number]  Chief Complaint  Patient presents with   Covid Positive   Generalized Body Aches   Cough   Nasal Congestion   Fever    he   Headache   nasal drainage    Cough Associated symptoms include a fever, headaches, postnasal drip, rhinorrhea and a sore throat (itchy). Pertinent negatives include no chest pain, chills, ear pain (bil ears itching), shortness of breath or wheezing.  Fever  Associated symptoms include congestion, coughing, headaches and a sore throat (itchy). Pertinent negatives include no chest pain, ear pain (bil ears itching) or wheezing.  Headache  Associated symptoms include coughing, a fever, rhinorrhea, sinus pressure (burns and stings when bending down) and a sore throat (itchy). Pertinent negatives include no ear pain (bil ears itching).   Pt here with covid positive results , tested positive today 08/31/20.  Sx started yesterday.   She has f/o fever  (up to 99.2 F), body aches, nasal congestion, headache, nasal drainage, and cough that is dry and non productive. Taking tessalon perrles which helps some as well. Denies sob. She does take a claritin daily already.  She is a widow and lives at home alone. 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   Covid vaccinated x 3, did not get bivalent booster as of yet.   Past Medical History:  Diagnosis Date   Allergy    Bronchitis    Colon polyps    Depression    GERD (gastroesophageal reflux disease)    Heart murmur    History of chicken pox    Hx: UTI (urinary tract infection)    Hyperlipidemia    Hypertension    Migraines    PONV (postoperative nausea and vomiting)    Rosacea    Sleep apnea    uses cpap every night     Past Surgical History:  Procedure Laterality Date   COLONOSCOPY WITH PROPOFOL N/A 01/25/2021   Procedure: COLONOSCOPY WITH PROPOFOL;  Surgeon: Toney Reil, MD;  Location: [facility] ENDOSCOPY;  Service: Gastroenterology;  Laterality: N/A;   DNC     FOOT SURGERY      Family History  Problem Relation Age of Onset   Heart disease Father    Hypertension Father    Diabetes Other    Heart disease Mother    Stroke Paternal Grandmother    Heart disease Paternal Grandfather    Hypertension Paternal Grandfather    Breast cancer Paternal Aunt  Breast cancer Sister    Cancer Sister    Bladder Cancer Neg Hx    Kidney cancer Neg Hx     Social History   Socioeconomic History   Marital status: Widowed    Spouse name: Not on file   Number of children: Not on file   Years of education: Not on file   Highest education level: Not on file  Occupational History   Occupation: Fish farm manager: [employer]  Tobacco Use   Smoking status: Former   Smokeless tobacco: Never  Vaping Use   Vaping Use: Never used  Substance and Sexual Activity   Alcohol use: Yes    Alcohol/week: 0.0 standard drinks    Comment: wine occasional   Drug use: No   Sexual activity:  Never  Other Topics Concern   Not on file  Social History Narrative   Exercises 3 or more times per week walks 30 minutes at a time       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     busPIRone (BUSPAR) 15 MG tablet TAKE 1 TABLET BY MOUTH TWICE A DAY 180 tablet 2   lisinopril (ZESTRIL) 20 MG tablet Take 1 tablet (20 mg total) by mouth daily. 90 tablet 3   loratadine (CLARITIN) 10 MG tablet Take 10 mg by mouth daily.     metroNIDAZOLE (METROGEL) 1 % gel Apply topically 2 (two) times daily. 180 g 3   omeprazole (PRILOSEC) 20 MG capsule TAKE 1 CAPSULE BY MOUTH TWICE A DAY 180 capsule 2   sertraline (ZOLOFT) 50 MG tablet TAKE 1 TABLET BY MOUTH EVERY DAY 90 tablet 0   simvastatin (ZOCOR) 20 MG tablet TAKE 1 TABLET (20 MG TOTAL) BY MOUTH DAILY AT 6 PM. 90 tablet 0   No facility-administered medications prior to visit.    Allergies  Allergen Reactions   Shellfish Allergy Anaphylaxis    Sob/ swelling   Amlodipine     headache    Review of Systems  Constitutional:  Positive for fever. Negative for chills.  HENT:  Positive for congestion, postnasal drip, rhinorrhea, sinus pressure (burns and stings when bending down) and sore throat (itchy). Negative for ear pain (bil ears itching).   Respiratory:  Positive for cough. Negative for shortness of breath and wheezing.   Cardiovascular:  Negative for chest pain and palpitations.  Neurological:   Positive for headaches.      Objective:    Physical Exam Constitutional:      General: She is not in acute distress.    Appearance: She is well-developed and normal weight. She is not ill-appearing, toxic-appearing or diaphoretic.  HENT:     Head: Normocephalic.  Neurological:     Mental Status: She is alert.  Psychiatric:  Mood and Affect: Mood normal.        Speech: Speech normal.        Behavior: Behavior normal.    BP (!) 158/72    Temp 99.2 F (37.3 C) (Temporal)    Ht 5\' 2"  (1.575 m)    Wt 160 lb (72.6 kg)    LMP 08/26/1997    BMI 29.26 kg/m  Wt Readings from Last 3 Encounters:  08/31/21 160 lb (72.6 kg)  05/16/21 161 lb (73 kg)  04/16/21 161 lb 11.2 oz (73.3 kg)       Assessment & Plan:   Problem List Items Addressed This Visit       Respiratory   Allergic rhinitis    Pt to continue with claritin as well as start Flonase once daily as this may help with histamine response.        Other   COVID-19 - Primary    Antiviral sent to pharmacy, pt high risk 93>60 y/o not utd on covid vaccines,  Dx htn and prediabetes.   Advised of CDC guidelines for self isolation/ ending isolation.  Advised of safe practice guidelines. Symptom Tier reviewed.  Encouraged to monitor for any worsening symptoms; watch for increased shortness of breath, weakness, and signs of dehydration. Advised when to seek emergency care.  Instructed to rest and hydrate well.  Advised to leave the house during recommended isolation period, only if it is necessary to seek medical care       Relevant Medications   molnupiravir EUA (LAGEVRIO) 200 mg CAPS capsule    I am having Adriana Goreathy T. Hovsepian start on molnupiravir EUA. I am also having her maintain her aspirin, loratadine, metroNIDAZOLE, lisinopril, simvastatin, busPIRone, sertraline, and omeprazole.  Meds ordered this encounter  Medications   molnupiravir EUA (LAGEVRIO) 200 mg CAPS capsule    Sig: Take 4 capsules (800 mg total) by mouth 2  (two) times daily for 5 days.    Dispense:  40 capsule    Refill:  0    High risk 74>60 y/o, prediabetic, htn    Order Specific Question:   Supervising Provider    Answer:   BEDSOLE, AMY E [[id number]]    I discussed the assessment and treatment plan with the patient. The patient was provided an opportunity to ask questions and all were answered. The patient agreed with the plan and demonstrated an understanding of the instructions.   The patient was advised to call back or seek an in-person evaluation if the symptoms worsen or if the condition fails to improve as anticipated.  I provided 15 minutes of face-to-face time during this encounter.   Mort Sawyersabitha Dayona Shaheen, FNP [facility] [phone] (phone) [phone] (fax)  [facility]

## 2021-09-14 ENCOUNTER — Other Ambulatory Visit: Payer: Self-pay | Admitting: Family Medicine

## 2021-09-14 DIAGNOSIS — G4733 Obstructive sleep apnea (adult) (pediatric): Secondary | ICD-10-CM | POA: Diagnosis not present

## 2021-09-28 ENCOUNTER — Telehealth: Payer: Self-pay

## 2021-09-28 NOTE — Telephone Encounter (Signed)
Called and spoke to patient in regards to upcoming appointment on 2/10 patient is aware and patient will bring sd card to upcoming appt.

## 2021-10-03 DIAGNOSIS — J329 Chronic sinusitis, unspecified: Secondary | ICD-10-CM | POA: Diagnosis not present

## 2021-10-03 DIAGNOSIS — R059 Cough, unspecified: Secondary | ICD-10-CM | POA: Diagnosis not present

## 2021-10-04 ENCOUNTER — Telehealth: Payer: Self-pay

## 2021-10-04 NOTE — Telephone Encounter (Signed)
[facility] Night - Client TELEPHONE ADVICE RECORD Access[facility] Patient Name: Adriana Marks Gender: Unknown DOB: 12-16-1945 Age: 76 Y 9 M 20 D Return Phone Number: [phone] (Primary) Address: City/ State/ Zip: [place]  [address] Client [facility] Night - Client Client Site  [facility] - Night Provider [name], Idamae Schuller - MD Contact Type Call Who Is Calling Patient / Member / Family / Caregiver Call Type Triage / Clinical Relationship To Patient Self Return Phone Number [phone] (Primary) Chief Complaint WHEEZING Reason for Call Symptomatic / Request for Health Information Initial Comment Caller has been seen virtually and phone appts. Mentions she had Covid JAN 6 and saw Tabatha (not listed). Mentions sxs since OCT. Current sxs continue - abd issues- diarrhea (has lessened), fatigue, headaches, body aches, cough. Cough is worse now, mentions taking pearl Rx. Mentions wheezing, yellow, green, brown discharge. Translation No Nurse Assessment Nurse: Stefano Gaul, RN, Dwana Curd Date/Time (Eastern Time): 10/03/2021 8:39:15 AM Confirm and document reason for call. If symptomatic, describe symptoms. ---Caller states she had COVID on Jan 6. Has cough with sinus pressure. Has had yellow nasal congestion. no fever. Woke up with cough. took [employer]. Has body aches. Had a headache. Had some diarrhea which is getting better. Has heard wheezing. Does the patient have any new or worsening symptoms? ---Yes Will a triage be completed? ---Yes Related visit to physician within the last 2 weeks? ---Yes Does the PT have any chronic conditions? (i.e. diabetes, asthma, this includes High risk factors for pregnancy, etc.) ---No Is this a behavioral health or substance abuse call? ---No Guidelines Guideline Title Affirmed Question Affirmed Notes Nurse Date/Time (Eastern Time) COVID-19 - Persisting  Symptoms Follow-up Call [1] MILD difficulty breathing (e.g., minimal/no SOB at rest, SOB with walking, pulse <100) AND [2] new-onset Stefano Gaul, RN, Dwana Curd 10/03/2021 8:44:25 AM PLEASE NOTE: All timestamps contained within this report are represented as [place] Standard Time. CONFIDENTIALTY NOTICE: This fax transmission is intended only for the addressee. It contains information that is legally privileged, confidential or otherwise protected from use or disclosure. If you are not the intended recipient, you are strictly prohibited from reviewing, disclosing, copying using or disseminating any of this information or taking any action in reliance on or regarding this information. If you have received this fax in error, please notify us immediately by telephone so that we can arrange for its return to [place]. Phone: [phone], Toll-Free: [phone], Fax: [phone] Page: 2 of 2 Call Id: [id number] Disp. Time Lamount Cohen Time) Disposition Final User 10/03/2021 8:36:45 AM Send to Urgent Ellin Goodie 10/03/2021 8:50:13 AM See HCP within 4 Hours (or PCP triage) Yes Stefano Gaul, RN, Clerance Lav Disagree/Comply Comply Caller Understands Yes PreDisposition Call Doctor Care Advice Given Per Guideline SEE HCP (OR PCP TRIAGE) WITHIN 4 HOURS: * IF OFFICE WILL BE OPEN: You need to be seen within the next 3 or 4 hours. Call your doctor (or NP/PA) now or as soon as the office opens. CARE ADVICE given per COVID-19 - Persisting Symptoms Follow-Up Call (Adult) guideline. Comments User: Art Buff, RN Date/Time Lamount Cohen Time): 10/03/2021 8:49:40 AM pt does not want me to call to see about getting her an appt if she has to go to [place]. Also, she is upset I can not access her medical records. states she will go to walk in clinic User: Art Buff, RN Date/Time Lamount Cohen Time): 10/03/2021 8:51:14 AM coughing up brownish sputum Referrals GO TO FACILITY UNDECIDE

## 2021-10-04 NOTE — Telephone Encounter (Signed)
Aware, will watch for correspondence  

## 2021-10-04 NOTE — Telephone Encounter (Signed)
I spoke with pt; pt went to [facility] and taking abx,prednisone and cough med. Sending note to DR [employer] and shapale CMA.

## 2021-10-04 NOTE — Telephone Encounter (Deleted)
[facility] Day - Client °TELEPHONE ADVICE RECORD °AccessNurse® °Patient °Name: Adriana Marks °Gender: Female °DOB: 07/07/1963 °Age: 76 Y 2 M 28 D °Return °Phone °Number: °[phone] °(Primary) °Address: °City/ °State/ °Zip: °[place] ° [address] °Client [facility] Day - Client °Client Site [facility] - Day °Provider Letvak, Richard- MD °Contact Type Call °Who Is Calling Patient / Member / Family / Caregiver °Call Type Triage / Clinical °Relationship To Patient Self °Return Phone Number [phone] (Primary) °Chief Complaint Earache °Reason for Call Symptomatic / Request for Health Information °Initial Comment Caller states she is having ear pain, °Additional Comment xfer from office for triage due to no appts today °Translation No °Nurse Assessment °Nurse: Lovelace, RN, Amy Date/Time (Eastern Time): 10/04/2021 10:24:18 AM °Confirm and document reason for call. If °symptomatic, describe symptoms. °---Caller states she is having right ear pain. It started °last night. She rates it as a 10/10 when it shoots in her °ear. No fever or other symptoms. °Does the patient have any new or worsening °symptoms? ---Yes °Will a triage be completed? ---Yes °Related visit to physician within the last 2 weeks? ---No °Does the PT have any chronic conditions? (i.e. °diabetes, asthma, this includes High risk factors for °pregnancy, etc.) °---Yes °List chronic conditions. ---back pain, depression °Is this a behavioral health or substance abuse call? ---No °Guidelines °Guideline Title Affirmed Question Affirmed Notes Nurse Date/Time (Eastern °Time) °Earache [1] SEVERE °pain AND [2] °not improved 2 °hours after taking °analgesic medication °(e.g., ibuprofen or °acetaminophen) °Lovelace, RN, Amy 10/04/2021 10:26:28 °AM °Disp. Time (Eastern °Time) Disposition Final User °PLEASE NOTE: All timestamps contained within this report are represented as [place] Standard  Time. °CONFIDENTIALTY NOTICE: This fax transmission is intended only for the addressee. It contains information that is legally privileged, confidential or °otherwise protected from use or disclosure. If you are not the intended recipient, you are strictly prohibited from reviewing, disclosing, copying using °or disseminating any of this information or taking any action in reliance on or regarding this information. If you have received this fax in error, please °notify us immediately by telephone so that we can arrange for its return to us. Phone: [phone], Toll-Free: [phone], Fax: [phone] °Page: 2 of 2 °Call Id: [id number] °10/04/2021 10:28:14 AM See HCP within 4 Hours (or °PCP triage) °Yes Lovelace, RN, Amy °Caller Disagree/Comply Disagree °Caller Understands Yes °PreDisposition InappropriateToAsk °Care Advice Given Per Guideline °SEE HCP (OR PCP TRIAGE) WITHIN 4 HOURS: * IF OFFICE WILL BE OPEN: You need to be seen within the next 3 or °4 hours. Call your doctor (or NP/PA) now or as soon as the office opens. PAIN MEDICINES: * For pain relief, you can take °either acetaminophen, ibuprofen, or naproxen. CALL BACK IF: * You become worse CARE ADVICE given per Earache (Adult) °guideline. °Referrals °GO TO FACILITY REFUSE °

## 2021-10-05 ENCOUNTER — Other Ambulatory Visit: Payer: Self-pay

## 2021-10-05 ENCOUNTER — Ambulatory Visit: Payer: Medicare HMO | Admitting: Adult Health

## 2021-10-05 ENCOUNTER — Encounter: Payer: Self-pay | Admitting: Adult Health

## 2021-10-05 VITALS — BP 140/82 | HR 83 | Temp 97.8°F | Ht 62.0 in | Wt 160.8 lb

## 2021-10-05 DIAGNOSIS — G4733 Obstructive sleep apnea (adult) (pediatric): Secondary | ICD-10-CM

## 2021-10-05 NOTE — Addendum Note (Signed)
Addended by: Lajoyce Lauber A on: 10/05/2021 02:18 PM   Modules accepted: Orders

## 2021-10-05 NOTE — Assessment & Plan Note (Signed)
Excellent control and compliance on CPAP.  We will adjust CPAP pressure to 5 to 15 cm H2O to see if can decrease number of events even further.  Plan  Patient Instructions  Keep up the great work Continue on CPAP at bedtime. Adjust CPAP pressure 5 to 15 cm H2O. Remain active Work on healthy weight loss Do not drive if sleepy Follow-up in 6 months with Dr Belia Heman or Bernita Beckstrom NP and  As needed

## 2021-10-05 NOTE — Progress Notes (Signed)
@Patient  ID: Adriana Marks, female    DOB: [birth year], 76 y.o.   MRN: [id number]  Chief Complaint  Patient presents with   Follow-up    Referring provider: Abner Greenspan, MD  HPI: 76 year old female seen for sleep consult May 16, 2021 for excessive daytime sleepiness and snoring.  Patient was set up for a home sleep study that showed moderate obstructive sleep apnea Previously diagnosed with OSA was on CPAP but machine broke and was off CPAP for 2 years,.   TEST/EVENTS :  Home sleep study June 24, 2021 showed moderate sleep apnea with AHI at 19.4/hour and SPO2 low at 82%  10/05/2021 Follow up ; OSA  Patient returns for a follow-up visit.  Patient was seen in September for sleep consult.  She had daytime sleepiness and snoring.  Patient was set up for a home sleep study that was completed on June 24, 2021 that showed moderate sleep apnea with AHI at 19.4/hour.  And SPO2 low at 82%.  Patient was recommended to begin CPAP therapy.  Patient says that she is try to get used to her CPAP.  She is trying to wear it each night.  She does feel that she has benefited in it with decreased daytime sleepiness and more improved sleep regimen.  CPAP download shows excellent compliance with daily average usage at 8 hours.  Patient is on AutoSet 5 to 12 cm H2O.  AHI is 5.2/hour.  Minimum leaks.  Daily average pressure at 11.8 cm H2O. Using full face mask   Says she is being treated for sinus infection after Covid 19 infection last month. Currently taking Augmentin and steroid taper.   Allergies  Allergen Reactions   Shellfish Allergy Anaphylaxis    Sob/ swelling   Amlodipine     headache    Immunization History  Administered Date(s) Administered   Influenza, High Dose Seasonal PF 05/02/2017, 05/06/2018, 06/07/2020, 06/17/2021   Influenza,inj,Quad PF,6+ Mos 06/04/2019   Influenza-Unspecified 05/18/2013, 05/11/2016   PFIZER(Purple Top)SARS-COV-2 Vaccination 10/18/2019, 11/09/2019,  06/18/2020   PPD Test 02/02/2013, 04/24/2016, 12/26/2020   Pneumococcal Conjugate-13 09/09/2014   Pneumococcal Polysaccharide-23 11/14/2017   Pneumococcal-Unspecified 08/27/2012   Tdap 01/01/2011   Zoster, Live 08/13/2012    Past Medical History:  Diagnosis Date   Allergy    Bronchitis    Colon polyps    Depression    GERD (gastroesophageal reflux disease)    Heart murmur    History of chicken pox    Hx: UTI (urinary tract infection)    Hyperlipidemia    Hypertension    Migraines    PONV (postoperative nausea and vomiting)    Rosacea    Sleep apnea    uses cpap every night     Tobacco History: Social History   Tobacco Use  Smoking Status Former   Packs/day: 0.50   Years: 4.00   Pack years: 2.00   Types: Cigarettes   Quit date: 1980   Years since quitting: 43.1  Smokeless Tobacco Never   Counseling given: Not Answered   Outpatient Medications Prior to Visit  Medication Sig Dispense Refill   amoxicillin-clavulanate (AUGMENTIN) 875-125 MG tablet Take 1 tablet by mouth 2 (two) times daily.     aspirin 81 MG tablet Take 81 mg by mouth daily.     busPIRone (BUSPAR) 15 MG tablet TAKE 1 TABLET BY MOUTH TWICE A DAY 180 tablet 2   lisinopril (ZESTRIL) 20 MG tablet Take 1 tablet (20 mg total) by mouth daily. Dwight  tablet 3   loratadine (CLARITIN) 10 MG tablet Take 10 mg by mouth daily.     metroNIDAZOLE (METROGEL) 1 % gel Apply topically 2 (two) times daily. 180 g 3   omeprazole (PRILOSEC) 20 MG capsule TAKE 1 CAPSULE BY MOUTH TWICE A DAY 180 capsule 2   predniSONE (DELTASONE) 20 MG tablet Take 20 mg by mouth daily.     promethazine-dextromethorphan (PROMETHAZINE-DM) 6.25-15 MG/5ML syrup Take by mouth.     sertraline (ZOLOFT) 50 MG tablet TAKE 1 TABLET BY MOUTH EVERY DAY 90 tablet 0   simvastatin (ZOCOR) 20 MG tablet TAKE 1 TABLET BY MOUTH DAILY AT 6 PM. 90 tablet 1   No facility-administered medications prior to visit.     Review of Systems:   Constitutional:   No   weight loss, night sweats,  Fevers, chills,  +fatigue, or  lassitude.  HEENT:   No headaches,  Difficulty swallowing,  Tooth/dental problems, or  Sore throat,                No sneezing, itching, ear ache,  +nasal congestion, post nasal drip,   CV:  No chest pain,  Orthopnea, PND, swelling in lower extremities, anasarca, dizziness, palpitations, syncope.   GI  No heartburn, indigestion, abdominal pain, nausea, vomiting, diarrhea, change in bowel habits, loss of appetite, bloody stools.   Resp: No shortness of breath with exertion or at rest.  No excess mucus, no productive cough,  No non-productive cough,  No coughing up of blood.  No change in color of mucus.  No wheezing.  No chest wall deformity  Skin: no rash or lesions.  GU: no dysuria, change in color of urine, no urgency or frequency.  No flank pain, no hematuria   MS:  No joint pain or swelling.  No decreased range of motion.  No back pain.    Physical Exam  BP 140/82 (BP Location: Left Arm, Cuff Size: Normal)    Pulse 83    Temp 97.8 F (36.6 C) (Temporal)    Ht 5\' 2"  (1.575 m)    Wt 160 lb 12.8 oz (72.9 kg)    LMP 08/26/1997    SpO2 96%    BMI 29.41 kg/m   GEN: A/Ox3; pleasant , NAD, well nourished    HEENT:  [place]/AT, , NOSE-clear, THROAT-clear, no lesions, no postnasal drip or exudate noted.   NECK:  Supple w/ fair ROM; no JVD; normal carotid impulses w/o bruits; no thyromegaly or nodules palpated; no lymphadenopathy.    RESP  Clear  P & A; w/o, wheezes/ rales/ or rhonchi. no accessory muscle use, no dullness to percussion  CARD:  RRR, no m/r/g, no peripheral edema, pulses intact, no cyanosis or clubbing.  GI:   Soft & nt; nml bowel sounds; no organomegaly or masses detected.   Musco: Warm bil, no deformities or joint swelling noted.   Neuro: alert, no focal deficits noted.    Skin: Warm, no lesions or rashes    Lab Results:  CBC   BMET   BNP No results found for: BNP  ProBNP No results found for:  PROBNP  Imaging: No results found.    No flowsheet data found.  No results found for: NITRICOXIDE      Assessment & Plan:   OSA (obstructive sleep apnea) Excellent control and compliance on CPAP.  We will adjust CPAP pressure to 5 to 15 cm H2O to see if can decrease number of events even further.  Plan  Patient  Instructions  Keep up the great work Continue on CPAP at bedtime. Adjust CPAP pressure 5 to 15 cm H2O. Remain active Work on healthy weight loss Do not drive if sleepy Follow-up in 6 months with Dr Mortimer Fries or Royal Piedra NP and  As needed          Rexene Edison, NP 10/05/2021

## 2021-10-05 NOTE — Patient Instructions (Signed)
Keep up the great work Continue on CPAP at bedtime. Adjust CPAP pressure 5 to 15 cm H2O. Remain active Work on healthy weight loss Do not drive if sleepy Follow-up in 6 months with Dr Belia Heman or Montie Gelardi NP and  As needed

## 2021-10-15 DIAGNOSIS — G4733 Obstructive sleep apnea (adult) (pediatric): Secondary | ICD-10-CM | POA: Diagnosis not present

## 2021-10-18 ENCOUNTER — Other Ambulatory Visit: Payer: Self-pay | Admitting: Family Medicine

## 2021-11-12 DIAGNOSIS — G4733 Obstructive sleep apnea (adult) (pediatric): Secondary | ICD-10-CM | POA: Diagnosis not present

## 2021-12-13 DIAGNOSIS — G4733 Obstructive sleep apnea (adult) (pediatric): Secondary | ICD-10-CM | POA: Diagnosis not present

## 2021-12-14 ENCOUNTER — Telehealth: Payer: Self-pay

## 2021-12-14 NOTE — Telephone Encounter (Signed)
I spoke with pt; pt said no CP now; pt had about few seconds of dull lt sided CP today at 10 AM. No radiation of pain. Pt said has had pain on and off since Oct. Pt is baby sitting today and cannot schedule appt but I spoke with Dr Glori Bickers and pt changed 12/18/21 appt with Dr Glori Bickers to 12/17/21 at 12 noon. UC & ED precautions given and pt voiced understanding. Sending note to Dr Glori Bickers and Hayden CMA. ?

## 2021-12-14 NOTE — Telephone Encounter (Signed)
Agree with ER precautions  °Will see her then °

## 2021-12-14 NOTE — Telephone Encounter (Signed)
[facility] Day - Client ?TELEPHONE ADVICE RECORD ?AccessNurse? ?Patient ?Name: ?Adriana Marks ?Gender: Female ?DOB: March 04, 1946 ?Age: 76 Y 2 D ?Return ?Phone ?Number: ?[phone] ?(Primary) ?Address: ?City/ ?State/ ?Zip: [place] ?[address] ?Client [facility] Day - Client ?Client Site [facility] - Day ?Provider [name], Idamae Schuller - MD ?Contact Type Call ?Who Is Calling Patient / Member / Family / Caregiver ?Call Type Triage / Clinical ?Relationship To Patient Self ?Return Phone Number [phone] (Primary) ?Chief Complaint CHEST PAIN - pain, pressure, heaviness or ?tightness ?Reason for Call Symptomatic / Request for Health Information ?Initial Comment Caller states that a patient is having pain in her ?chest. ?Translation No ?Nurse Assessment ?Nurse: Hermelinda Dellen, RN, Brandi Date/Time (Eastern Time): 12/14/2021 1:55:11 PM ?Confirm and document reason for call. If ?symptomatic, describe symptoms. ?---Caller states that a patient is having pain in her ?chest. ?Does the patient have any new or worsening ?symptoms? ---Yes ?Will a triage be completed? ---Yes ?Related visit to physician within the last 2 weeks? ---No ?Does the PT have any chronic conditions? (i.e. ?diabetes, asthma, this includes High risk factors for ?pregnancy, etc.) ?---Yes ?List chronic conditions. ---HTN; rx daily ?Is this a behavioral health or substance abuse call? ---Yes ?Are you having any thoughts or feelings of harming ?or killing yourself or someone else? ---No ?Are you currently experiencing any physical ?discomfort that you think may be related to the use ?of alcohol or other drugs? (use substance abuse or ?alcohol abuse guidelines. These include withdrawal ?symptoms) ?---No ?Do you worry that you may be hearing or seeing ?things that others do not? ---No ?Do you take medications for your condition(s)? ---Yes ?List medications here. ---Buspar ?PLEASE NOTE: All timestamps contained  within this report are represented as [place] Standard Time. ?CONFIDENTIALTY NOTICE: This fax transmission is intended only for the addressee. It contains information that is legally privileged, confidential or ?otherwise protected from use or disclosure. If you are not the intended recipient, you are strictly prohibited from reviewing, disclosing, copying using ?or disseminating any of this information or taking any action in reliance on or regarding this information. If you have received this fax in error, please ?notify us immediately by telephone so that we can arrange for its return to [place]. Phone: [phone], Toll-Free: [phone], Fax: [phone] ?Page: 2 of 2 ?Call Id: [id number] ?Guidelines ?Guideline Title Affirmed Question Affirmed Notes Nurse Date/Time (Eastern ?Time) ?Chest Pain [1] Chest pain lasts ?< 5 minutes AND ?[2] NO chest pain or ?cardiac symptoms ?(e.g., breathing ?difficulty, sweating) ?now (Exception: ?chest pains that last ?only a few seconds) ?Hermelinda Dellen, RN, ?Brandi ?12/14/2021 1:56:18 ?PM ?Disp. Time (Eastern ?Time) Disposition Final User ?12/14/2021 1:52:57 PM Send to Urgent [place] Agent, Amy ?12/14/2021 2:06:19 PM See PCP within 24 Hours Yes Hermelinda Dellen, RN, Brandi ?Caller Disagree/Comply Comply ?Caller Understands Yes ?PreDisposition 911 ?Care Advice Given Per Guideline ?CALL BACK IF: * You become worse SEE PCP WITHIN 24 HOURS: * IF OFFICE WILL BE OPEN: You need to be examined ?within the next 24 hours. Call your doctor (or NP/PA) when the office opens and make an appointment. ?Comments ?User: Marinda Elk, RN Date/Time Lamount Cohen Time): 12/14/2021 1:57:07 PM ?HX: anxiety and acid refulx COLONoscopy completed last year. ?User: Marinda Elk, RN Date/Time Lamount Cohen Time): 12/14/2021 1:57:53 PM ?anxiety med: x2 ?User: Marinda Elk, RN Date/Time Lamount Cohen Time): 12/14/2021 2:01:41 PM ?Chest pain started in OCT; intermittently. Then She had COVID and seemed to be okay for some time.  Symptoms ?have returned  around the first of April 23 ?Referrals ?REFERRED TO PCP OFFIC ?

## 2021-12-17 ENCOUNTER — Ambulatory Visit (INDEPENDENT_AMBULATORY_CARE_PROVIDER_SITE_OTHER): Payer: Medicare HMO | Admitting: Family Medicine

## 2021-12-17 ENCOUNTER — Encounter: Payer: Self-pay | Admitting: Family Medicine

## 2021-12-17 VITALS — BP 158/88 | HR 81 | Temp 98.0°F | Resp 16 | Ht 62.0 in | Wt 163.2 lb

## 2021-12-17 DIAGNOSIS — R69 Illness, unspecified: Secondary | ICD-10-CM | POA: Diagnosis not present

## 2021-12-17 DIAGNOSIS — G4733 Obstructive sleep apnea (adult) (pediatric): Secondary | ICD-10-CM | POA: Diagnosis not present

## 2021-12-17 DIAGNOSIS — K219 Gastro-esophageal reflux disease without esophagitis: Secondary | ICD-10-CM

## 2021-12-17 DIAGNOSIS — F4321 Adjustment disorder with depressed mood: Secondary | ICD-10-CM

## 2021-12-17 DIAGNOSIS — I10 Essential (primary) hypertension: Secondary | ICD-10-CM

## 2021-12-17 DIAGNOSIS — R0789 Other chest pain: Secondary | ICD-10-CM | POA: Diagnosis not present

## 2021-12-17 MED ORDER — SERTRALINE HCL 50 MG PO TABS: ORAL_TABLET | ORAL | 3 refills | Status: DC

## 2021-12-17 MED ORDER — FAMOTIDINE 20 MG PO TABS: 20.0000 mg | ORAL_TABLET | Freq: Every day | ORAL | 3 refills | Status: DC

## 2021-12-17 NOTE — Assessment & Plan Note (Signed)
Worse in the setting of grief and also with worsening GERD and chest symptoms  ?May be falsely elevating bp ? ?Plan to continue buspar 15 mg bid ?Will inc sertraline to 75 mg daily and then 100 if needed  ?Then plan f/u  ?Self care and exercise discussed  ? ?

## 2021-12-17 NOTE — Assessment & Plan Note (Signed)
This correlates to GERD symptoms and pt is certain this is the cause  ?Also happened when she turned her neck (? Msk)  ?Not exertional  ?EKG is nl/stable  ? ?Will tx GERD and plan GI f/u  ?Also inc med for anxiety  ?If not improved would consider re eval/ poss CV ?

## 2021-12-17 NOTE — Patient Instructions (Addendum)
Omeprazole twice daily -continue this  ?Add pepcid 20 mg at bedtime  ? ?I will put referral in for [facility]) ?If you don't get a call in a week please let us know  ? ? ?Go up on zoloft to 1 1/2 pills (75 mg once daily)  for 2 weeks then if you want to go up to 100 mg (2 pills once daily)  ? ? ?If any problems or side effects let us know  ?Exercise is important for mental health ?Good self care is important also  ? ?If chest pain worsens/stays -go to the ER ? ?If you want to see a counselor let us know  ? ? ?

## 2021-12-17 NOTE — Progress Notes (Signed)
? ?Subjective:  ? ? Patient ID: Adriana Marks, female    DOB: May 14, 1946, 76 y.o.   MRN: [id number] ? ?HPI ?Pt presents for chest pain for 3 days  ? ?Wt Readings from Last 3 Encounters:  ?12/17/21 163 lb 4 oz (74 kg)  ?10/05/21 160 lb 12.8 oz (72.9 kg)  ?08/31/21 160 lb (72.6 kg)  ? ?29.86 kg/m? ? ?Heartburn started to get worse this winder  ? ?Now a burning /tingling feeling on L side of chest  ?Travels to the front of shoulder  ?Over the weekend had worse indigestion and feels like food sits in chest half way down  ?? If she has a HH  ?Cpap may make reflux worse  ? ?Chest symptoms only correlate with heartburn  ?Also occ when she turns her head to the L  (with tingling in hand)  ?Not exertional  ?Sometimes fleeting  ? ?No sob or nausea or sweating  ? ?Anxiety is worse  ?She has to quit work after she had covid and then a viral infection and then a GI virus  ? ? ?HTN ?bp is stable today  ?No cp or palpitations or headaches or edema  ?No side effects to medicines  ?BP Readings from Last 3 Encounters:  ?12/17/21 (!) 158/88  ?10/05/21 140/82  ?08/31/21 (!) 158/72  ? ? ? ?EKG today NSR with rate of 74 and no acute changes  ? ?Pulse Readings from Last 3 Encounters:  ?12/17/21 81  ?10/05/21 83  ?05/16/21 82  ? ?Lisinopril 20 mg daily  ? ? ?Had echo in 2012 at [facility] cardiology  ?Nl with grade 1 DD ? ?Hyperlipidemia ?Lab Results  ?Component Value Date  ? CHOL 135 04/10/2021  ? HDL 55.60 04/10/2021  ? [place] 64 04/10/2021  ? TRIG 79.0 04/10/2021  ? CHOLHDL 2 04/10/2021  ? ?The 10-year ASCVD risk score (Arnett DK, et al., 2019) is: 27.5% ?  Values used to calculate the score: ?    Age: 44 years ?    Sex: Female ?    Is Non-Hispanic African American: No ?    Diabetic: No ?    Tobacco smoker: No ?    Systolic Blood Pressure: 0000000 mmHg ?    Is BP treated: Yes ?    HDL Cholesterol: 55.6 mg/dL ?    Total Cholesterol: 135 mg/dL ? ?On cpap for OSA ? ?GERD ?Esoph dilatatoin in the past  ?Last GI appt may 2022 ?Had considered EGD but  had insurance problem  (could not have that and colonoscopy done at the same time)  ? ? ?Omeprazole 20 mg bid  ?Pepcid -not taking currently  ? ?Eating more bugles/snack food than usual  ? ? 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 ?Patient Active Problem List  ? Diagnosis Date Noted  ? OSA (obstructive sleep apnea) 10/05/2021  ? COVID-19 08/31/2021  ? Chest discomfort 08/01/2021  ? Sleep apnea 01/29/2021  ? Chronic headaches 12/07/2020  ? Hyponatremia 04/12/2020  ? Screening for colon cancer 10/29/2019  ? Stress reaction 08/31/2019  ? Breast cancer screening by mammogram 12/08/2017  ? Estrogen deficiency 12/04/2016  ? Grief reaction 09/09/2014  ? Adjustment disorder with depressed mood 09/09/2014  ? 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  ?  Prediabetes 08/12/2012  ? Other screening mammogram 03/22/2011  ? Hyperlipidemia 01/01/2011  ? Hypertension 01/01/2011  ? GERD (gastroesophageal reflux disease) 01/01/2011  ? Routine general medical examination at a health care facility 01/01/2011  ? Vitamin D deficiency disease 01/01/2011  ? Osteopenia 01/01/2011  ? Anxiety 01/01/2011  ? Allergic rhinitis 01/01/2011  ? Rosacea 01/01/2011  ? Disorder of bone and cartilage 01/01/2011  ? ?Past Medical History:  ?Diagnosis Date  ? Allergy   ? Bronchitis   ? Colon polyps   ? Depression   ? GERD (gastroesophageal reflux disease)   ? Heart murmur   ? History of chicken pox   ? Hx: UTI (urinary tract infection)   ? Hyperlipidemia   ? Hypertension   ? Migraines   ? PONV (postoperative nausea and vomiting)   ? Rosacea   ? Sleep apnea   ? uses cpap every night   ? ?Past Surgical History:  ?Procedure Laterality Date  ? COLONOSCOPY WITH PROPOFOL N/A 01/25/2021  ? Procedure: COLONOSCOPY WITH PROPOFOL;  Surgeon: Lin Landsman, MD;   Location: [facility] ENDOSCOPY;  Service: Gastroenterology;  Laterality: N/A;  ? [place]    ? FOOT SURGERY    ? ?Social History  ? ?Tobacco Use  ? Smoking status: Former  ?  Packs/day: 0.50  ?  Years: 4.00  ?  Pack years: 2.00  ?  Types: Cigarettes  ?  Quit date: 63  ?  Years since quitting: 43.3  ? Smokeless tobacco: Never  ?Vaping Use  ? Vaping Use: Never used  ?Substance Use Topics  ? Alcohol use: Yes  ?  Alcohol/week: 0.0 standard drinks  ?  Comment: wine occasional  ? Drug use: No  ? ?Family History  ?Problem Relation Age of Onset  ? Heart disease Father   ? Hypertension Father   ? Diabetes Other   ? Heart disease Mother   ? Stroke Paternal Grandmother   ? Heart disease Paternal Grandfather   ? Hypertension Paternal Grandfather   ? Breast cancer Paternal Aunt   ? Breast cancer Sister   ? Cancer Sister   ? Bladder Cancer Neg Hx   ? Kidney cancer Neg Hx   ? ?Allergies  ?Allergen Reactions  ? Shellfish Allergy Anaphylaxis  ?  Sob/ swelling  ? Amlodipine   ?  headache  ? ?Current Outpatient Medications on File Prior to Visit  ?Medication Sig Dispense Refill  ? aspirin 81 MG tablet Take 81 mg by mouth daily.    ? busPIRone (BUSPAR) 15 MG tablet TAKE 1 TABLET BY MOUTH TWICE A DAY 180 tablet 2  ? lisinopril (ZESTRIL) 20 MG tablet Take 1 tablet (20 mg total) by mouth daily. 90 tablet 3  ? loratadine (CLARITIN) 10 MG tablet Take 10 mg by mouth daily.    ? metroNIDAZOLE (METROGEL) 1 % gel Apply topically 2 (two) times daily. 180 g 3  ? omeprazole (PRILOSEC) 20 MG capsule TAKE 1 CAPSULE BY MOUTH TWICE A DAY 180 capsule 2  ? sertraline (ZOLOFT) 50 MG tablet TAKE 1 TABLET BY MOUTH EVERY DAY 90 tablet 1  ? simvastatin (ZOCOR) 20 MG tablet TAKE 1 TABLET BY MOUTH DAILY AT 6 PM. 90 tablet 1  ? amoxicillin-clavulanate (AUGMENTIN) 875-125 MG tablet Take 1 tablet by mouth 2 (two) times daily. (Patient not taking: Reported on 12/17/2021)    ? predniSONE (DELTASONE) 20 MG tablet Take 20 mg by mouth daily. (Patient not taking: Reported on  12/17/2021)    ? promethazine-dextromethorphan (  PROMETHAZINE-DM) 6.25-15 MG/5ML syrup Take by mouth. (Patient not taking: Reported on 12/17/2021)    ? ?No current facility-administered medications on file prior to visit.  ?  ? ?Review of Systems  ?Constitutional:  Positive for fatigue. Negative for activity change, appetite change, fever and unexpected weight change.  ?HENT:  Negative for congestion, ear pain, rhinorrhea, sinus pressure and sore throat.   ?Eyes:  Negative for pain, redness and visual disturbance.  ?Respiratory:  Negative for cough, shortness of breath and wheezing.   ?Cardiovascular:  Positive for chest pain. Negative for palpitations and leg swelling.  ?Gastrointestinal:  Negative for abdominal distention, abdominal pain, blood in stool, constipation, diarrhea, nausea, rectal pain and vomiting.  ?     Heartburn  ?Burning/tingling in chest  ? ?No n/v  ?Endocrine: Negative for polydipsia and polyuria.  ?Genitourinary:  Negative for dysuria, frequency and urgency.  ?Musculoskeletal:  Negative for arthralgias, back pain and myalgias.  ?Skin:  Negative for pallor and rash.  ?Allergic/Immunologic: Negative for environmental allergies.  ?Neurological:  Negative for dizziness, syncope and headaches.  ?Hematological:  Negative for adenopathy. Does not bruise/bleed easily.  ?Psychiatric/Behavioral:  Negative for decreased concentration, dysphoric mood and suicidal ideas. The patient is nervous/anxious.   ? ?   ?Objective:  ? Physical Exam ?Constitutional:   ?   General: She is not in acute distress. ?   Appearance: She is well-developed.  ?   Comments: overwt  ?HENT:  ?   Head: Normocephalic and atraumatic.  ?Eyes:  ?   Conjunctiva/sclera: Conjunctivae normal.  ?   Pupils: Pupils are equal, round, and reactive to light.  ?Neck:  ?   Thyroid: No thyromegaly.  ?   Vascular: No carotid bruit or JVD.  ?Cardiovascular:  ?   Rate and Rhythm: Normal rate and regular rhythm.  ?   Pulses: Normal pulses.  ?   Heart  sounds: Normal heart sounds.  ?  No gallop.  ?Pulmonary:  ?   Effort: Pulmonary effort is normal. No respiratory distress.  ?   Breath sounds: Normal breath sounds. No stridor. No wheezing, rhonchi or rales.

## 2021-12-17 NOTE — Assessment & Plan Note (Addendum)
Worse lately  ?Has omeprazole 20 mg bid ?Worse with cpap/may have to elevate head of bed ?Chest discomfort correlates with these symptoms ?Also feels like food gets stuck in esophagus (past h/o stricture)  ?Recommend GI f/u with likely EGD ?Will also add pepcid 20 mg at night ?Enc to watch diet for triggers and stop late eating ?She prefers new provider/will refer to [facility] clinic and update  ? ?

## 2021-12-17 NOTE — Assessment & Plan Note (Signed)
cpap works well but gives her more acid reflux  ? ?Enc to try a foam wedge to elevate head of bed ?

## 2021-12-17 NOTE — Assessment & Plan Note (Signed)
bp is up from baseline  ?This is likely due to anxiety/anxious mood lately  ?BP: (!) 158/88 ? ?  ?Taking lisinopril 20 mg daily ? ?Will increase zoloft and see if this helps  ?If not consider inc the lisinopril or adding hctz ?Of note-cannot take amlodipine  ? ?

## 2021-12-18 ENCOUNTER — Ambulatory Visit: Payer: Medicare HMO | Admitting: Family Medicine

## 2021-12-18 DIAGNOSIS — G4733 Obstructive sleep apnea (adult) (pediatric): Secondary | ICD-10-CM | POA: Diagnosis not present

## 2021-12-24 ENCOUNTER — Telehealth: Payer: Self-pay | Admitting: Family Medicine

## 2021-12-24 NOTE — Telephone Encounter (Signed)
Pt calling in re: GI Referral, patient states she was told to check back in 1 week, did inform patient that referral is in and requested location is [place] [facility] ? ?Please contact patient with next steps. ? ?[phone]  ?

## 2021-12-25 NOTE — Telephone Encounter (Signed)
Referral sent to [facility] ? ?Called patient, line rang several times, no answer. ? ?Mychart message sent to patient making aware this has been faxed per her request.  ? ?Nothing further needed.  ? ?

## 2022-01-08 ENCOUNTER — Other Ambulatory Visit: Payer: Self-pay | Admitting: Family Medicine

## 2022-01-08 NOTE — Telephone Encounter (Signed)
Is this okay to refill? 

## 2022-01-12 DIAGNOSIS — G4733 Obstructive sleep apnea (adult) (pediatric): Secondary | ICD-10-CM | POA: Diagnosis not present

## 2022-02-05 ENCOUNTER — Telehealth: Payer: Self-pay | Admitting: Family Medicine

## 2022-02-05 MED ORDER — BUSPIRONE HCL 15 MG PO TABS
15.0000 mg | ORAL_TABLET | Freq: Two times a day (BID) | ORAL | 3 refills | Status: DC
Start: 2022-02-05 — End: 2023-02-19

## 2022-02-05 NOTE — Telephone Encounter (Signed)
Patient will need refill of buspar when it is time to get renewed next time. It is [facility].

## 2022-02-05 NOTE — Telephone Encounter (Signed)
I sent it  

## 2022-02-06 ENCOUNTER — Other Ambulatory Visit: Payer: Self-pay

## 2022-02-12 DIAGNOSIS — G4733 Obstructive sleep apnea (adult) (pediatric): Secondary | ICD-10-CM | POA: Diagnosis not present

## 2022-02-25 ENCOUNTER — Other Ambulatory Visit: Payer: Self-pay | Admitting: Family Medicine

## 2022-02-27 NOTE — Telephone Encounter (Signed)
Is this okay to refill? 

## 2022-02-27 NOTE — Telephone Encounter (Signed)
Pt called in about this medication: busPIRone (BUSPAR) 15 MG tablet. Pt never picked this medication up but she just received an e-mail from [facility] on the 3rd of July that it is ready to be picked up so she is going to pick that up today. She will call back if she has any problems, just letting you know!  Callback Number: [phone]

## 2022-03-12 ENCOUNTER — Encounter: Payer: Self-pay | Admitting: Family Medicine

## 2022-03-12 DIAGNOSIS — M8580 Other specified disorders of bone density and structure, unspecified site: Secondary | ICD-10-CM | POA: Diagnosis not present

## 2022-03-12 DIAGNOSIS — R69 Illness, unspecified: Secondary | ICD-10-CM | POA: Diagnosis not present

## 2022-03-12 DIAGNOSIS — K219 Gastro-esophageal reflux disease without esophagitis: Secondary | ICD-10-CM | POA: Diagnosis not present

## 2022-03-12 DIAGNOSIS — I10 Essential (primary) hypertension: Secondary | ICD-10-CM | POA: Diagnosis not present

## 2022-03-12 DIAGNOSIS — R1314 Dysphagia, pharyngoesophageal phase: Secondary | ICD-10-CM | POA: Insufficient documentation

## 2022-03-14 DIAGNOSIS — G4733 Obstructive sleep apnea (adult) (pediatric): Secondary | ICD-10-CM | POA: Diagnosis not present

## 2022-03-15 ENCOUNTER — Encounter: Payer: Self-pay | Admitting: *Deleted

## 2022-03-15 ENCOUNTER — Other Ambulatory Visit: Payer: Self-pay | Admitting: Family Medicine

## 2022-03-20 ENCOUNTER — Encounter: Payer: Self-pay | Admitting: Family Medicine

## 2022-03-20 MED ORDER — OLMESARTAN MEDOXOMIL 40 MG PO TABS
40.0000 mg | ORAL_TABLET | Freq: Every day | ORAL | 3 refills | Status: DC
Start: 2022-03-20 — End: 2023-03-07

## 2022-03-26 DIAGNOSIS — K2970 Gastritis, unspecified, without bleeding: Secondary | ICD-10-CM | POA: Diagnosis not present

## 2022-03-26 DIAGNOSIS — K219 Gastro-esophageal reflux disease without esophagitis: Secondary | ICD-10-CM | POA: Diagnosis not present

## 2022-03-26 DIAGNOSIS — R1314 Dysphagia, pharyngoesophageal phase: Secondary | ICD-10-CM | POA: Diagnosis not present

## 2022-04-09 ENCOUNTER — Encounter: Payer: Self-pay | Admitting: Family Medicine

## 2022-04-09 ENCOUNTER — Ambulatory Visit (INDEPENDENT_AMBULATORY_CARE_PROVIDER_SITE_OTHER): Payer: Medicare HMO | Admitting: *Deleted

## 2022-04-09 DIAGNOSIS — Z0000 Encounter for general adult medical examination without abnormal findings: Secondary | ICD-10-CM

## 2022-04-09 NOTE — Patient Instructions (Signed)
Adriana Marks , Thank you for taking time to come for your Medicare Wellness Visit. I appreciate your ongoing commitment to your health goals. Please review the following plan we discussed and let me know if I can assist you in the future.   Screening recommendations/referrals: Colonoscopy: up to date Mammogram: up to date Bone Density: Education provided Recommended yearly ophthalmology/optometry visit for glaucoma screening and checkup Recommended yearly dental visit for hygiene and checkup  Vaccinations: Influenza vaccine: up to date Pneumococcal vaccine: up to date Tdap vaccine: Education provided Shingles vaccine: up to date        Preventive Care 74 Years and Older, Female Preventive care refers to lifestyle choices and visits with your health care provider that can promote health and wellness. What does preventive care include? A yearly physical exam. This is also called an annual well check. Dental exams once or twice a year. Routine eye exams. Ask your health care provider how often you should have your eyes checked. Personal lifestyle choices, including: Daily care of your teeth and gums. Regular physical activity. Eating a healthy diet. Avoiding tobacco and drug use. Limiting alcohol use. Practicing safe sex. Taking low-dose aspirin every day. Taking vitamin and mineral supplements as recommended by your health care provider. What happens during an annual well check? The services and screenings done by your health care provider during your annual well check will depend on your age, overall health, lifestyle risk factors, and family history of disease. Counseling  Your health care provider may ask you questions about your: Alcohol use. Tobacco use. Drug use. Emotional well-being. Home and relationship well-being. Sexual activity. Eating habits. History of falls. Memory and ability to understand (cognition). Work and work Astronomer. Reproductive  health. Screening  You may have the following tests or measurements: Height, weight, and BMI. Blood pressure. Lipid and cholesterol levels. These may be checked every 5 years, or more frequently if you are over 74 years old. Skin check. Lung cancer screening. You may have this screening every year starting at age 60 if you have a 30-pack-year history of smoking and currently smoke or have quit within the past 15 years. Fecal occult blood test (FOBT) of the stool. You may have this test every year starting at age 74. Flexible sigmoidoscopy or colonoscopy. You may have a sigmoidoscopy every 5 years or a colonoscopy every 10 years starting at age 74. Hepatitis C blood test. Hepatitis B blood test. Sexually transmitted disease (STD) testing. Diabetes screening. This is done by checking your blood sugar (glucose) after you have not eaten for a while (fasting). You may have this done every 1-3 years. Bone density scan. This is done to screen for osteoporosis. You may have this done starting at age 74. Mammogram. This may be done every 1-2 years. Talk to your health care provider about how often you should have regular mammograms. Talk with your health care provider about your test results, treatment options, and if necessary, the need for more tests. Vaccines  Your health care provider may recommend certain vaccines, such as: Influenza vaccine. This is recommended every year. Tetanus, diphtheria, and acellular pertussis (Tdap, Td) vaccine. You may need a Td booster every 10 years. Zoster vaccine. You may need this after age 21. Pneumococcal 13-valent conjugate (PCV13) vaccine. One dose is recommended after age 74. Pneumococcal polysaccharide (PPSV23) vaccine. One dose is recommended after age 68. Talk to your health care provider about which screenings and vaccines you need and how often you need them. This  information is not intended to replace advice given to you by your health care provider.  Make sure you discuss any questions you have with your health care provider. Document Released: 09/08/2015 Document Revised: 05/01/2016 Document Reviewed: 06/13/2015 Elsevier Interactive Patient Education  2017 [place] Prevention in the Home Falls can cause injuries. They can happen to people of all ages. There are many things you can do to make your home safe and to help prevent falls. What can I do on the outside of my home? Regularly fix the edges of walkways and driveways and fix any cracks. Remove anything that might make you trip as you walk through a door, such as a raised step or threshold. Trim any bushes or trees on the path to your home. Use bright outdoor lighting. Clear any walking paths of anything that might make someone trip, such as rocks or tools. Regularly check to see if handrails are loose or broken. Make sure that both sides of any steps have handrails. Any raised decks and porches should have guardrails on the edges. Have any leaves, snow, or ice cleared regularly. Use sand or salt on walking paths during winter. Clean up any spills in your garage right away. This includes oil or grease spills. What can I do in the bathroom? Use night lights. Install grab bars by the toilet and in the tub and shower. Do not use towel bars as grab bars. Use non-skid mats or decals in the tub or shower. If you need to sit down in the shower, use a plastic, non-slip stool. Keep the floor dry. Clean up any water that spills on the floor as soon as it happens. Remove soap buildup in the tub or shower regularly. Attach bath mats securely with double-sided non-slip rug tape. Do not have throw rugs and other things on the floor that can make you trip. What can I do in the bedroom? Use night lights. Make sure that you have a light by your bed that is easy to reach. Do not use any sheets or blankets that are too big for your bed. They should not hang down onto the floor. Have a  firm chair that has side arms. You can use this for support while you get dressed. Do not have throw rugs and other things on the floor that can make you trip. What can I do in the kitchen? Clean up any spills right away. Avoid walking on wet floors. Keep items that you use a lot in easy-to-reach places. If you need to reach something above you, use a strong step stool that has a grab bar. Keep electrical cords out of the way. Do not use floor polish or wax that makes floors slippery. If you must use wax, use non-skid floor wax. Do not have throw rugs and other things on the floor that can make you trip. What can I do with my stairs? Do not leave any items on the stairs. Make sure that there are handrails on both sides of the stairs and use them. Fix handrails that are broken or loose. Make sure that handrails are as long as the stairways. Check any carpeting to make sure that it is firmly attached to the stairs. Fix any carpet that is loose or worn. Avoid having throw rugs at the top or bottom of the stairs. If you do have throw rugs, attach them to the floor with carpet tape. Make sure that you have a light switch at the top  of the stairs and the bottom of the stairs. If you do not have them, ask someone to add them for you. What else can I do to help prevent falls? Wear shoes that: Do not have high heels. Have rubber bottoms. Are comfortable and fit you well. Are closed at the toe. Do not wear sandals. If you use a stepladder: Make sure that it is fully opened. Do not climb a closed stepladder. Make sure that both sides of the stepladder are locked into place. Ask someone to hold it for you, if possible. Clearly mark and make sure that you can see: Any grab bars or handrails. First and last steps. Where the edge of each step is. Use tools that help you move around (mobility aids) if they are needed. These include: Canes. Walkers. Scooters. Crutches. Turn on the lights when you  go into a dark area. Replace any light bulbs as soon as they burn out. Set up your furniture so you have a clear path. Avoid moving your furniture around. If any of your floors are uneven, fix them. If there are any pets around you, be aware of where they are. Review your medicines with your doctor. Some medicines can make you feel dizzy. This can increase your chance of falling. Ask your doctor what other things that you can do to help prevent falls. This information is not intended to replace advice given to you by your health care provider. Make sure you discuss any questions you have with your health care provider. Document Released: 06/08/2009 Document Revised: 01/18/2016 Document Reviewed: 09/16/2014 Elsevier Interactive Patient Education  2017 [employer].

## 2022-04-09 NOTE — Progress Notes (Signed)
Subjective:   Adriana Marks is a 76 y.o. female who presents for Medicare Annual (Subsequent) preventive examination.  I connected with  Daryel November on 04/09/22 by a telephone enabled telemedicine application and verified that I am speaking with the correct person using two identifiers.   I discussed the limitations of evaluation and management by telemedicine. The patient expressed understanding and agreed to proceed.  Patient location: home  Provider location:  Tele-Health-home    Review of Systems     Cardiac Risk Factors include: advanced age (>22men, >80 women);family history of premature cardiovascular disease;hypertension     Objective:    Today's Vitals   There is no height or weight on file to calculate BMI.     04/09/2022   11:35 AM 03/05/2021   11:56 AM 01/25/2021    7:39 AM 04/05/2020    9:05 AM 11/14/2017   10:00 AM 11/13/2016    9:30 PM 11/06/2015   10:38 AM  Advanced Directives  Does Patient Have a Medical Advance Directive? Yes No Yes Yes Yes Yes Yes  Type of Primary school teacher of [place];Living will [place];Living will [place];Living will [place];Living will  Does patient want to make changes to medical advance directive?       No - Patient declined  Copy of [place] in Chart? Yes - validated most recent copy scanned in chart (See row information)   No - copy requested No - copy requested No - copy requested No - copy requested    Current Medications (verified) Outpatient Encounter Medications as of 04/09/2022  Medication Sig   aspirin 81 MG tablet Take 81 mg by mouth daily.   busPIRone (BUSPAR) 15 MG tablet Take 1 tablet (15 mg total) by mouth 2 (two) times daily.   famotidine (PEPCID) 20 MG tablet Take 1 tablet (20 mg total) by mouth at bedtime.   loratadine (CLARITIN) 10 MG tablet Take 10 mg by mouth daily.   metroNIDAZOLE  (METROGEL) 1 % gel Apply topically 2 (two) times daily.   olmesartan (BENICAR) 40 MG tablet Take 1 tablet (40 mg total) by mouth daily.   omeprazole (PRILOSEC) 20 MG capsule TAKE 1 CAPSULE BY MOUTH TWICE A DAY   sertraline (ZOLOFT) 50 MG tablet Take 2 tablets (100 mg total) by mouth daily.   simvastatin (ZOCOR) 20 MG tablet TAKE 1 TABLET BY MOUTH DAILY AT 6 PM.   amoxicillin-clavulanate (AUGMENTIN) 875-125 MG tablet Take 1 tablet by mouth 2 (two) times daily. (Patient not taking: Reported on 12/17/2021)   predniSONE (DELTASONE) 20 MG tablet Take 20 mg by mouth daily. (Patient not taking: Reported on 12/17/2021)   promethazine-dextromethorphan (PROMETHAZINE-DM) 6.25-15 MG/5ML syrup Take by mouth. (Patient not taking: Reported on 12/17/2021)   sertraline (ZOLOFT) 50 MG tablet TAKE 1 TABLET BY MOUTH EVERY DAY   No facility-administered encounter medications on file as of 04/09/2022.    Allergies (verified) Shellfish allergy and Amlodipine   History: Past Medical History:  Diagnosis Date   Allergy    Bronchitis    Colon polyps    Depression    GERD (gastroesophageal reflux disease)    Heart murmur    History of chicken pox    Hx: UTI (urinary tract infection)    Hyperlipidemia    Hypertension    Migraines    PONV (postoperative nausea and vomiting)    Rosacea    Sleep  apnea    uses cpap every night    Past Surgical History:  Procedure Laterality Date   COLONOSCOPY WITH PROPOFOL N/A 01/25/2021   Procedure: COLONOSCOPY WITH PROPOFOL;  Surgeon: Lin Landsman, MD;  Location: [facility] ENDOSCOPY;  Service: Gastroenterology;  Laterality: N/A;   DNC     FOOT SURGERY     Family History  Problem Relation Age of Onset   Heart disease Father    Hypertension Father    Diabetes Other    Heart disease Mother    Stroke Paternal Grandmother    Heart disease Paternal Grandfather    Hypertension Paternal Grandfather    Breast cancer Paternal Aunt    Breast cancer Sister    Cancer Sister     Bladder Cancer Neg Hx    Kidney cancer Neg Hx    Social History   Socioeconomic History   Marital status: Widowed    Spouse name: Not on file   Number of children: Not on file   Years of education: Not on file   Highest education level: Not on file  Occupational History   Occupation: Engineer, mining: [employer]  Tobacco Use   Smoking status: Former    Packs/day: 0.50    Years: 4.00    Total pack years: 2.00    Types: Cigarettes    Quit date: 1980    Years since quitting: 43.6   Smokeless tobacco: Never  Vaping Use   Vaping Use: Never used  Substance and Sexual Activity   Alcohol use: Yes    Alcohol/week: 0.0 standard drinks of alcohol    Comment: wine occasional   Drug use: No   Sexual activity: Never  Other Topics Concern   Not on file  Social History Narrative   Exercises 3 or more times per week walks 30 minutes at a time       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    Overload all the time    Plans to retire in 2014 if possible       Husband with heart disease -- has had bypasses                Social Determinants of Health   Financial Resource Strain: Low Risk  (04/09/2022)   Overall Financial Resource Strain (CARDIA)    Difficulty of Paying Living Expenses: Not hard at all  Food Insecurity: No Food Insecurity (04/09/2022)   Hunger Vital Sign    Worried About Running Out of Food in the Last Year: Never true    [place] in the Last Year: Never true  Transportation Needs: No Transportation Needs (04/09/2022)   PRAPARE - Hydrologist (Medical): No    Lack of Transportation (Non-Medical): No  Physical Activity: Insufficiently Active (04/09/2022)   Exercise Vital Sign    Days of Exercise per Week: 3 days    Minutes of Exercise per Session: 30 min  Stress: No Stress Concern Present (04/09/2022)   [place]    Feeling of Stress :  Not at all  Social Connections: Moderately Integrated (04/09/2022)   Social Connection and Isolation Panel [NHANES]    Frequency of Communication with Friends and Family: More than three times a week    Frequency of Social Gatherings with Friends and Family: Twice a week    Attends Religious Services: More than 4 times per year  Active Member of Clubs or Organizations: Yes    Attends Archivist Meetings: More than 4 times per year    Marital Status: Widowed    Tobacco Counseling Counseling given: Not Answered   Clinical Intake:  Pre-visit preparation completed: Yes  Pain : No/denies pain     Nutritional Risks: None Diabetes: No  How often do you need to have someone help you when you read instructions, pamphlets, or other written materials from your doctor or pharmacy?: 1 - Never  Diabetic?  no  Interpreter Needed?: No  Information entered by :: Leroy Kennedy LPN   Activities of Daily Living    04/09/2022   11:42 AM  In your present state of health, do you have any difficulty performing the following activities:  Hearing? 0  Vision? 0  Difficulty concentrating or making decisions? 0  Walking or climbing stairs? 0  Dressing or bathing? 0  Doing errands, shopping? 0  Preparing Food and eating ? N  Using the Toilet? N  In the past six months, have you accidently leaked urine? N  Do you have problems with loss of bowel control? N  Managing your Medications? N  Managing your Finances? N  Housekeeping or managing your Housekeeping? N    Patient Care Team: [name], Wynelle Fanny, MD as PCP - General (Family Medicine) Dannielle Karvonen, RN as [place] any recent [place] you may have received from other than [facility] providers in the past year (date may be approximate).     Assessment:   This is a routine wellness examination for [facility].  Hearing/Vision screen Hearing Screening - Comments:: No trouble hearing Vision  Screening - Comments:: Up to date [place] [facility]  Dietary issues and exercise activities discussed: Current Exercise Habits: Home exercise routine, Type of exercise: walking, Time (Minutes): 30, Frequency (Times/Week): 3, Weekly Exercise (Minutes/Week): 90, Intensity: Mild   Goals Addressed             This Visit's Progress    Increase physical activity       Healthy eating       Depression Screen    04/09/2022   11:41 AM 04/16/2021    2:35 PM 01/29/2021    9:01 AM 12/07/2020   12:40 PM 04/05/2020    9:07 AM 10/29/2019   10:47 AM 04/05/2019   10:16 AM  PHQ 2/9 Scores  PHQ - 2 Score 0 2 1 4  0 4 0  PHQ- 9 Score  4 3 15  0 6     Fall Risk    04/09/2022   11:35 AM 04/16/2021    2:34 PM 06/12/2020    9:49 AM 04/05/2020    9:07 AM 04/05/2019    9:27 AM  [place] in the past year? 0 0 0 0 0  Number falls in past yr: 0 0  0 0 0  Comment  Zero falls in the past year     Injury with Fall? 0  0 0 0  Risk for fall due to :    Medication side effect   Follow up Falls evaluation completed;Education provided;Falls prevention discussed Falls evaluation completed Falls evaluation completed Falls evaluation completed;Falls prevention discussed Falls evaluation completed     Significant value    FALL RISK PREVENTION PERTAINING TO THE HOME:  Any stairs in or around the home? No  If so, are there any without handrails? No  Home free of loose throw rugs in  walkways, pet beds, electrical cords, etc? Yes  Adequate lighting in your home to reduce risk of falls? Yes   ASSISTIVE DEVICES UTILIZED TO PREVENT FALLS:  Life alert? No  Use of a cane, walker or w/c? No  Grab bars in the bathroom? No  Shower chair or bench in shower? No  Elevated toilet seat or a handicapped toilet? No   TIMED UP AND GO:  Was the test performed? No .    Cognitive Function:    04/05/2020    9:09 AM 11/14/2017    9:45 AM 11/13/2016    9:25 AM 11/06/2015   10:41 AM  MMSE - Mini Mental State Exam   Orientation to time 5 5 5 5   Orientation to Place 5 5 5 5   Registration 3 3 3 3   Attention/ Calculation 5 0 0 5  Recall 3 3 3 3   Language- name 2 objects  0 0 0  Language- repeat 1 1 1 1   Language- follow 3 step command  3 3 3   Language- read & follow direction  0 0 1  Write a sentence  0 0 0  Copy design  0 0 0  Total score  20 20 26         04/09/2022   11:36 AM  6CIT Screen  What Year? 0 points  What month? 0 points  What time? 0 points  Count back from 20 0 points  Months in reverse 0 points  Repeat phrase 0 points  Total Score 0 points    Immunizations Immunization History  Administered Date(s) Administered   Influenza, High Dose Seasonal PF 05/02/2017, 05/06/2018, 06/07/2020, 06/17/2021   Influenza,inj,Quad PF,6+ Mos 06/04/2019   Influenza-Unspecified 05/18/2013, 05/11/2016   PFIZER(Purple Top)SARS-COV-2 Vaccination 10/18/2019, 11/09/2019, 06/18/2020   PPD Test 02/02/2013, 04/24/2016, 12/26/2020   Pneumococcal Conjugate-13 09/09/2014   Pneumococcal Polysaccharide-23 11/14/2017   Pneumococcal-Unspecified 08/27/2012   Tdap 01/01/2011   Zoster Recombinat (Shingrix) 11/20/2021, 03/12/2022   Zoster, Live 08/13/2012    TDAP status: Due, Education has been provided regarding the importance of this vaccine. Advised may receive this vaccine at local pharmacy or Health Dept. Aware to provide a copy of the vaccination record if obtained from local pharmacy or Health Dept. Verbalized acceptance and understanding.  Flu Vaccine status: Up to date  Pneumococcal vaccine status: Up to date  Covid-19 vaccine status: Information provided on how to obtain vaccines.   Qualifies for Shingles Vaccine? No   Zostavax completed Yes   Shingrix Completed?: Yes  Screening Tests Health Maintenance  Topic Date Due   INFLUENZA VACCINE  03/26/2022   COVID-19 Vaccine (4 - Pfizer series) 04/25/2022 (Originally 08/13/2020)   TETANUS/TDAP  04/17/2031 (Originally 12/31/2020)   MAMMOGRAM   05/01/2022   COLONOSCOPY (Pts 45-44yrs Insurance coverage will need to be confirmed)  01/26/2031   Pneumonia Vaccine 54+ Years old  Completed   DEXA SCAN  Completed   Hepatitis C Screening  Completed   Zoster Vaccines- Shingrix  Completed   HPV VACCINES  Aged Out    Health Maintenance  Health Maintenance Due  Topic Date Due   INFLUENZA VACCINE  03/26/2022    Colorectal cancer screening: Type of screening: Colonoscopy. Completed 2022. Repeat every 10 years  Mammogram status: Completed  . Repeat every year  Bone Density status: Completed 2018. Results reflect: Bone density results: NORMAL. Repeat every 2 years.  Lung Cancer Screening: (Low Dose CT Chest recommended if Age 11-80 years, 30 pack-year currently smoking OR have quit w/in 15years.)  does not qualify.   Lung Cancer Screening Referral:   Additional Screening:  Hepatitis C Screening: does not qualify; Completed 2017  Vision Screening: Recommended annual ophthalmology exams for early detection of glaucoma and other disorders of the eye. Is the patient up to date with their annual eye exam?  Yes  Who is the provider or what is the name of the office in which the patient attends annual eye exams? [facility] If pt is not established with a provider, would they like to be referred to a provider to establish care? No .   Dental Screening: Recommended annual dental exams for proper oral hygiene  Community Resource Referral / Chronic Care Management: CRR required this visit?  No   CCM required this visit?  No      Plan:     I have personally reviewed and noted the following in the patient's chart:   Medical and social history Use of alcohol, tobacco or illicit drugs  Current medications and supplements including opioid prescriptions.  Functional ability and status Nutritional status Physical activity Advanced directives List of other physicians Hospitalizations, surgeries, and ER visits in previous 12  months Vitals Screenings to include cognitive, depression, and falls Referrals and appointments  In addition, I have reviewed and discussed with patient certain preventive protocols, quality metrics, and best practice recommendations. A written personalized care plan for preventive services as well as general preventive health recommendations were provided to patient.     Remi Haggard, LPN   7/97/2820   Nurse Notes:

## 2022-04-14 DIAGNOSIS — G4733 Obstructive sleep apnea (adult) (pediatric): Secondary | ICD-10-CM | POA: Diagnosis not present

## 2022-05-02 ENCOUNTER — Telehealth: Payer: Self-pay | Admitting: Family Medicine

## 2022-05-02 DIAGNOSIS — I10 Essential (primary) hypertension: Secondary | ICD-10-CM

## 2022-05-02 DIAGNOSIS — E559 Vitamin D deficiency, unspecified: Secondary | ICD-10-CM

## 2022-05-02 DIAGNOSIS — E7800 Pure hypercholesterolemia, unspecified: Secondary | ICD-10-CM

## 2022-05-02 DIAGNOSIS — R7303 Prediabetes: Secondary | ICD-10-CM

## 2022-05-02 NOTE — Telephone Encounter (Signed)
-----   Message from Alvina Chou sent at 04/24/2022 12:09 PM EDT ----- Regarding: Lab orders for Friday, 9.8.23 Patient is scheduled for CPX labs, please order future labs, Thanks , Camelia Eng

## 2022-05-03 ENCOUNTER — Other Ambulatory Visit (INDEPENDENT_AMBULATORY_CARE_PROVIDER_SITE_OTHER): Payer: Medicare HMO

## 2022-05-03 DIAGNOSIS — R7303 Prediabetes: Secondary | ICD-10-CM

## 2022-05-03 DIAGNOSIS — E7800 Pure hypercholesterolemia, unspecified: Secondary | ICD-10-CM

## 2022-05-03 DIAGNOSIS — E559 Vitamin D deficiency, unspecified: Secondary | ICD-10-CM

## 2022-05-03 DIAGNOSIS — I10 Essential (primary) hypertension: Secondary | ICD-10-CM

## 2022-05-03 LAB — HEMOGLOBIN A1C: Hgb A1c MFr Bld: 5.7 % (ref 4.6–6.5)

## 2022-05-03 LAB — COMPREHENSIVE METABOLIC PANEL
ALT: 15 U/L (ref 0–35)
AST: 19 U/L (ref 0–37)
Albumin: 4.2 g/dL (ref 3.5–5.2)
Alkaline Phosphatase: 57 U/L (ref 39–117)
BUN: 11 mg/dL (ref 6–23)
CO2: 28 mEq/L (ref 19–32)
Calcium: 9.7 mg/dL (ref 8.4–10.5)
Chloride: 96 mEq/L (ref 96–112)
Creatinine, Ser: 0.77 mg/dL (ref 0.40–1.20)
GFR: 76.01 mL/min (ref >60.00)
Glucose, Bld: 89 mg/dL (ref 70–99)
Potassium: 4.4 mEq/L (ref 3.5–5.1)
Sodium: 130 mEq/L — ABNORMAL LOW (ref 135–145)
Total Bilirubin: 0.4 mg/dL (ref 0.2–1.2)
Total Protein: 7.5 g/dL (ref 6.0–8.3)

## 2022-05-03 LAB — CBC WITH DIFFERENTIAL/PLATELET
Basophils Absolute: 0 10*3/uL (ref 0.0–0.1)
Basophils Relative: 0.7 % (ref 0.0–3.0)
Eosinophils Absolute: 0.2 10*3/uL (ref 0.0–0.7)
Eosinophils Relative: 2.2 % (ref 0.0–5.0)
HCT: 39.3 % (ref 36.0–46.0)
Hemoglobin: 13.6 g/dL (ref 12.0–15.0)
Lymphocytes Relative: 22.1 % (ref 12.0–46.0)
Lymphs Abs: 1.5 10*3/uL (ref 0.7–4.0)
MCHC: 34.6 g/dL (ref 30.0–36.0)
MCV: 91.6 fl (ref 78.0–100.0)
Monocytes Absolute: 0.7 10*3/uL (ref 0.1–1.0)
Monocytes Relative: 10 % (ref 3.0–12.0)
Neutro Abs: 4.4 10*3/uL (ref 1.4–7.7)
Neutrophils Relative %: 65 % (ref 43.0–77.0)
Platelets: 233 10*3/uL (ref 150.0–400.0)
RBC: 4.3 Mil/uL (ref 3.87–5.11)
RDW: 12.9 % (ref 11.5–15.5)
WBC: 6.8 10*3/uL (ref 4.0–10.5)

## 2022-05-03 LAB — LIPID PANEL
Cholesterol: 158 mg/dL (ref 0–200)
HDL: 63 mg/dL (ref >39.00)
LDL Cholesterol: 76 mg/dL (ref 0–99)
NonHDL: 95.18
Total CHOL/HDL Ratio: 3
Triglycerides: 96 mg/dL (ref 0.0–149.0)
VLDL: 19.2 mg/dL (ref 0.0–40.0)

## 2022-05-03 LAB — VITAMIN D 25 HYDROXY (VIT D DEFICIENCY, FRACTURES): VITD: 52.71 ng/mL (ref 30.00–100.00)

## 2022-05-03 LAB — TSH: TSH: 2.96 u[IU]/mL (ref 0.35–5.50)

## 2022-05-09 ENCOUNTER — Ambulatory Visit: Payer: Medicare HMO | Admitting: Family Medicine

## 2022-05-09 ENCOUNTER — Ambulatory Visit (INDEPENDENT_AMBULATORY_CARE_PROVIDER_SITE_OTHER): Payer: Medicare HMO | Admitting: Family Medicine

## 2022-05-09 ENCOUNTER — Encounter: Payer: Self-pay | Admitting: Family Medicine

## 2022-05-09 VITALS — BP 136/74 | HR 80 | Temp 97.5°F | Ht 61.75 in | Wt 170.0 lb

## 2022-05-09 DIAGNOSIS — I10 Essential (primary) hypertension: Secondary | ICD-10-CM

## 2022-05-09 DIAGNOSIS — K219 Gastro-esophageal reflux disease without esophagitis: Secondary | ICD-10-CM | POA: Diagnosis not present

## 2022-05-09 DIAGNOSIS — Z0000 Encounter for general adult medical examination without abnormal findings: Secondary | ICD-10-CM

## 2022-05-09 DIAGNOSIS — G4733 Obstructive sleep apnea (adult) (pediatric): Secondary | ICD-10-CM | POA: Diagnosis not present

## 2022-05-09 DIAGNOSIS — E7800 Pure hypercholesterolemia, unspecified: Secondary | ICD-10-CM | POA: Diagnosis not present

## 2022-05-09 DIAGNOSIS — M8580 Other specified disorders of bone density and structure, unspecified site: Secondary | ICD-10-CM | POA: Diagnosis not present

## 2022-05-09 DIAGNOSIS — R69 Illness, unspecified: Secondary | ICD-10-CM | POA: Diagnosis not present

## 2022-05-09 DIAGNOSIS — E871 Hypo-osmolality and hyponatremia: Secondary | ICD-10-CM | POA: Diagnosis not present

## 2022-05-09 DIAGNOSIS — Z1231 Encounter for screening mammogram for malignant neoplasm of breast: Secondary | ICD-10-CM | POA: Diagnosis not present

## 2022-05-09 DIAGNOSIS — R7303 Prediabetes: Secondary | ICD-10-CM

## 2022-05-09 DIAGNOSIS — F419 Anxiety disorder, unspecified: Secondary | ICD-10-CM

## 2022-05-09 DIAGNOSIS — R1314 Dysphagia, pharyngoesophageal phase: Secondary | ICD-10-CM

## 2022-05-09 DIAGNOSIS — E2839 Other primary ovarian failure: Secondary | ICD-10-CM | POA: Diagnosis not present

## 2022-05-09 DIAGNOSIS — F4321 Adjustment disorder with depressed mood: Secondary | ICD-10-CM

## 2022-05-09 DIAGNOSIS — E559 Vitamin D deficiency, unspecified: Secondary | ICD-10-CM | POA: Diagnosis not present

## 2022-05-09 NOTE — Assessment & Plan Note (Signed)
Disc goals for lipids and reasons to control them Rev last labs with pt Rev low sat fat diet in detail Well controlled with med and diet  Simvastatin 20 mg daily -tolerates well

## 2022-05-09 NOTE — Assessment & Plan Note (Signed)
Imp with addn of pepcid  Had nl EGD as well

## 2022-05-09 NOTE — Assessment & Plan Note (Signed)
bp in fair control at this time  BP Readings from Last 1 Encounters:  05/09/22 136/74   No changes needed Most recent labs reviewed  Disc lifstyle change with low sodium diet and exercise  Lisinopril 20 mg daily  benicar 40 mg daily

## 2022-05-09 NOTE — Assessment & Plan Note (Signed)
Na of 130  Disc cutting water intake a bit  She is on ssri This is chronic/watching

## 2022-05-09 NOTE — Patient Instructions (Addendum)
Think about options of indoor exercise when the weather does not permit walking   Goal is 30 or more minutes per day   Drink a little less water for your sodium level   For diabetes prevention and weight  Get rid of the sweet tea Cut back on sweets Try to get most of your carbohydrates from produce (with the exception of white potatoes)  Eat less bread/pasta/rice/snack foods/cereals/sweets and other items from the middle of the grocery store (processed carbs)   Call and schedule your mammogram and dexa at [facility]   Please call the location of your choice from the menu below to schedule your Mammogram and/or Bone Density appointment.    [facility]   [facility]                      Phone:  [phone] [address]                               [place], [place] [address]                                                             Services: Traditional and 3D Mammogram, Bone Density   [facility]                 Phone: [phone] [address]                                                       [place], [place] [address]    Service: Bone Density ONLY   *this site does NOT perform mammograms  [facility]                        Phone:  [phone] [address]                                  [place], [place] [address]                                            Services:  3D Mammogram and Bone Density    [facility]   Phone:  [phone]   [address]                                                                            [place], [place] [address]  Services: 3D Mammogram and Bone Density  [facility]  Phone:  [phone]   [address] Room [place], [place] [address]                                               Services:  3D Mammogram and Bone Density

## 2022-05-09 NOTE — Assessment & Plan Note (Signed)
Lab Results  Component Value Date   HGBA1C 5.7 05/03/2022   Plans to cut sweets and eliminate sweet tea disc imp of low glycemic diet and wt loss to prevent DM2

## 2022-05-09 NOTE — Assessment & Plan Note (Signed)
Much improved

## 2022-05-09 NOTE — Progress Notes (Signed)
Subjective:    Patient ID: Adriana Marks, female    DOB: 1945/09/08, 76 y.o.   MRN: [id number]  HPI Here for health maintenance exam and to review chronic medical problems   Wt Readings from Last 3 Encounters:  05/09/22 170 lb (77.1 kg)  12/17/21 163 lb 4 oz (74 kg)  10/05/21 160 lb 12.8 oz (72.9 kg)   31.35 kg/m  Less exercise in summer- does a little in the house  Walks when able    Immunization History  Administered Date(s) Administered   Fluad Quad(high Dose 65+) 04/30/2022   Influenza, High Dose Seasonal PF 05/02/2017, 05/06/2018, 06/07/2020, 06/17/2021   Influenza,inj,Quad PF,6+ Mos 06/04/2019   Influenza-Unspecified 05/18/2013, 05/11/2016   PFIZER(Purple Top)SARS-COV-2 Vaccination 10/18/2019, 11/09/2019, 06/18/2020   PPD Test 02/02/2013, 04/24/2016, 12/26/2020   Pneumococcal Conjugate-13 09/09/2014   Pneumococcal Polysaccharide-23 11/14/2017   Pneumococcal-Unspecified 08/27/2012   Tdap 01/01/2011   Zoster Recombinat (Shingrix) 11/20/2021, 03/12/2022   Zoster, Live 08/13/2012   Health Maintenance Due  Topic Date Due   COVID-19 Vaccine (4 - Pfizer series) 08/13/2020   MAMMOGRAM  05/01/2022   Mammogram 04/2021 Sister had breast cancer  Self breast exam: no lumps   Had her flu shot  Planning cataract surgery in January   Will have new grand children (twins)  in the winter and will do some day care   Colonoscopy 01/2021  Dexa  12/2016  normal  (had osteopenia prior)  Falls: none Fractures:none  Supplements : vitamin D D level is 52  Exercise : walking   Mood/anxiety : much better /doing ok  Buspar - bid  Zoloft 100 mg in am -really helps  HTN bp is stable today  No cp or palpitations or headaches or edema  No side effects to medicines  BP Readings from Last 3 Encounters:  05/09/22 136/74  12/17/21 (!) 158/88  10/05/21 140/82     Lisinopril 20 mg dialy  Benicar 40 mg daily   Lab Results  Component Value Date   CREATININE 0.77 05/03/2022    BUN 11 05/03/2022   NA 130 (L) 05/03/2022   K 4.4 05/03/2022   CL 96 05/03/2022   CO2 28 05/03/2022    GERD Omeprazole 20 mg bid  Pepcid- at bedtime/helped a lot  Had EGD with GI - it was fine / no longer has symptoms    Hyperlipidemia Lab Results  Component Value Date   CHOL 158 05/03/2022   CHOL 135 04/10/2021   CHOL 154 04/05/2020   Lab Results  Component Value Date   HDL 63.00 05/03/2022   HDL 55.60 04/10/2021   HDL 60.10 04/05/2020   Lab Results  Component Value Date   LDLCALC 76 05/03/2022   LDLCALC 64 04/10/2021   LDLCALC 80 04/05/2020   Lab Results  Component Value Date   TRIG 96.0 05/03/2022   TRIG 79.0 04/10/2021   TRIG 72.0 04/05/2020   Lab Results  Component Value Date   CHOLHDL 3 05/03/2022   CHOLHDL 2 04/10/2021   CHOLHDL 3 04/05/2020   No results found for: "LDLDIRECT" Simvastatin 20 mg daily   Diet is fair  Gained some weight  Loves sweets-that is her struggle   Prediabetes Lab Results  Component Value Date   HGBA1C 5.7 05/03/2022   Stable  Will stop having sweets in the house  Loves sugar in tea   Lab Results  Component Value Date   WBC 6.8 05/03/2022   HGB 13.6 05/03/2022   HCT 39.3 05/03/2022  MCV 91.6 05/03/2022   PLT 233.0 05/03/2022   Lab Results  Component Value Date   ALT 15 05/03/2022   AST 19 05/03/2022   ALKPHOS 57 05/03/2022   BILITOT 0.4 05/03/2022   Lab Results  Component Value Date   TSH 2.96 05/03/2022    Patient Active Problem List   Diagnosis Date Noted   OSA (obstructive sleep apnea) 10/05/2021   Chest discomfort 08/01/2021   Sleep apnea 01/29/2021   Chronic headaches 12/07/2020   Hyponatremia 04/12/2020   Screening for colon cancer 10/29/2019   Stress reaction 08/31/2019   Screening mammogram for breast cancer 12/08/2017   Estrogen deficiency 12/04/2016   Grief reaction 09/09/2014   Adjustment disorder with depressed mood 09/09/2014   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   Seafood allergy  04/27/2013   Prediabetes 08/12/2012   Other screening mammogram 03/22/2011   Hyperlipidemia 01/01/2011   Hypertension 01/01/2011   GERD (gastroesophageal reflux disease) 01/01/2011   Routine general medical examination at a health care facility 01/01/2011   Vitamin D deficiency disease 01/01/2011   Osteopenia 01/01/2011   Anxiety 01/01/2011   Allergic rhinitis 01/01/2011   Rosacea 01/01/2011   Disorder of bone and cartilage 01/01/2011   Past Medical History:  Diagnosis Date   Allergy    Bronchitis    Colon polyps    Depression    GERD (gastroesophageal reflux disease)    Heart murmur    History of chicken pox    Hx: UTI (urinary tract infection)    Hyperlipidemia    Hypertension    Migraines    PONV (postoperative nausea and vomiting)    Rosacea    Sleep apnea    uses cpap every night    Past Surgical History:  Procedure Laterality Date   COLONOSCOPY WITH PROPOFOL N/A 01/25/2021   Procedure: COLONOSCOPY WITH PROPOFOL;  Surgeon: Toney Reil, MD;  [facility]: [facility];  Service: Gastroenterology;  Laterality: N/A;   DNC     FOOT SURGERY     Social History   Tobacco Use   Smoking status: Former    Packs/day: 0.50    Years: 4.00    Total pack years: 2.00    Types: Cigarettes    Quit date: 1980    Years since quitting: 43.7   Smokeless tobacco: Never  Vaping Use   Vaping Use: Never used  Substance Use Topics   Alcohol use: Yes    Alcohol/week: 0.0 standard drinks of alcohol    Comment: wine occasional   Drug use: No   Family History  Problem Relation Age of Onset   Heart disease Father    Hypertension Father    Diabetes Other    Heart disease Mother    Stroke Paternal Grandmother    Heart disease Paternal Grandfather    Hypertension Paternal Grandfather    Breast cancer Paternal Aunt    Breast cancer Sister    Cancer Sister    Bladder Cancer Neg Hx    Kidney cancer Neg Hx    Allergies  Allergen Reactions   Shellfish Allergy Anaphylaxis     Sob/ swelling   Amlodipine     headache   Current Outpatient Medications on File Prior to Visit  Medication Sig Dispense Refill   aspirin 81 MG tablet Take 81 mg by mouth daily.     busPIRone (BUSPAR) 15 MG tablet Take 1 tablet (15 mg total) by mouth 2 (two) times daily. 180 tablet 3   famotidine (PEPCID) 20 MG tablet  Take 1 tablet (20 mg total) by mouth at bedtime. 90 tablet 3   loratadine (CLARITIN) 10 MG tablet Take 10 mg by mouth daily.     metroNIDAZOLE (METROGEL) 1 % gel Apply topically 2 (two) times daily. 180 g 3   olmesartan (BENICAR) 40 MG tablet Take 1 tablet (40 mg total) by mouth daily. 90 tablet 3   omeprazole (PRILOSEC) 20 MG capsule TAKE 1 CAPSULE BY MOUTH TWICE A DAY 180 capsule 2   sertraline (ZOLOFT) 50 MG tablet Take 2 tablets (100 mg total) by mouth daily. 180 tablet 3   simvastatin (ZOCOR) 20 MG tablet TAKE 1 TABLET BY MOUTH DAILY AT 6 PM. 90 tablet 1   No current facility-administered medications on file prior to visit.      Review of Systems  Constitutional:  Negative for activity change, appetite change, fatigue, fever and unexpected weight change.  HENT:  Negative for congestion, ear pain, rhinorrhea, sinus pressure and sore throat.   Eyes:  Negative for pain, redness and visual disturbance.  Respiratory:  Negative for cough, shortness of breath and wheezing.   Cardiovascular:  Negative for chest pain and palpitations.  Gastrointestinal:  Negative for abdominal pain, blood in stool, constipation and diarrhea.  Endocrine: Negative for polydipsia and polyuria.  Genitourinary:  Negative for dysuria, frequency and urgency.  Musculoskeletal:  Positive for arthralgias. Negative for back pain and myalgias.  Skin:  Negative for pallor and rash.  Allergic/Immunologic: Negative for environmental allergies.  Neurological:  Negative for dizziness, syncope and headaches.  Hematological:  Negative for adenopathy. Does not bruise/bleed easily.  Psychiatric/Behavioral:   Negative for decreased concentration and dysphoric mood. The patient is nervous/anxious.        Objective:   Physical Exam Constitutional:      General: She is not in acute distress.    Appearance: Normal appearance. She is well-developed. She is obese. She is not ill-appearing or diaphoretic.  HENT:     Head: Normocephalic and atraumatic.     Right Ear: Tympanic membrane, ear canal and external ear normal.     Left Ear: Tympanic membrane, ear canal and external ear normal.     Nose: Nose normal. No congestion.     Mouth/Throat:     Mouth: Mucous membranes are moist.     Pharynx: Oropharynx is clear. No posterior oropharyngeal erythema.  Eyes:     General: No scleral icterus.    Extraocular Movements: Extraocular movements intact.     Conjunctiva/sclera: Conjunctivae normal.     Pupils: Pupils are equal, round, and reactive to light.  Neck:     Thyroid: No thyromegaly.     Vascular: No carotid bruit or JVD.  Cardiovascular:     Rate and Rhythm: Normal rate and regular rhythm.     Pulses: Normal pulses.     Heart sounds: Normal heart sounds.     No gallop.  Pulmonary:     Effort: Pulmonary effort is normal. No respiratory distress.     Breath sounds: Normal breath sounds. No wheezing.     Comments: Good air exch Chest:     Chest wall: No tenderness.  Abdominal:     General: Bowel sounds are normal. There is no distension or abdominal bruit.     Palpations: Abdomen is soft. There is no mass.     Tenderness: There is no abdominal tenderness.     Hernia: No hernia is present.  Genitourinary:    Comments: Breast exam: No mass, nodules, thickening,  tenderness, bulging, retraction, inflamation, nipple discharge or skin changes noted.  No axillary or clavicular LA.     Musculoskeletal:        General: No tenderness. Normal range of motion.     Cervical back: Normal range of motion and neck supple. No rigidity. No muscular tenderness.     Right lower leg: No edema.     Left  lower leg: No edema.     Comments: No kyphosis   Lymphadenopathy:     Cervical: No cervical adenopathy.  Skin:    General: Skin is warm and dry.     Coloration: Skin is not pale.     Findings: No erythema or rash.     Comments: Many SKs on trunk/back  Fair Solar lentigines diffusely   Neurological:     Mental Status: She is alert. Mental status is at baseline.     Cranial Nerves: No cranial nerve deficit.     Motor: No abnormal muscle tone.     Coordination: Coordination normal.     Gait: Gait normal.     Deep Tendon Reflexes: Reflexes are normal and symmetric. Reflexes normal.  Psychiatric:        Mood and Affect: Mood normal.        Cognition and Memory: Cognition and memory normal.           Assessment & Plan:   Problem List Items Addressed This Visit       Cardiovascular and Mediastinum   Hypertension    bp in fair control at this time  BP Readings from Last 1 Encounters:  05/09/22 136/74  No changes needed Most recent labs reviewed  Disc lifstyle change with low sodium diet and exercise  Lisinopril 20 mg daily  benicar 40 mg daily          Respiratory   OSA (obstructive sleep apnea)    Doing well with cpap  Has check planned in nov        Digestive   GERD (gastroesophageal reflux disease)    Much better with addn of pepcid 20 mg at night Watching diet  Continues omeprazole 20 mg daily  Saw GI- Dr Norma Fredrickson EGD was unremarkable       RESOLVED: Pharyngoesophageal dysphagia    Imp with addn of pepcid  Had nl EGD as well           Musculoskeletal and Integument   Osteopenia    dexa last 2018  Ordered and pt will schedule No falls or fx Therapeutic D level  Disc need for calcium/ vitamin D/ wt bearing exercise and bone density test every 2 y to monitor Disc safety/ fracture risk in detail          Other   Adjustment disorder with depressed mood    Mood is good with zoloft 100 mg daily and buspar  Reviewed stressors/ coping  techniques/symptoms/ support sources/ tx options and side effects in detail today Watching sodium level with ssris       Anxiety    Much improved       Estrogen deficiency   Relevant Orders   DG Bone Density   Hyperlipidemia    Disc goals for lipids and reasons to control them Rev last labs with pt Rev low sat fat diet in detail Well controlled with med and diet  Simvastatin 20 mg daily -tolerates well      Hyponatremia    Na of 130  Disc cutting water intake a bit  She is on ssri This is chronic/watching       Prediabetes    Lab Results  Component Value Date   HGBA1C 5.7 05/03/2022  Plans to cut sweets and eliminate sweet tea disc imp of low glycemic diet and wt loss to prevent DM2       Routine general medical examination at a health care facility - Primary    Reviewed health habits including diet and exercise and skin cancer prevention Reviewed appropriate screening tests for age  Also reviewed health mt list, fam hx and immunization status , as well as social and family history   See HPI Mammogram due/ordered-pt to schedule dexa ordered -pt to schedule No falls or fx and D level is tx  Enc more exercise  Plans to get Tdap in pharmacy (has new babies in family coming) Colonoscopy utd 01/2021  Mood/anxiety is improved  Encouraged good health habits       Screening mammogram for breast cancer   Relevant Orders   MM 3D SCREEN BREAST BILATERAL   Vitamin D deficiency disease    Level of 52 Vitamin D level is therapeutic with current supplementation Disc importance of this to bone and overall health

## 2022-05-09 NOTE — Assessment & Plan Note (Signed)
Much better with addn of pepcid 20 mg at night Watching diet  Continues omeprazole 20 mg daily  Saw GI- Dr Norma Fredrickson EGD was unremarkable

## 2022-05-09 NOTE — Assessment & Plan Note (Signed)
Level of 52 Vitamin D level is therapeutic with current supplementation Disc importance of this to bone and overall health

## 2022-05-09 NOTE — Assessment & Plan Note (Signed)
dexa last 2018  Ordered and pt will schedule No falls or fx Therapeutic D level  Disc need for calcium/ vitamin D/ wt bearing exercise and bone density test every 2 y to monitor Disc safety/ fracture risk in detail

## 2022-05-09 NOTE — Assessment & Plan Note (Signed)
Doing well with cpap  Has check planned in nov

## 2022-05-09 NOTE — Assessment & Plan Note (Signed)
Mood is good with zoloft 100 mg daily and buspar  Reviewed stressors/ coping techniques/symptoms/ support sources/ tx options and side effects in detail today Watching sodium level with ssris

## 2022-05-09 NOTE — Assessment & Plan Note (Signed)
Reviewed health habits including diet and exercise and skin cancer prevention Reviewed appropriate screening tests for age  Also reviewed health mt list, fam hx and immunization status , as well as social and family history   See HPI Mammogram due/ordered-pt to schedule dexa ordered -pt to schedule No falls or fx and D level is tx  Enc more exercise  Plans to get Tdap in pharmacy (has new babies in family coming) Colonoscopy utd 01/2021  Mood/anxiety is improved  Encouraged good health habits

## 2022-05-15 DIAGNOSIS — G4733 Obstructive sleep apnea (adult) (pediatric): Secondary | ICD-10-CM | POA: Diagnosis not present

## 2022-05-22 ENCOUNTER — Ambulatory Visit: Payer: Medicare HMO

## 2022-06-09 ENCOUNTER — Other Ambulatory Visit: Payer: Self-pay | Admitting: Family Medicine

## 2022-06-14 DIAGNOSIS — G4733 Obstructive sleep apnea (adult) (pediatric): Secondary | ICD-10-CM | POA: Diagnosis not present

## 2022-06-25 ENCOUNTER — Ambulatory Visit
Admission: RE | Admit: 2022-06-25 | Discharge: 2022-06-25 | Disposition: A | Discharge status: Home / Self Care | Payer: Medicare HMO | Source: Ambulatory Visit | Attending: Family Medicine | Admitting: Family Medicine

## 2022-06-25 DIAGNOSIS — E2839 Other primary ovarian failure: Secondary | ICD-10-CM | POA: Diagnosis not present

## 2022-06-25 DIAGNOSIS — Z1231 Encounter for screening mammogram for malignant neoplasm of breast: Secondary | ICD-10-CM | POA: Insufficient documentation

## 2022-06-25 DIAGNOSIS — M810 Age-related osteoporosis without current pathological fracture: Secondary | ICD-10-CM | POA: Diagnosis not present

## 2022-06-28 ENCOUNTER — Other Ambulatory Visit: Payer: Self-pay | Admitting: Family Medicine

## 2022-07-02 ENCOUNTER — Telehealth: Payer: Self-pay

## 2022-07-02 NOTE — Telephone Encounter (Signed)
Lm for patient to request that she bring SD card to 07/03/2022 visit.

## 2022-07-02 NOTE — Telephone Encounter (Signed)
Noted  

## 2022-07-03 ENCOUNTER — Ambulatory Visit: Payer: Medicare HMO | Admitting: Internal Medicine

## 2022-07-03 ENCOUNTER — Encounter: Payer: Self-pay | Admitting: Internal Medicine

## 2022-07-03 VITALS — BP 128/80 | HR 64 | Temp 98.0°F | Ht 62.0 in | Wt 173.0 lb

## 2022-07-03 DIAGNOSIS — G4733 Obstructive sleep apnea (adult) (pediatric): Secondary | ICD-10-CM | POA: Diagnosis not present

## 2022-07-03 NOTE — Patient Instructions (Signed)
Continue auto CPAP for sleep apnea Excellent job A+!!!

## 2022-07-03 NOTE — Progress Notes (Signed)
Name: Adriana Marks MRN: [id number] DOB: 12-23-1945     CONSULTATION DATE: 07/03/2022  REFERRING MD : Milinda Antis  CHIEF COMPLAINT:  Follow up OSA  TEST/EVENTS :  Home sleep study June 24, 2021 showed moderate sleep apnea with AHI at 19.4/hour and SPO2 low at 82%  HISTORY OF PRESENT ILLNESS: Patient returns for a follow-up visit.   home sleep study that was completed on June 24, 2021 that showed moderate sleep apnea with AHI at 19.4/hour.  And SPO2 low at 82%.    She does feel that she has benefited in it with decreased daytime sleepiness and more improved sleep regimen.  CPAP download shows excellent compliance with daily average usage at 8 hours.  Patient is on AutoSet 5 to 12 cm H2O.  AHI is 5.9/hour.  Minimum leaks.     Discussed sleep data and reviewed with patient.  Encouraged proper weight management.     PAST MEDICAL HISTORY :   has a past medical history of Allergy, Bronchitis, Colon polyps, Depression, GERD (gastroesophageal reflux disease), Heart murmur, History of chicken pox, UTI (urinary tract infection), Hyperlipidemia, Hypertension, Migraines, PONV (postoperative nausea and vomiting), Rosacea, and Sleep apnea.  has a past surgical history that includes Foot surgery; DNC; and Colonoscopy with propofol (N/A, 01/25/2021). Prior to Admission medications   Medication Sig Start Date End Date Taking? Authorizing Provider  aspirin 81 MG tablet Take 81 mg by mouth daily.    [provider]  benzonatate (TESSALON PERLES) 100 MG capsule Take 1 capsule (100 mg total) by mouth 3 (three) times daily as needed. 03/18/21   Jannifer Rodney A, FNP  busPIRone (BUSPAR) 15 MG tablet TAKE 1 TABLET BY MOUTH TWICE A DAY Patient taking differently: 15 mg daily. 04/03/21   [name], [name], MD  EPINEPHrine (EPI-PEN) 0.3 mg/0.3 mL SOAJ injection Inject 0.3 mLs (0.3 mg total) into the muscle once. For allergic reaction 04/27/13   [name], [name], MD  lisinopril (ZESTRIL) 20 MG tablet Take 1  tablet (20 mg total) by mouth daily. 03/06/21   [name], [name], MD  loratadine (CLARITIN) 10 MG tablet Take 10 mg by mouth daily.    [provider]  metroNIDAZOLE (METROGEL) 1 % gel Apply topically 2 (two) times daily. 08/31/19   [name], [name], MD  omeprazole (PRILOSEC) 20 MG capsule TAKE 1 CAPSULE BY MOUTH TWICE A DAY 04/02/21   [name], [place] A, MD  sertraline (ZOLOFT) 50 MG tablet TAKE 1 TABLET BY MOUTH EVERY DAY 04/18/21   [name], [name], MD  simvastatin (ZOCOR) 20 MG tablet TAKE 1 TABLET (20 MG TOTAL) BY MOUTH DAILY AT 6 PM. 04/02/21   [name], [name], MD   Allergies  Allergen Reactions   Shellfish Allergy Anaphylaxis    Sob/ swelling   Amlodipine     headache    FAMILY HISTORY:  family history includes Breast cancer in her paternal aunt and sister; Cancer in her sister; Diabetes in an other family member; Heart disease in her father, mother, and paternal grandfather; Hypertension in her father and paternal grandfather; Stroke in her paternal grandmother. SOCIAL HISTORY:  reports that she quit smoking about 43 years ago. Her smoking use included cigarettes. She has a 2.00 pack-year smoking history. She has never used smokeless tobacco. She reports current alcohol use. She reports that she does not use drugs.   BP 128/80 (BP Location: Left Arm, Cuff Size: Normal)   Pulse 64   Temp 98 F (36.7 C) (Temporal)  Ht 5\' 2"  (1.575 m)   Wt 173 lb (78.5 kg)   LMP 08/26/1997   SpO2 95%   BMI 31.64 kg/m      Review of Systems: Gen:  Denies  fever, sweats, chills weight loss  HEENT: Denies blurred vision, double vision, ear pain, eye pain, hearing loss, nose bleeds, sore throat Cardiac:  No dizziness, chest pain or heaviness, chest tightness,edema, No JVD Resp:   No cough, -sputum production, -shortness of breath,-wheezing, -hemoptysis,  Other:  All other systems negative    Physical Examination:   General Appearance: No distress  EYES PERRLA, EOM intact.   NECK Supple, No  JVD Pulmonary: normal breath sounds, No wheezing.  CardiovascularNormal S1,S2.  No m/r/g.   Abdomen: Benign, Soft, non-tender. ALL OTHER ROS ARE NEGATIVE       ASSESSMENT AND PLAN  76 year old pleasant female seen today for assessment for underlying excessive daytime sleepiness with a previous diagnosis of obstructive sleep apnea  MODERATE OSA EXCELLENT COMPLIANCE Report reviewed in detail Auto CPAP 5-15 AHI reduced to 5.9 Previous AHI 19     CURRENT MEDICATIONS REVIEWED AT LENGTH WITH PATIENT TODAY   Patient  satisfied with Plan of action and management. All questions answered  Follow up in 1 years  Total Time Spent  25 mins   Elner Seifert Patricia Pesa, M.D.  [facility]  Medical Director [place] Director [facility] Cardio-Pulmonary Department

## 2022-07-05 ENCOUNTER — Encounter: Payer: Self-pay | Admitting: Family Medicine

## 2022-07-05 ENCOUNTER — Ambulatory Visit (INDEPENDENT_AMBULATORY_CARE_PROVIDER_SITE_OTHER): Payer: Medicare HMO | Admitting: Family Medicine

## 2022-07-05 VITALS — BP 138/80 | HR 72 | Temp 97.7°F | Ht 61.75 in | Wt 174.1 lb

## 2022-07-05 DIAGNOSIS — R202 Paresthesia of skin: Secondary | ICD-10-CM

## 2022-07-05 DIAGNOSIS — M25522 Pain in left elbow: Secondary | ICD-10-CM | POA: Insufficient documentation

## 2022-07-05 DIAGNOSIS — R200 Anesthesia of skin: Secondary | ICD-10-CM

## 2022-07-05 DIAGNOSIS — M810 Age-related osteoporosis without current pathological fracture: Secondary | ICD-10-CM | POA: Diagnosis not present

## 2022-07-05 MED ORDER — ALENDRONATE SODIUM 70 MG PO TABS: 70.0000 mg | ORAL_TABLET | ORAL | 3 refills | Status: DC

## 2022-07-05 NOTE — Patient Instructions (Addendum)
Vitamin D is most important   If you tolerate calcium take 1200 mg daily split  Or/and  get a good calcium in diet   Continue walking  Please add some strength training  Weights are good  Also exercise bands  Gym is great - try a silver sneakers program  Chair yoga is great   Be very careful not to fall Minimize climbing if you can   Let's try a 5 year course of alendronate for your bones If any side effects let us know  We will re check a dexa in 2 years   Sleep with your wrist splints and see if that helps with carpal tunnel  Try some voltaren gel on your elbow Ice helps also

## 2022-07-05 NOTE — Assessment & Plan Note (Signed)
Tends to occur in am  Possible carpal tunnel/ mild  Neg tinel/phalen tests today   Consider wrist splints and night and f/u if not imp

## 2022-07-05 NOTE — Assessment & Plan Note (Signed)
Suspect med epicondylitis based on exam  Recommend ice prn Voltaren gel  Avoid reped tasks (for her use of phone)  Consider foream band  F/u if not imp

## 2022-07-05 NOTE — Progress Notes (Signed)
Subjective:    Patient ID: Adriana Marks, female    DOB: [birth year], 76 y.o.   MRN: [id number]  HPI Pt presents for f/u of dexa /osteoporosis  Also L elbow pain  Also poss carpal tunnel    Wt Readings from Last 3 Encounters:  07/05/22 174 lb 2 oz (79 kg)  07/03/22 173 lb (78.5 kg)  05/09/22 170 lb (77.1 kg)   32.11 kg/m  Dexa 05/2022  DENSITOMETRY RESULTS: Site         Region     Measured Date Measured Age WHO Classification Young Adult T-score BMD         %Change vs. Previous Significant Change (*) DualFemur Neck Left 06/25/[age] 74.5 Oste[age] -1.2 0.874 g/cm2 -4.9% - DualFemur Neck Left 01/08/2017 69.0 Normal -0.9 0.919 g/cm2 - -   DualFemur Total Mean 06/25/2022 74.5 Normal -1.0 0.888 g/cm2 -2.5% Yes DualFemur Total Mean 01/08/2017 69.0 Normal -0.8 0.911 g/cm2 - -   Left Forearm Radius 33% 06/25/2022 74.5 Osteoporosis -2.5 0.658 g/cm2 - -   L forearm is down into the OP range now    Family history - mother had OP and kyphosis   She is on ppi and H2 blocker    Taking 5000 iu vit D3 daily   Exercise-walks 30 minutes per day  Has some weights at home   Her insurance will not cover prolia   No dental problems She goes twice yearly   No falls Is very careful about this   Thinks she has carpal tunnel Stinging in am   Patient Active Problem List   Diagnosis Date Noted   Left elbow pain 07/05/2022   Numbness and tingling in both hands 07/05/2022   OSA (obstructive sleep apnea) 10/05/2021   Chest discomfort 08/01/2021   Sleep apnea 01/29/2021   Chronic headaches 12/07/2020   Hyponatremia 04/12/2020   Screening for colon cancer 10/29/2019   Stress reaction 08/31/2019   Screening mammogram for breast cancer 12/08/2017   Estrogen deficiency 12/04/2016   Grief reaction 09/09/2014   Adjustment disorder with depressed mood 09/09/2014   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   Prediabetes 08/12/2012   Other screening  mammogram 03/22/2011   Hyperlipidemia 01/01/2011   Hypertension 01/01/2011   GERD (gastroesophageal reflux disease) 01/01/2011   Routine general medical examination at a health care facility 01/01/2011   Vitamin D deficiency disease 01/01/2011   Osteoporosis 01/01/2011   Anxiety 01/01/2011   Allergic rhinitis 01/01/2011   Rosacea 01/01/2011   Disorder of bone and cartilage 01/01/2011   Past Medical History:  Diagnosis Date   Allergy    Bronchitis    Colon polyps    Depression    GERD (gastroesophageal reflux disease)    Heart murmur    History of chicken pox    Hx: UTI (urinary tract infection)    Hyperlipidemia    Hypertension    Migraines    PONV (postoperative nausea and vomiting)    Rosacea    Sleep apnea    uses cpap every night    Past Surgical History:  Procedure Laterality Date   COLONOSCOPY WITH PROPOFOL N/A 01/25/2021   Procedure: COLONOSCOPY WITH PROPOFOL;  Surgeon: Toney Reil, MD;  Location: [facility]COPY;  Service: Gastroenterology;  Laterality: N/A;   DNC     FOOT SURGERY     Social History   Tobacco Use   Smoking status: Former    Packs/day: 0.50    Years: 4.00  Total pack years: 2.00    Types: Cigarettes    Quit date: 1980    Years since quitting: 43.8   Smokeless tobacco: Never  Vaping Use   Vaping Use: Never used  Substance Use Topics   Alcohol use: Yes    Alcohol/week: 0.0 standard drinks of alcohol    Comment: wine occasional   Drug use: No   Family History  Problem Relation Age of Onset   Heart disease Father    Hypertension Father    Diabetes Other    Heart disease Mother    Stroke Paternal Grandmother    Heart disease Paternal Grandfather    Hypertension Paternal Grandfather    Breast cancer Paternal Aunt    Breast cancer Sister    Cancer Sister    Bladder Cancer Neg Hx    Kidney cancer Neg Hx    Allergies  Allergen Reactions   Shellfish Allergy Anaphylaxis    Sob/ swelling   Amlodipine     headache    Current Outpatient Medications on File Prior to Visit  Medication Sig Dispense Refill   aspirin 81 MG tablet Take 81 mg by mouth daily.     busPIRone (BUSPAR) 15 MG tablet Take 1 tablet (15 mg total) by mouth 2 (two) times daily. 180 tablet 3   famotidine (PEPCID) 20 MG tablet Take 1 tablet (20 mg total) by mouth at bedtime. 90 tablet 3   loratadine (CLARITIN) 10 MG tablet Take 10 mg by mouth daily.     metroNIDAZOLE (METROGEL) 1 % gel Apply topically 2 (two) times daily. 180 g 3   olmesartan (BENICAR) 40 MG tablet Take 1 tablet (40 mg total) by mouth daily. 90 tablet 3   omeprazole (PRILOSEC) 20 MG capsule TAKE 1 CAPSULE BY MOUTH TWICE A DAY 180 capsule 2   sertraline (ZOLOFT) 50 MG tablet Take 2 tablets (100 mg total) by mouth daily. 180 tablet 3   simvastatin (ZOCOR) 20 MG tablet TAKE 1 TABLET BY MOUTH DAILY AT 6 PM. 90 tablet 1   No current facility-administered medications on file prior to visit.    Review of Systems  Constitutional:  Negative for activity change, appetite change, fatigue, fever and unexpected weight change.  HENT:  Negative for congestion, ear pain, rhinorrhea, sinus pressure and sore throat.   Eyes:  Negative for pain, redness and visual disturbance.  Respiratory:  Negative for cough, shortness of breath and wheezing.   Cardiovascular:  Negative for chest pain and palpitations.  Gastrointestinal:  Negative for abdominal pain, blood in stool, constipation and diarrhea.  Endocrine: Negative for polydipsia and polyuria.  Genitourinary:  Negative for dysuria, frequency and urgency.  Musculoskeletal:  Positive for arthralgias. Negative for back pain and myalgias.  Skin:  Negative for pallor and rash.  Allergic/Immunologic: Negative for environmental allergies.  Neurological:  Positive for numbness. Negative for dizziness, tremors, syncope, weakness and headaches.  Hematological:  Negative for adenopathy. Does not bruise/bleed easily.  Psychiatric/Behavioral:   Negative for decreased concentration and dysphoric mood. The patient is not nervous/anxious.        Objective:   Physical Exam Constitutional:      General: She is not in acute distress.    Appearance: Normal appearance. She is well-developed. She is obese. She is not ill-appearing or diaphoretic.  HENT:     Head: Normocephalic and atraumatic.  Eyes:     Conjunctiva/sclera: Conjunctivae normal.     Pupils: Pupils are equal, round, and reactive to light.  Neck:  Thyroid: No thyromegaly.     Vascular: No carotid bruit or JVD.  Cardiovascular:     Rate and Rhythm: Normal rate and regular rhythm.     Heart sounds: Normal heart sounds.     No gallop.  Pulmonary:     Effort: Pulmonary effort is normal. No respiratory distress.     Breath sounds: Normal breath sounds. No wheezing or rales.  Abdominal:     General: There is no distension or abdominal bruit.     Palpations: Abdomen is soft.  Musculoskeletal:     Cervical back: Normal range of motion and neck supple.     Right lower leg: No edema.     Left lower leg: No edema.     Comments: Mild kyphosis   Medium frame   Nl rom wrists and elbows  L medial epicondyle tenderness  Lymphadenopathy:     Cervical: No cervical adenopathy.  Skin:    General: Skin is warm and dry.     Coloration: Skin is not jaundiced or pale.     Findings: No bruising or rash.     Comments: Fair complexion   Neurological:     Mental Status: She is alert.     Coordination: Coordination normal.     Deep Tendon Reflexes: Reflexes are normal and symmetric. Reflexes normal.     Comments: Neg tinel and phalen tests bilaterally  Psychiatric:        Mood and Affect: Mood normal.           Assessment & Plan:   Problem List Items Addressed This Visit       Musculoskeletal and Integument   Osteoporosis - Primary    Dexa 05/2022 - noted OP now in L forearm with TS of -2.5  Osteopenia in hip   No falls or fx Disc fall prev  Disc risk  factors-on ppi and has fam hx Enc to keep walking and add some strength training (she considers silver sneakers also Continue vit D 5000 iu daily  Add ca if tolerated  Disc tx options Will try alendronate weekly  Rev pot side eff in detal Also way to take Will hold if she needs dental surg in the future (not expected)  Will plan to re check dexa in 2 y  Handouts given        Relevant Medications   alendronate (FOSAMAX) 70 MG tablet     Other   Left elbow pain    Suspect med epicondylitis based on exam  Recommend ice prn Voltaren gel  Avoid reped tasks (for her use of phone)  Consider foream band  F/u if not imp      Numbness and tingling in both hands    Tends to occur in am  Possible carpal tunnel/ mild  Neg tinel/phalen tests today   Consider wrist splints and night and f/u if not imp

## 2022-07-05 NOTE — Assessment & Plan Note (Signed)
Dexa 05/2022 - noted OP now in L forearm with TS of -2.5  Osteopenia in hip   No falls or fx Disc fall prev  Disc risk factors-on ppi and has fam hx Enc to keep walking and add some strength training (she considers silver sneakers also Continue vit D 5000 iu daily  Add ca if tolerated  Disc tx options Will try alendronate weekly  Rev pot side eff in detal Also way to take Will hold if she needs dental surg in the future (not expected)  Will plan to re check dexa in 2 y  Handouts given

## 2022-07-15 ENCOUNTER — Ambulatory Visit: Payer: Medicare HMO | Admitting: Internal Medicine

## 2022-07-15 DIAGNOSIS — G4733 Obstructive sleep apnea (adult) (pediatric): Secondary | ICD-10-CM | POA: Diagnosis not present

## 2022-08-14 DIAGNOSIS — G4733 Obstructive sleep apnea (adult) (pediatric): Secondary | ICD-10-CM | POA: Diagnosis not present

## 2022-09-11 ENCOUNTER — Encounter: Payer: Self-pay | Admitting: Family Medicine

## 2022-09-14 ENCOUNTER — Other Ambulatory Visit: Payer: Self-pay | Admitting: Family Medicine

## 2022-09-22 DIAGNOSIS — R69 Illness, unspecified: Secondary | ICD-10-CM | POA: Diagnosis not present

## 2022-10-10 ENCOUNTER — Encounter: Payer: Self-pay | Admitting: Family Medicine

## 2022-10-10 NOTE — Telephone Encounter (Signed)
Patient called in and would like to know if Dr Glori Bickers can send in rx for prolia that she needs every 6 months. Her insurance will cover it at 100 dollars every 6 months.

## 2022-10-10 NOTE — Telephone Encounter (Signed)
What pharmacy?  Do I sent to a pharmacy or order it for here for her to get the better price?  Thanks !

## 2022-10-10 NOTE — Telephone Encounter (Signed)
error 

## 2022-10-11 NOTE — Telephone Encounter (Signed)
Per Jasper Riling still needs to go through our authorization dpt, will route message to her before we send in any Rxs in

## 2022-10-18 ENCOUNTER — Telehealth: Payer: Self-pay

## 2022-10-18 NOTE — Telephone Encounter (Signed)
New Prolia patient.  Please start PA for Prolia.  Thanks, Anda Kraft

## 2022-10-21 ENCOUNTER — Other Ambulatory Visit (HOSPITAL_COMMUNITY): Payer: Self-pay

## 2022-10-21 NOTE — Telephone Encounter (Signed)
Pharmacy Patient Advocate Encounter  Insurance verification completed.    The patient is insured through [employer] for: Prolia '60mg'$ .  Pharmacy benefit copay: $100

## 2022-10-21 NOTE — Telephone Encounter (Signed)
Prolia VOB initiated via [email or web address] 

## 2022-10-25 NOTE — Telephone Encounter (Signed)
Katrina from [employer] support, phone # [phone], called.  They received a prior auth for Prolia for this patient, but the name came across as "Essential 6", not the patient's name.  All the other information was correct.  They need to have the prior auth patient's name changed so that they can complete the prior auth, please.  Thanks, Anda Kraft

## 2022-11-19 NOTE — Telephone Encounter (Signed)
Wanted to follow up about approval for this? I seen a few messages but not sure what is accurate.

## 2022-11-20 NOTE — Telephone Encounter (Signed)
Prolia VOB initiated via [email or web address]  Resubmitted due to the first one having the incorrect name.

## 2022-12-05 ENCOUNTER — Other Ambulatory Visit: Payer: Self-pay | Admitting: Family Medicine

## 2022-12-16 NOTE — Telephone Encounter (Signed)
Checking on PA for Prolia.  Please respond to Barnie Mort at [facility] when this has been approved.  Thank you!

## 2022-12-17 NOTE — Telephone Encounter (Signed)
Submitted PA via [employer].  Authorization Number : [id number]

## 2022-12-24 NOTE — Telephone Encounter (Signed)
Patient Advocate Encounter  Prior Authorization for Adriana Marks has been approved.   Effective: 12/17/2322 to 12/17/2023

## 2022-12-25 ENCOUNTER — Telehealth: Payer: Self-pay

## 2022-12-25 ENCOUNTER — Other Ambulatory Visit (HOSPITAL_COMMUNITY): Payer: Self-pay

## 2022-12-25 NOTE — Telephone Encounter (Signed)
Pt ready for scheduling for Prolia on or after : 12/25/22  Out-of-pocket cost due at time of visit: $302  Primary: [employer] Prolia co-insurance: 20% Admin fee co-insurance: 0%  Secondary: --- Prolia co-insurance:  Admin fee co-insurance:   Medical Benefit Details: Date Benefits were checked: 11/20/22 Deductible: NO/ Coinsurance: 20%/ Admin Fee: 0%  Prior Auth: APPROVED PA# [id number]  Expiration Date: 12/17/2023   Pharmacy benefit: Copay $100 If patient wants fill through the pharmacy benefit please send prescription to: [employer], and include estimated need by date in rx notes. Pharmacy will ship medication directly to the office.  Patient NOT eligible for Prolia Copay Card. Copay Card can make patient's cost as little as $25. Link to apply: [email or web address]://[email or web address]  ** This summary of benefits is an estimation of the patient's out-of-pocket cost. Exact cost may very based on individual plan coverage.

## 2022-12-31 ENCOUNTER — Telehealth: Payer: Self-pay | Admitting: Family Medicine

## 2022-12-31 NOTE — Telephone Encounter (Signed)
Before we change anything I want to see her in the office to check bp , etc  Please schedule appt  Thanks

## 2022-12-31 NOTE — Telephone Encounter (Signed)
Pt notified and appt scheduled.

## 2022-12-31 NOTE — Telephone Encounter (Signed)
I think we changed from lisinopril to benicar to better control her bp  What kind of side effects is she having ? (What does her stomach feel like)  Is it worse right when she takes the pill ? Has she tried taking it with food?

## 2022-12-31 NOTE — Telephone Encounter (Signed)
  olmesartan (BENICAR) 40 MG tablet  Stomach very sensitive, could she go back to lisonopril and if so, please write a prescription,  Stomach better without fosamex, but still issues

## 2022-12-31 NOTE — Telephone Encounter (Signed)
Patient would like to set up an appointment for prolia, as long as she can use the pharmacy benefit as listed below:  Pharmacy benefit: Copay $100 If patient wants fill through the pharmacy benefit please send prescription to: [employer], and include estimated need by date in rx notes. Pharmacy will ship medication directly to the office.  Please follow-up with the patient at [phone]

## 2022-12-31 NOTE — Telephone Encounter (Signed)
Pt said she is having "stomach" problems. I asked pt to explain more. Pt said she is having bouts of diarrhea and constipation, gas and bloating. Pt said it was worse when she was on the fosamax but since she d/c that it's better but still an issue. Pt said it's a all day situation not just right after she takes med. Pt said she takes her omeprazole and then 30 min later eats and then 30 min later she takes her benicar and other meds

## 2023-01-03 NOTE — Telephone Encounter (Signed)
Did not see patient portion due.

## 2023-01-06 ENCOUNTER — Telehealth: Payer: Self-pay

## 2023-01-06 ENCOUNTER — Other Ambulatory Visit: Payer: Self-pay

## 2023-01-06 ENCOUNTER — Ambulatory Visit (INDEPENDENT_AMBULATORY_CARE_PROVIDER_SITE_OTHER): Payer: Medicare HMO | Admitting: Family Medicine

## 2023-01-06 VITALS — BP 155/88 | HR 75 | Temp 97.9°F | Ht 61.75 in | Wt 174.4 lb

## 2023-01-06 DIAGNOSIS — M810 Age-related osteoporosis without current pathological fracture: Secondary | ICD-10-CM

## 2023-01-06 DIAGNOSIS — K582 Mixed irritable bowel syndrome: Secondary | ICD-10-CM

## 2023-01-06 DIAGNOSIS — I10 Essential (primary) hypertension: Secondary | ICD-10-CM

## 2023-01-06 DIAGNOSIS — K589 Irritable bowel syndrome without diarrhea: Secondary | ICD-10-CM | POA: Insufficient documentation

## 2023-01-06 DIAGNOSIS — E871 Hypo-osmolality and hyponatremia: Secondary | ICD-10-CM | POA: Diagnosis not present

## 2023-01-06 LAB — BASIC METABOLIC PANEL
BUN: 11 mg/dL (ref 6–23)
CO2: 27 mEq/L (ref 19–32)
Calcium: 9.8 mg/dL (ref 8.4–10.5)
Chloride: 95 mEq/L — ABNORMAL LOW (ref 96–112)
Creatinine, Ser: 0.72 mg/dL (ref 0.40–1.20)
GFR: 81.99 mL/min (ref >60.00)
Glucose, Bld: 90 mg/dL (ref 70–99)
Potassium: 4.4 mEq/L (ref 3.5–5.1)
Sodium: 131 mEq/L — ABNORMAL LOW (ref 135–145)

## 2023-01-06 MED ORDER — DENOSUMAB 60 MG/ML ~~LOC~~ SOSY: 60.0000 mg | PREFILLED_SYRINGE | Freq: Once | SUBCUTANEOUS | 0 refills | Status: AC

## 2023-01-06 NOTE — Assessment & Plan Note (Signed)
This is chronic On/off Some LLQ discomfort if constipated Normal exam Discussed trial of probiotic (has at home) Discussed trial of fiber supplement as well Instructed to alert if not improved Consider GI f/u  Reviewed last colonsocopy 8/22- noted no diverticuli

## 2023-01-06 NOTE — Assessment & Plan Note (Signed)
Last sodium 130- baseline Cannot tolerate diuretics  Does not drink excessive fluids  Bmp today

## 2023-01-06 NOTE — Telephone Encounter (Signed)
Called patient reviewed all following information including appointment, Co pay due at time of visit and if pick up of injection is needed from outside pharmacy.     Out of pocket for patient: $100 from pharmacy   Lab appointment : will  have in office today   Nurse visit:  01/16/23  Lab order placed: No  Prolia has been  []   Ordered  []   Script sent to local pharmacy for patient to bring   [x]   Script sent to Specialty pharmacy   []   Script sent to [facility] to deliver

## 2023-01-06 NOTE — Progress Notes (Unsigned)
   Care Guide Note  01/06/2023 Name: Adriana Marks MRN: [id number] DOB: [birth year]  Referred by: [name], [name], MD Reason for referral : Care Coordination (Outreach to schedule with pharm d )   Adriana Marks is a 77 y.o. year old female who is a primary care patient of [name], [name], MD. [name] was referred to the pharmacist for assistance related to HTN.    An unsuccessful telephone outreach was attempted today to contact the patient who was referred to the pharmacy team for assistance with medication management. Additional attempts will be made to contact the patient.   [name], RMA Care Guide [facility]  [place], [place] [address] Direct Dial: [phone] [email or web address]

## 2023-01-06 NOTE — Assessment & Plan Note (Signed)
BP: (!) 155/88  Bp is higher here than at home  Anxiety plays a role but unsure if well controlled   Continues olmesartan 40 mg daily  Tolerates this   Has baseline low na- cannot have diuretic  In past intol to amlodipine and also metoprolol   Long discussion today re proper way/time and condition to take bp (when relaxed0 Instructed to report back with readings in 1-2 wk Also referred to pharmacy for help with management in light of med intolerances

## 2023-01-06 NOTE — Progress Notes (Signed)
Subjective:    Patient ID: Adriana Marks, female    DOB: [birth year], 77 y.o.   MRN: [id number]  HPI Pt presents for HTN Also osteoporosis- planning for Prolia Some ongoing IBS  Wt Readings from Last 3 Encounters:  01/06/23 174 lb 6 oz (79.1 kg)  07/05/22 174 lb 2 oz (79 kg)  07/03/22 173 lb (78.5 kg)   32.15 kg/m  Vitals:   01/06/23 0916  BP: (!) 156/86  Pulse: 75  Temp: 97.9 F (36.6 C)  SpO2: 98%    Benicar 40 mg daily   Lab Results  Component Value Date   CREATININE 0.77 05/03/2022   BUN 11 05/03/2022   NA 130 (L) 05/03/2022   K 4.4 05/03/2022   CL 96 05/03/2022   CO2 28 05/03/2022     In past hctz causes hyponatremia  Intolerant to amlodipine - caused headache Intol to metoprolol xl -caused headache   At home her bp  Sometimes high in the am - before she takes her medicine   BP Readings from Last 3 Encounters:  01/06/23 (!) 155/88  07/05/22 138/80  07/03/22 128/80    130s./70s when really relaxed  Olmesartan 40 mg daily   Stomach symptoms Sensitive in L side sometimes  Last colonoscopy 2022 and no diverticuli   Eats probiotic food/ yogurt Bought a probiotic - has not started   Goes between diarrhea and constipation    Stopped fosamax - gave her headaches and diarrhea    Last metabolic panel Lab Results  Component Value Date   GLUCOSE 89 05/03/2022   NA 130 (L) 05/03/2022   K 4.4 05/03/2022   CL 96 05/03/2022   CO2 28 05/03/2022   BUN 11 05/03/2022   CREATININE 0.77 05/03/2022   GFRNONAA >60 03/05/2021   CALCIUM 9.7 05/03/2022   PROT 7.5 05/03/2022   ALBUMIN 4.2 05/03/2022   BILITOT 0.4 05/03/2022   ALKPHOS 57 05/03/2022   AST 19 05/03/2022   ALT 15 05/03/2022   ANIONGAP 6 03/05/2021  GFR of 76.01   Cholesterol Lab Results  Component Value Date   CHOL 158 05/03/2022   HDL 63.00 05/03/2022   LDLCALC 76 05/03/2022   TRIG 96.0 05/03/2022   CHOLHDL 3 05/03/2022   Simvastatin 20 mg daily   OSTEOPOROSIS Did not  tolerate alendronate- GI side effects  Has shot of prolia next Thursday   Patient Active Problem List   Diagnosis Date Noted   IBS (irritable bowel syndrome) 01/06/2023   Left elbow pain 07/05/2022   Numbness and tingling in both hands 07/05/2022   OSA (obstructive sleep apnea) 10/05/2021   Chest discomfort 08/01/2021   Sleep apnea 01/29/2021   Chronic headaches 12/07/2020   Hyponatremia 04/12/2020   Screening for colon cancer 10/29/2019   Stress reaction 08/31/2019   Screening mammogram for breast cancer 12/08/2017   Estrogen deficiency 12/04/2016   Grief reaction 09/09/2014   Adjustment disorder with depressed mood 09/09/2014   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   Prediabetes 08/12/2012   Other screening mammogram 03/22/2011   Hyperlipidemia 01/01/2011   Hypertension 01/01/2011   GERD (gastroesophageal reflux disease) 01/01/2011   Routine general medical examination at a health care facility 01/01/2011   Vitamin D deficiency disease 01/01/2011   Osteoporosis 01/01/2011   Anxiety 01/01/2011   Allergic rhinitis 01/01/2011   Rosacea 01/01/2011   Disorder of bone and cartilage 01/01/2011   Past Medical History:  Diagnosis Date   Allergy  Bronchitis    Colon polyps    Depression    GERD (gastroesophageal reflux disease)    Heart murmur    History of chicken pox    Hx: UTI (urinary tract infection)    Hyperlipidemia    Hypertension    Migraines    PONV (postoperative nausea and vomiting)    Rosacea    Sleep apnea    uses cpap every night    Past Surgical History:  Procedure Laterality Date   COLONOSCOPY WITH PROPOFOL N/A 01/25/2021   Procedure: COLONOSCOPY WITH PROPOFOL;  Surgeon: Toney Reil, MD;  Location: [facility];  Service: Gastroenterology;  Laterality: N/A;   DNC     FOOT SURGERY     Social History   Tobacco Use   Smoking status: Former    Packs/day: 0.50    Years: 4.00    Additional pack years: 0.00    Total  pack years: 2.00    Types: Cigarettes    Quit date: 1980    Years since quitting: 44.3   Smokeless tobacco: Never  Vaping Use   Vaping Use: Never used  Substance Use Topics   Alcohol use: Yes    Alcohol/week: 0.0 standard drinks of alcohol    Comment: wine occasional   Drug use: No   Family History  Problem Relation Age of Onset   Heart disease Father    Hypertension Father    Diabetes Other    Heart disease Mother    Stroke Paternal Grandmother    Heart disease Paternal Grandfather    Hypertension Paternal Grandfather    Breast cancer Paternal Aunt    Breast cancer Sister    Cancer Sister    Bladder Cancer Neg Hx    Kidney cancer Neg Hx    Allergies  Allergen Reactions   Shellfish Allergy Anaphylaxis    Sob/ swelling   Alendronate     Nausea  Headache    Amlodipine     headache   Current Outpatient Medications on File Prior to Visit  Medication Sig Dispense Refill   aspirin 81 MG tablet Take 81 mg by mouth daily.     busPIRone (BUSPAR) 15 MG tablet Take 1 tablet (15 mg total) by mouth 2 (two) times daily. 180 tablet 3   famotidine (PEPCID) 20 MG tablet TAKE 1 TABLET BY MOUTH EVERYDAY AT BEDTIME 90 tablet 1   loratadine (CLARITIN) 10 MG tablet Take 10 mg by mouth daily.     metroNIDAZOLE (METROGEL) 1 % gel Apply topically 2 (two) times daily. 180 g 3   olmesartan (BENICAR) 40 MG tablet Take 1 tablet (40 mg total) by mouth daily. 90 tablet 3   omeprazole (PRILOSEC) 20 MG capsule TAKE 1 CAPSULE BY MOUTH TWICE A DAY 180 capsule 2   sertraline (ZOLOFT) 50 MG tablet Take 2 tablets (100 mg total) by mouth daily. 180 tablet 3   simvastatin (ZOCOR) 20 MG tablet TAKE 1 TABLET BY MOUTH DAILY AT 6 PM. 90 tablet 1   No current facility-administered medications on file prior to visit.    Review of Systems  Constitutional:  Positive for fatigue. Negative for activity change, appetite change, fever and unexpected weight change.  HENT:  Negative for congestion, ear pain,  rhinorrhea, sinus pressure and sore throat.   Eyes:  Negative for pain, redness and visual disturbance.  Respiratory:  Negative for cough, shortness of breath and wheezing.   Cardiovascular:  Negative for chest pain and palpitations.  Gastrointestinal:  Positive  for constipation and diarrhea. Negative for abdominal distention, abdominal pain, anal bleeding, blood in stool, nausea, rectal pain and vomiting.  Endocrine: Negative for polydipsia and polyuria.  Genitourinary:  Negative for dysuria, frequency and urgency.  Musculoskeletal:  Negative for arthralgias, back pain and myalgias.  Skin:  Negative for pallor and rash.  Allergic/Immunologic: Negative for environmental allergies.  Neurological:  Negative for dizziness, syncope and headaches.  Hematological:  Negative for adenopathy. Does not bruise/bleed easily.  Psychiatric/Behavioral:  Negative for decreased concentration and dysphoric mood. The patient is nervous/anxious.        Objective:   Physical Exam Constitutional:      General: She is not in acute distress.    Appearance: Normal appearance. She is well-developed. She is obese. She is not ill-appearing or diaphoretic.  HENT:     Head: Normocephalic and atraumatic.  Eyes:     General: No scleral icterus.    Conjunctiva/sclera: Conjunctivae normal.     Pupils: Pupils are equal, round, and reactive to light.  Neck:     Thyroid: No thyromegaly.     Vascular: No carotid bruit or JVD.  Cardiovascular:     Rate and Rhythm: Normal rate and regular rhythm.     Heart sounds: Normal heart sounds.     No gallop.  Pulmonary:     Effort: Pulmonary effort is normal. No respiratory distress.     Breath sounds: Normal breath sounds. No stridor. No wheezing or rales.  Abdominal:     General: There is no distension or abdominal bruit.     Palpations: Abdomen is soft. There is no mass.     Tenderness: There is no abdominal tenderness. There is no guarding or rebound.     Hernia: No  hernia is present.  Musculoskeletal:     Cervical back: Normal range of motion and neck supple.     Right lower leg: No edema.     Left lower leg: No edema.  Lymphadenopathy:     Cervical: No cervical adenopathy.  Skin:    General: Skin is warm and dry.     Coloration: Skin is not pale.     Findings: No rash.  Neurological:     Mental Status: She is alert.     Coordination: Coordination normal.     Deep Tendon Reflexes: Reflexes are normal and symmetric. Reflexes normal.  Psychiatric:        Mood and Affect: Mood normal.           Assessment & Plan:   Problem List Items Addressed This Visit       Cardiovascular and Mediastinum   Hypertension - Primary    BP: (!) 155/88  Bp is higher here than at home  Anxiety plays a role but unsure if well controlled   Continues olmesartan 40 mg daily  Tolerates this   Has baseline low na- cannot have diuretic  In past intol to amlodipine and also metoprolol   Long discussion today re proper way/time and condition to take bp (when relaxed0 Instructed to report back with readings in 1-2 wk Also referred to pharmacy for help with management in light of med intolerances       Relevant Orders   AMB Referral to Pharmacy Medication Management   Basic metabolic panel     Digestive   IBS (irritable bowel syndrome)    This is chronic On/off Some LLQ discomfort if constipated Normal exam Discussed trial of probiotic (has at home) Discussed trial of  fiber supplement as well Instructed to alert if not improved Consider GI f/u  Reviewed last colonsocopy 8/22- noted no diverticuli         Musculoskeletal and Integument   Osteoporosis    Getting ready to start prolia Labs today  Questions answered  Fall prevention is important       Relevant Orders   Basic metabolic panel     Other   Hyponatremia    Last sodium 130- baseline Cannot tolerate diuretics  Does not drink excessive fluids  Bmp today

## 2023-01-06 NOTE — Assessment & Plan Note (Signed)
Getting ready to start prolia Labs today  Questions answered  Fall prevention is important

## 2023-01-06 NOTE — Patient Instructions (Addendum)
For irritable bowel symptoms  Try the probiotic  Think about a fiber supplment like benefiber and citrucel- is over the counter  Stool softeners are fine when needed for constipation   When you take your blood pressure  Sit with both feet on the floor  Support arm at heart level  Only take blood pressure when relaxed  Only after blood pressure medicine   Continue the generic benicar   Please get [place] a list of some blood pressure readings in 1-2 weeks  I would like to do a pharmacy referral  If you don't get a call in 1-2 weeks let us know

## 2023-01-07 NOTE — Progress Notes (Signed)
   Care Guide Note  01/07/2023 Name: FRANCINE EDES MRN: [id number] DOB: July 03, 1946  Referred by: [name], [name], MD Reason for referral : Care Coordination (Outreach to schedule with pharm d )   Adriana Marks is a 77 y.o. year old female who is a primary care patient of [name], [name], MD. [name] was referred to the pharmacist for assistance related to HTN.    Successful contact was made with the patient to discuss pharmacy services including being ready for the pharmacist to call at least 5 minutes before the scheduled appointment time, to have medication bottles and any blood sugar or blood pressure readings ready for review. The patient agreed to meet with the pharmacist via with the pharmacist via telephone visit on (date/time).  01/28/2023  [name], RMA Care Guide [facility]  [place], [place] [address] Direct Dial: [phone] [email or web address]

## 2023-01-16 ENCOUNTER — Ambulatory Visit (INDEPENDENT_AMBULATORY_CARE_PROVIDER_SITE_OTHER): Payer: Medicare HMO

## 2023-01-16 DIAGNOSIS — M810 Age-related osteoporosis without current pathological fracture: Secondary | ICD-10-CM

## 2023-01-16 MED ORDER — DENOSUMAB 60 MG/ML ~~LOC~~ SOSY
60.0000 mg | PREFILLED_SYRINGE | Freq: Once | SUBCUTANEOUS | Status: AC
  Administered 2023-01-16: 60 mg via SUBCUTANEOUS

## 2023-01-16 NOTE — Progress Notes (Signed)
Per orders of Dr. Bedsole, injection of Prolia given by Kandie Keiper. Patient tolerated injection well.  

## 2023-01-28 ENCOUNTER — Other Ambulatory Visit: Payer: Medicare HMO | Admitting: Pharmacist

## 2023-01-28 NOTE — Progress Notes (Signed)
01/28/2023 Name: Adriana Marks MRN: [id number] DOB: February 20, 1946  Chief Complaint  Patient presents with   Medication Management   Hypertension    Adriana Marks is a 77 y.o. year old female who presented for a telephone visit.   They were referred to the pharmacist by their PCP for assistance in managing hypertension.   Subjective:  Care Team: Primary Care Provider: [name], MD ; Next Scheduled Visit: 04/2023  Medication Access/Adherence  Current Pharmacy:  [facility] - [place], [place] - [address] [place] [address] Phone: [phone] Fax: [phone]  [id number] - [place] [facility] - [address] [place] [address] Phone: [phone] Fax: [phone]   Patient reports affordability concerns with their medications: No  Patient reports access/transportation concerns to their pharmacy: No  Patient reports adherence concerns with their medications:  No     Hypertension:  Current medications: olmesartan 40 mg daily Medications previously tried: amlodipine - reporting feeling poorly with 5 mg; hyponatremia preventing use of diuretics; metoprolol - caused some swelling  Does have OSA - uses CPAP;   Patient has a validated, automated, upper arm home BP cuff Current blood pressure readings readings:   5/15: 3 pm: 153/89 - reports she was rushing to get her niece's to help take care of her kids 5/16: 9 am: 123/76 5/17: 4:30 pm: 134/75 5/18: 1 pm: 131/72 5/23: 10 am: 136/80  Patient denies hypotensive s/sx including dizziness, lightheadedness.  Patient denies hypertensive symptoms including headache, chest pain, shortness of breath   Depression/Anxiety:  Current medications: sertraline 100 mg daily, buspirone 15 mg twice daily  She feels that depression/anxiety are well controlled with current regimen. Does still have some stressors in her life.   Has a  community group to talk to friends. Sees about once a week. Goes out to breakfast. Has a group of friends that she feels is a good support.    Objective:  Lab Results  Component Value Date   HGBA1C 5.7 05/03/2022    Lab Results  Component Value Date   CREATININE 0.72 01/06/2023   BUN 11 01/06/2023   NA 131 (L) 01/06/2023   K 4.4 01/06/2023   CL 95 (L) 01/06/2023   CO2 27 01/06/2023    Lab Results  Component Value Date   CHOL 158 05/03/2022   HDL 63.00 05/03/2022   LDLCALC 76 05/03/2022   TRIG 96.0 05/03/2022   CHOLHDL 3 05/03/2022    Medications Reviewed Today     Reviewed by Alden Hipp, RPH-CPP (Pharmacist) on 01/28/23 at 1506  Med List Status: <None>   Medication Order Taking? Sig Documenting Provider Last Dose Status Informant  aspirin 81 MG tablet [id number] Yes Take 81 mg by mouth daily. [provider] Taking Active   busPIRone (BUSPAR) 15 MG tablet [id number] Yes Take 1 tablet (15 mg total) by mouth 2 (two) times daily. [name], [name], MD Taking Active   famotidine (PEPCID) 20 MG tablet [id number] Yes TAKE 1 TABLET BY MOUTH EVERYDAY AT BEDTIME [name], [name], MD Taking Active   loratadine (CLARITIN) 10 MG tablet [id number] Yes Take 10 mg by mouth daily. [provider] Taking Active   metroNIDAZOLE (METROGEL) 1 % gel [id number] No Apply topically 2 (two) times daily.  Patient not taking: Reported on 01/28/2023   [name], MD Not Taking Active   olmesartan (BENICAR) 40 MG tablet [id number] Yes Take 1 tablet (40 mg total)  by mouth daily. [name], [name], MD Taking Active   omeprazole (PRILOSEC) 20 MG capsule [id number] Yes TAKE 1 CAPSULE BY MOUTH TWICE A DAY [name], Marne A, MD Taking Active   sertraline (ZOLOFT) 50 MG tablet [id number] Yes Take 2 tablets (100 mg total) by mouth daily. [name], [name], MD Taking Active   simvastatin (ZOCOR) 20 MG tablet [id number] Yes TAKE 1 TABLET BY MOUTH DAILY AT 6 PM. [name], [name], MD Taking Active                Assessment/Plan:   Hypertension: - Currently moderately well controlled at home when not experiencing stress. Anxiety and white coat effect appear to play a large role.  - Reviewed long term cardiovascular and renal outcomes of uncontrolled blood pressure - Reviewed appropriate blood pressure monitoring technique and reviewed goal blood pressure. Recommended to check home blood pressure and heart rate periodically, and discussed appropriate technique for blood pressure checks.  - Could consider amlodipine 2.5 mg daily or alternative calcium channel blocker, such as felodipine. No history of hydralazine, could be next option. Patient elects to follow home readings for a few weeks before making changes.   Depression/Anxiety: - Currently controlled per patient report, but other situational stressors. Denies need for additional support such as cognitive behavioral therapy. Encouraged to continue to be mindful of when she is stressed, and think about activities/techniques that are relaxing. Recommend to continue current regimen.     Follow Up Plan: phone call in 6 weeks  Catie TClearance Coots, PharmD, BCACP, CPP [facility] [phone]

## 2023-01-28 NOTE — Patient Instructions (Addendum)
Adriana Marks,   It was great talking to you today!  Continue your current regimen.   Check your blood pressure periodically when resting and relaxed and any time you have concerning symptoms like headache, chest pain, dizziness, shortness of breath, or vision changes.   Our goal is less than 130/80.  To appropriately check your blood pressure, make sure you do the following:  1) Avoid caffeine, exercise, or tobacco products for 30 minutes before checking. Empty your bladder. 2) Sit with your back supported in a flat-backed chair. Rest your arm on something flat (arm of the chair, table, etc). 3) Sit still with your feet flat on the floor, resting, for at least 5 minutes.  4) Check your blood pressure. Take 1-2 readings.  5) Write down these readings and bring with you to any provider appointments.  Bring your home blood pressure machine with you to a provider's office for accuracy comparison at least once a year.   Make sure you take your blood pressure medications before you come to any office visit, even if you were asked to fast for labs.   Mindfulness-Based Stress Reduction Mindfulness-based stress reduction (MBSR) is a program that helps people learn to practice mindfulness. Mindfulness is the practice of consciously paying attention to the present moment. MBSR focuses on developing self-awareness, which lets you respond to life stress without judgment or negative feelings. It can be learned and practiced through techniques such as education, breathing exercises, meditation, and yoga. MBSR includes several mindfulness techniques in one program. MBSR works best when you understand the treatment, are willing to try new things, and can commit to spending time practicing what you learn. MBSR training may include learning about: How your feelings, thoughts, and reactions affect your body. New ways to respond to things that cause negative thoughts to start (triggers). How to notice your thoughts  and let go of them. Practicing awareness of everyday things that you normally do without thinking. The techniques and goals of different types of meditation. What are the benefits of MBSR? MBSR can have many benefits, which include helping you to: Develop self-awareness. This means knowing and understanding yourself. Learn skills and attitudes that help you to take part in your own health care. Learn new ways to care for yourself. Be more accepting about how things are, and let things go. Be less judgmental and approach things with an open mind. Be patient with yourself and trust yourself more. MBSR has also been shown to: Reduce negative emotions, such as sadness, overwhelm, and worry. Improve memory and focus. Change how you sense and react to pain. Boost your body's ability to fight infections. Help you connect better with other people. Improve your sense of well-being. How to practice mindfulness To do a basic awareness exercise: Find a comfortable place to sit. Pay attention to the present moment. Notice your thoughts, feelings, and surroundings just as they are. Avoid judging yourself, your feelings, or your surroundings. Make note of any judgment that comes up and let it go. Your mind may wander, and that is okay. Make note of when your thoughts drift, and return your attention to the present moment. To do basic mindfulness meditation: Find a comfortable place to sit. This may include a stable chair or a firm floor cushion. Sit upright with your back straight. Let your arms fall next to your sides, with your hands resting on your legs. If you are sitting in a chair, rest your feet flat on the floor. If you are  sitting on a cushion, cross your legs in front of you. Keep your head in a neutral position with your chin dropped slightly. Relax your jaw and rest the tip of your tongue on the roof of your mouth. Drop your gaze to the floor or close your eyes. Breathe normally and pay  attention to your breath. Feel the air moving in and out of your nose. Feel your belly expanding and relaxing with each breath. Your mind may wander, and that is okay. Make note of when your thoughts drift, and return your attention to your breath. Avoid judging yourself, your feelings, or your surroundings. Make note of any judgment or feelings that come up, let them go, and bring your attention back to your breath. When you are ready, lift your gaze or open your eyes. Pay attention to how your body feels after the meditation. Follow these instructions at home:  Find a local in-person or online MBSR program. Set aside some time regularly for mindfulness practice. Practice every day if you can. Even 10 minutes of practice is helpful. Find a mindfulness practice that works best for you. This may include one or more of the following: Meditation. This involves focusing your mind on a certain thought or activity. Breathing awareness exercises. These help you to stay present by focusing on your breath. Body scan. For this practice, you lie down and pay attention to each part of your body from head to toe. You can identify tension and soreness and consciously relax parts of your body. Yoga. Yoga involves stretching and breathing, and it can improve your ability to move and be flexible. It can also help you to test your body's limits, which can help you release stress. Mindful eating. This way of eating involves focusing on the taste, texture, color, and smell of each bite of food. This slows down eating and helps you feel full sooner. For this reason, it can be an important part of a weight loss plan. Find a podcast or recording that provides guidance for breathing awareness, body scan, or meditation exercises. You can listen to these any time when you have a free moment to rest without distractions. Follow your treatment plan as told by your health care provider. This may include taking regular medicines  and making changes to your diet or lifestyle as recommended. Where to find more information You can find more information about MBSR from: Your health care provider. Community-based meditation centers or programs. Programs offered near you. Summary Mindfulness-based stress reduction (MBSR) is a program that teaches you how to consciously pay attention to the present moment. It is used to help you deal better with daily stress, feelings, and pain. MBSR focuses on developing self-awareness, which allows you to respond to life stress without judgment or negative feelings. MBSR programs may involve learning different mindfulness practices, such as breathing exercises, meditation, yoga, body scan, or mindful eating. Find a mindfulness practice that works best for you, and set aside time for it on a regular basis. This information is not intended to replace advice given to you by your health care provider. Make sure you discuss any questions you have with your health care provider. Document Revised: 03/22/2021 Document Reviewed: 03/22/2021 Elsevier Patient Education  2024 Elsevier Inc.   Take care!  Catie Eppie Gibson, PharmD, BCACP, CPP [facility] [phone]

## 2023-01-30 ENCOUNTER — Telehealth: Payer: Self-pay | Admitting: Family Medicine

## 2023-01-30 NOTE — Telephone Encounter (Signed)
Patient contacted the office regarding billing issue with prolia, states she is being billed twice. Please advise patient [phone], thank you.

## 2023-01-31 NOTE — Telephone Encounter (Signed)
Called let know that we have submitted correction to billing and they are working on fixing. Will let her know when we hear back. If she does not get a call in two weeks she will reach out so we can follow up on it for her.

## 2023-02-19 ENCOUNTER — Other Ambulatory Visit: Payer: Self-pay | Admitting: Family Medicine

## 2023-02-19 MED ORDER — BUSPIRONE HCL 15 MG PO TABS: 15.0000 mg | ORAL_TABLET | Freq: Two times a day (BID) | ORAL | 0 refills | Status: DC

## 2023-02-19 NOTE — Addendum Note (Signed)
Addended by: Shon Millet on: 02/19/2023 11:09 AM   Modules accepted: Orders

## 2023-02-19 NOTE — Telephone Encounter (Signed)
Prescription Request  02/19/2023  LOV: 01/06/2023  What is the name of the medication or equipment? busPIRone (BUSPAR) 15 MG tablet   Have you contacted your pharmacy to request a refill? No   Which pharmacy would you like this sent to?    [id number] [address] [place] [address] Phone: [phone] Fax: [phone]    Patient notified that their request is being sent to the clinical staff for review and that they should receive a response within 2 business days.   Please advise at Mobile [phone] (mobile)

## 2023-02-24 ENCOUNTER — Other Ambulatory Visit: Payer: Self-pay | Admitting: Family Medicine

## 2023-03-07 ENCOUNTER — Other Ambulatory Visit: Payer: Self-pay | Admitting: Family Medicine

## 2023-03-18 ENCOUNTER — Other Ambulatory Visit: Payer: Medicare HMO | Admitting: Pharmacist

## 2023-03-24 ENCOUNTER — Encounter: Payer: Self-pay | Admitting: Pharmacist

## 2023-03-25 ENCOUNTER — Other Ambulatory Visit: Payer: Medicare HMO | Admitting: Pharmacist

## 2023-03-25 ENCOUNTER — Other Ambulatory Visit: Payer: Self-pay | Admitting: Family Medicine

## 2023-04-10 ENCOUNTER — Encounter (INDEPENDENT_AMBULATORY_CARE_PROVIDER_SITE_OTHER): Payer: Self-pay

## 2023-04-17 ENCOUNTER — Ambulatory Visit (INDEPENDENT_AMBULATORY_CARE_PROVIDER_SITE_OTHER): Payer: Medicare HMO

## 2023-04-17 DIAGNOSIS — Z0000 Encounter for general adult medical examination without abnormal findings: Secondary | ICD-10-CM | POA: Diagnosis not present

## 2023-04-17 NOTE — Progress Notes (Signed)
Subjective:   Adriana Marks is a 77 y.o. female who presents for Medicare Annual (Subsequent) preventive examination.  Visit Complete: Virtual  I connected with  [name] on 04/17/23 by a audio enabled telemedicine application and verified that I am speaking with the correct person using two identifiers.  Patient Location: Home  Provider Location: Office/Clinic  I discussed the limitations of evaluation and management by telemedicine. The patient expressed understanding and agreed to proceed.  Patient Medicare AWV questionnaire was completed by the patient on 04/15/2023; I have confirmed that all information answered by patient is correct and no changes since this date.  Vital Signs: Unable to obtain new vitals due to this being a telehealth visit.  Review of Systems     Cardiac Risk Factors include: advanced age (>76men, >55 women);dyslipidemia;hypertension     Objective:    Today's Vitals   There is no height or weight on file to calculate BMI.     04/17/2023    2:46 PM 04/09/2022   11:35 AM 03/05/2021   11:56 AM 01/25/2021    7:39 AM 04/05/2020    9:05 AM 11/14/2017   10:00 AM 11/13/2016    9:30 PM  Advanced Directives  Does Patient Have a Medical Advance Directive? Yes Yes No Yes Yes Yes Yes  Type of Estate agent of [place];Living will Healthcare Power of [employer] of [place];Living will Healthcare Power of [place];Living will Healthcare Power of [place];Living will  Copy of Healthcare Power of Attorney in Chart? No - copy requested Yes - validated most recent copy scanned in chart (See row information)   No - copy requested No - copy requested No - copy requested    Current Medications (verified) Outpatient Encounter Medications as of 04/17/2023  Medication Sig   aspirin 81 MG tablet Take 81 mg by mouth daily.   busPIRone (BUSPAR) 15 MG tablet Take 1 tablet (15 mg total) by mouth 2 (two) times daily.   famotidine  (PEPCID) 20 MG tablet TAKE 1 TABLET BY MOUTH EVERYDAY AT BEDTIME   loratadine (CLARITIN) 10 MG tablet Take 10 mg by mouth daily.   metroNIDAZOLE (METROGEL) 1 % gel Apply topically 2 (two) times daily.   olmesartan (BENICAR) 40 MG tablet TAKE 1 TABLET BY MOUTH EVERY DAY   omeprazole (PRILOSEC) 20 MG capsule TAKE 1 CAPSULE BY MOUTH TWICE A DAY   sertraline (ZOLOFT) 50 MG tablet TAKE 2 TABLETS BY MOUTH EVERY DAY   simvastatin (ZOCOR) 20 MG tablet TAKE 1 TABLET BY MOUTH DAILY AT 6 PM.   No facility-administered encounter medications on file as of 04/17/2023.    Allergies (verified) Shellfish allergy, Alendronate, and Amlodipine   History: Past Medical History:  Diagnosis Date   Allergy    Bronchitis    Colon polyps    Depression    GERD (gastroesophageal reflux disease)    Heart murmur    History of chicken pox    Hx: UTI (urinary tract infection)    Hyperlipidemia    Hypertension    Migraines    PONV (postoperative nausea and vomiting)    Rosacea    Sleep apnea    uses cpap every night    Past Surgical History:  Procedure Laterality Date   COLONOSCOPY WITH PROPOFOL N/A 01/25/2021   Procedure: COLONOSCOPY WITH PROPOFOL;  Surgeon: Toney Reil, MD;  Location: [facility] ENDOSCOPY;  Service: Gastroenterology;  Laterality: N/A;   DNC     FOOT SURGERY  Family History  Problem Relation Age of Onset   Heart disease Father    Hypertension Father    Diabetes Other    Heart disease Mother    Stroke Paternal Grandmother    Heart disease Paternal Grandfather    Hypertension Paternal Grandfather    Breast cancer Paternal Aunt    Breast cancer Sister    Cancer Sister    Bladder Cancer Neg Hx    Kidney cancer Neg Hx    Social History   Socioeconomic History   Marital status: Widowed    Spouse name: Not on file   Number of children: Not on file   Years of education: Not on file   Highest education level: 12th grade  Occupational History   Occupation: Associate Professor: [employer]  Tobacco Use   Smoking status: Former    Current packs/day: 0.00    Average packs/day: 0.5 packs/day for 4.0 years (2.0 ttl pk-yrs)    Types: Cigarettes    Start date: 78    Quit date: 1980    Years since quitting: 44.6   Smokeless tobacco: Never  Vaping Use   Vaping status: Never Used  Substance and Sexual Activity   Alcohol use: Yes    Alcohol/week: 0.0 standard drinks of alcohol    Comment: wine occasional   Drug use: No   Sexual activity: Never  Other Topics Concern   Not on file  Social History Narrative   Exercises 3 or more times per week walks 30 minutes at a time       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    Overload all the time    Plans to retire in 2014 if possible       Husband with heart disease -- has had bypasses                Social Determinants of Health   Financial Resource Strain: Low Risk  (04/15/2023)   Overall Financial Resource Strain (CARDIA)    Difficulty of Paying Living Expenses: Not very hard  Food Insecurity: No Food Insecurity (04/15/2023)   Hunger Vital Sign    Worried About Running Out of Food in the Last Year: Never true    Ran Out of Food in the Last Year: Never true  Transportation Needs: No Transportation Needs (04/15/2023)   PRAPARE - Administrator, Civil Service (Medical): No    Lack of Transportation (Non-Medical): No  Physical Activity: Insufficiently Active (04/15/2023)   Exercise Vital Sign    Days of Exercise per Week: 2 days    Minutes of Exercise per Session: 30 min  Stress: Stress Concern Present (04/15/2023)   [employer] of Occupational Health - Occupational Stress Questionnaire    Feeling of Stress : To some extent  Social Connections: Moderately Integrated (04/15/2023)   Social Connection and Isolation Panel [NHANES]    Frequency of Communication with Friends and Family: More than three times a week    Frequency of Social Gatherings with Friends and  Family: Twice a week    Attends Religious Services: More than 4 times per year    Active Member of [employer] or Organizations: Yes    Attends Banker Meetings: More than 4 times per year    Marital Status: Widowed    Tobacco Counseling Counseling given: Not Answered   Clinical Intake:  Pre-visit preparation completed: Yes  Pain : No/denies pain  Nutritional Risks: None Diabetes: No  How often do you need to have someone help you when you read instructions, pamphlets, or other written materials from your doctor or pharmacy?: 1 - Never  Interpreter Needed?: No  Information entered by :: NAllen LPN   Activities of Daily Living    04/15/2023    6:40 PM  In your present state of health, do you have any difficulty performing the following activities:  Hearing? 0  Vision? 0  Difficulty concentrating or making decisions? 0  Walking or climbing stairs? 0  Dressing or bathing? 0  Doing errands, shopping? 0  Preparing Food and eating ? N  Using the Toilet? N  In the past six months, have you accidently leaked urine? N  Do you have problems with loss of bowel control? N  Managing your Medications? N  Managing your Finances? N  Housekeeping or managing your Housekeeping? N    Patient Care Team: [name], [name], MD as PCP - General (Family Medicine) Otho Ket, RN as [facility], [facility] (Optometry)  Indicate any recent Medical Services you may have received from other than [facility] providers in the past year (date may be approximate).     Assessment:   This is a routine wellness examination for [facility].  Hearing/Vision screen Hearing Screening - Comments:: Denies hearing issues Vision Screening - Comments:: Regular eye exams, [facility]  Dietary issues and exercise activities discussed:     Goals Addressed             This Visit's Progress    Patient Stated       04/17/2023, Wants to lose 5  pounds       Depression Screen    04/17/2023    2:47 PM 04/09/2022   11:41 AM 04/16/2021    2:35 PM 01/29/2021    9:01 AM 12/07/2020   12:40 PM 04/05/2020    9:07 AM 10/29/2019   10:47 AM  PHQ 2/9 Scores  PHQ - 2 Score 0 0 2 1 4  0 4  PHQ- 9 Score 0  4 3 15  0 6    Fall Risk    04/15/2023    6:40 PM 04/09/2022   11:35 AM 04/16/2021    2:34 PM 06/12/2020    9:49 AM 04/05/2020    9:07 AM  Fall Risk   Falls in the past year? 0 0 0 0 0  Number falls in past yr: 0 0 0  0 0  Comment   Zero falls in the past year    Injury with Fall? 0 0  0 0  Risk for fall due to : Medication side effect    Medication side effect  Follow up Falls prevention discussed;Falls evaluation completed Falls evaluation completed;Education provided;Falls prevention discussed Falls evaluation completed Falls evaluation completed Falls evaluation completed;Falls prevention discussed     Significant value    MEDICARE RISK AT HOME: Medicare Risk at Home Any stairs in or around the home?: Yes If so, are there any without handrails?: Yes Home free of loose throw rugs in walkways, pet beds, electrical cords, etc?: Yes Adequate lighting in your home to reduce risk of falls?: Yes Life alert?: No Use of a cane, walker or w/c?: No Grab bars in the bathroom?: Yes Shower chair or bench in shower?: No Elevated toilet seat or a handicapped toilet?: No  TIMED UP AND GO:  Was the test performed?  No    Cognitive  Function:    04/05/2020    9:09 AM 11/14/2017    9:45 AM 11/13/2016    9:25 AM 11/06/2015   10:41 AM  MMSE - Mini Mental State Exam  Orientation to time 5 5 5 5   Orientation to Place 5 5 5 5   Registration 3 3 3 3   Attention/ Calculation 5 0 0 5  Recall 3 3 3 3   Language- name 2 objects  0 0 0  Language- repeat 1 1 1 1   Language- follow 3 step command  3 3 3   Language- read & follow direction  0 0 1  Write a sentence  0 0 0  Copy design  0 0 0  Total score  20 20 26         04/17/2023    2:48 PM  04/09/2022   11:36 AM  6CIT Screen  What Year? 0 points 0 points  What month? 0 points 0 points  What time? 0 points 0 points  Count back from 20 0 points 0 points  Months in reverse 0 points 0 points  Repeat phrase 0 points 0 points  Total Score 0 points 0 points    Immunizations Immunization History  Administered Date(s) Administered   Fluad Quad(high Dose 65+) 04/30/2022   Influenza, High Dose Seasonal PF 05/02/2017, 05/06/2018, 06/07/2020, 06/17/2021   Influenza,inj,Quad PF,6+ Mos 06/04/2019   Influenza-Unspecified 05/18/2013, 05/11/2016   PFIZER Comirnaty(Gray Top)Covid-19 Tri-Sucrose Vaccine 05/12/2022   PFIZER(Purple Top)SARS-COV-2 Vaccination 10/18/2019, 11/09/2019, 06/18/2020   PPD Test 02/02/2013, 04/24/2016, 12/26/2020   Pneumococcal Conjugate-13 09/09/2014   Pneumococcal Polysaccharide-23 11/14/2017   Pneumococcal-Unspecified 08/27/2012   Respiratory Syncytial Virus Vaccine,Recomb Aduvanted(Arexvy) 06/15/2022   Tdap 01/01/2011, 06/15/2022   Zoster Recombinant(Shingrix) 11/20/2021, 03/12/2022   Zoster, Live 08/13/2012    TDAP status: Up to date  Flu Vaccine status: Due, Education has been provided regarding the importance of this vaccine. Advised may receive this vaccine at local pharmacy or Health Dept. Aware to provide a copy of the vaccination record if obtained from local pharmacy or Health Dept. Verbalized acceptance and understanding.  Pneumococcal vaccine status: Up to date  Covid-19 vaccine status: Completed vaccines  Qualifies for Shingles Vaccine? Yes   Zostavax completed Yes   Shingrix Completed?: Yes  Screening Tests Health Maintenance  Topic Date Due   COVID-19 Vaccine (5 - 2023-24 season) 07/07/2022   INFLUENZA VACCINE  03/27/2023   MAMMOGRAM  06/26/2023   Medicare Annual Wellness (AWV)  04/16/2024   Colonoscopy  01/26/2031   DTaP/Tdap/Td (3 - Td or Tdap) 06/15/2032   Pneumonia Vaccine 25+ Years old  Completed   DEXA SCAN  Completed    Hepatitis C Screening  Completed   Zoster Vaccines- Shingrix  Completed   HPV VACCINES  Aged Out    Health Maintenance  Health Maintenance Due  Topic Date Due   COVID-19 Vaccine (5 - 2023-24 season) 07/07/2022   INFLUENZA VACCINE  03/27/2023    Colorectal cancer screening: No longer required.   Mammogram status: Completed 06/25/2022. Repeat every year  Bone Density status: Completed 06/25/2022.  Lung Cancer Screening: (Low Dose CT Chest recommended if Age 71-80 years, 20 pack-year currently smoking OR have quit w/in 15years.) does not qualify.   Lung Cancer Screening Referral: no  Additional Screening:  Hepatitis C Screening: does qualify; Completed 11/06/2015  Vision Screening: Recommended annual ophthalmology exams for early detection of glaucoma and other disorders of the eye. Is the patient up to date with their annual eye exam?  Yes  Who is the provider or what is the name of the office in which the patient attends annual eye exams? [facility] If pt is not established with a provider, would they like to be referred to a provider to establish care? No .   Dental Screening: Recommended annual dental exams for proper oral hygiene  Diabetic Foot Exam: n/a  Community Resource Referral / Chronic Care Management: CRR required this visit?  No   CCM required this visit?  No     Plan:     I have personally reviewed and noted the following in the patient's chart:   Medical and social history Use of alcohol, tobacco or illicit drugs  Current medications and supplements including opioid prescriptions. Patient is not currently taking opioid prescriptions. Functional ability and status Nutritional status Physical activity Advanced directives List of other physicians Hospitalizations, surgeries, and ER visits in previous 12 months Vitals Screenings to include cognitive, depression, and falls Referrals and appointments  In addition, I have reviewed and  discussed with patient certain preventive protocols, quality metrics, and best practice recommendations. A written personalized care plan for preventive services as well as general preventive health recommendations were provided to patient.     Barb Merino, LPN   1/61/0960   After Visit Summary: (MyChart) Due to this being a telephonic visit, the after visit summary with patients personalized plan was offered to patient via MyChart   Nurse Notes: none

## 2023-04-17 NOTE — Patient Instructions (Signed)
[name] , Thank you for taking time to come for your Medicare Wellness Visit. I appreciate your ongoing commitment to your health goals. Please review the following plan we discussed and let me know if I can assist you in the future.   Referrals/Orders/Follow-Ups/Clinician Recommendations: none  This is a list of the screening recommended for you and due dates:  Health Maintenance  Topic Date Due   COVID-19 Vaccine (5 - 2023-24 season) 07/07/2022   Flu Shot  03/27/2023   Mammogram  06/26/2023   Medicare Annual Wellness Visit  04/16/2024   Colon Cancer Screening  01/26/2031   DTaP/Tdap/Td vaccine (3 - Td or Tdap) 06/15/2032   Pneumonia Vaccine  Completed   DEXA scan (bone density measurement)  Completed   Hepatitis C Screening  Completed   Zoster (Shingles) Vaccine  Completed   HPV Vaccine  Aged Out    Advanced directives: (Copy Requested) Please bring a copy of your health care power of attorney and living will to the office to be added to your chart at your convenience.  Next Medicare Annual Wellness Visit scheduled for next year: Yes  Insert Preventive Care attachment Insert FALL PREVENTION attachment if needed

## 2023-05-12 ENCOUNTER — Telehealth: Payer: Self-pay | Admitting: Family Medicine

## 2023-05-12 DIAGNOSIS — M810 Age-related osteoporosis without current pathological fracture: Secondary | ICD-10-CM

## 2023-05-12 DIAGNOSIS — I10 Essential (primary) hypertension: Secondary | ICD-10-CM

## 2023-05-12 DIAGNOSIS — E7800 Pure hypercholesterolemia, unspecified: Secondary | ICD-10-CM

## 2023-05-12 DIAGNOSIS — Z79899 Other long term (current) drug therapy: Secondary | ICD-10-CM | POA: Insufficient documentation

## 2023-05-12 DIAGNOSIS — E559 Vitamin D deficiency, unspecified: Secondary | ICD-10-CM

## 2023-05-12 DIAGNOSIS — R7303 Prediabetes: Secondary | ICD-10-CM

## 2023-05-12 NOTE — Telephone Encounter (Signed)
-----   Message from Alvina Chou sent at 04/25/2023  3:47 PM EDT ----- Regarding: Lab orders for Tue, 9.17.24 Patient is scheduled for CPX labs, please order future labs, Thanks , Camelia Eng

## 2023-05-13 ENCOUNTER — Other Ambulatory Visit (INDEPENDENT_AMBULATORY_CARE_PROVIDER_SITE_OTHER): Payer: Medicare HMO

## 2023-05-13 DIAGNOSIS — E559 Vitamin D deficiency, unspecified: Secondary | ICD-10-CM | POA: Diagnosis not present

## 2023-05-13 DIAGNOSIS — E7800 Pure hypercholesterolemia, unspecified: Secondary | ICD-10-CM

## 2023-05-13 DIAGNOSIS — Z79899 Other long term (current) drug therapy: Secondary | ICD-10-CM | POA: Diagnosis not present

## 2023-05-13 DIAGNOSIS — I10 Essential (primary) hypertension: Secondary | ICD-10-CM

## 2023-05-13 DIAGNOSIS — R7303 Prediabetes: Secondary | ICD-10-CM

## 2023-05-13 LAB — COMPREHENSIVE METABOLIC PANEL
ALT: 12 U/L (ref 0–35)
AST: 16 U/L (ref 0–37)
Albumin: 4.3 g/dL (ref 3.5–5.2)
Alkaline Phosphatase: 39 U/L (ref 39–117)
BUN: 13 mg/dL (ref 6–23)
CO2: 29 meq/L (ref 19–32)
Calcium: 9.5 mg/dL (ref 8.4–10.5)
Chloride: 97 meq/L (ref 96–112)
Creatinine, Ser: 0.79 mg/dL (ref 0.40–1.20)
GFR: 73.18 mL/min (ref >60.00)
Glucose, Bld: 91 mg/dL (ref 70–99)
Potassium: 4.4 meq/L (ref 3.5–5.1)
Sodium: 132 meq/L — ABNORMAL LOW (ref 135–145)
Total Bilirubin: 0.6 mg/dL (ref 0.2–1.2)
Total Protein: 7.1 g/dL (ref 6.0–8.3)

## 2023-05-13 LAB — LIPID PANEL
Cholesterol: 181 mg/dL (ref 0–200)
HDL: 67.6 mg/dL (ref >39.00)
LDL Cholesterol: 95 mg/dL (ref 0–99)
NonHDL: 113.19
Total CHOL/HDL Ratio: 3
Triglycerides: 93 mg/dL (ref 0.0–149.0)
VLDL: 18.6 mg/dL (ref 0.0–40.0)

## 2023-05-13 LAB — CBC WITH DIFFERENTIAL/PLATELET
Basophils Absolute: 0.1 10*3/uL (ref 0.0–0.1)
Basophils Relative: 0.6 % (ref 0.0–3.0)
Eosinophils Absolute: 0.2 10*3/uL (ref 0.0–0.7)
Eosinophils Relative: 1.8 % (ref 0.0–5.0)
HCT: 41.4 % (ref 36.0–46.0)
Hemoglobin: 13.7 g/dL (ref 12.0–15.0)
Lymphocytes Relative: 18.4 % (ref 12.0–46.0)
Lymphs Abs: 1.9 10*3/uL (ref 0.7–4.0)
MCHC: 33 g/dL (ref 30.0–36.0)
MCV: 92.4 fl (ref 78.0–100.0)
Monocytes Absolute: 0.9 10*3/uL (ref 0.1–1.0)
Monocytes Relative: 9.2 % (ref 3.0–12.0)
Neutro Abs: 7.1 10*3/uL (ref 1.4–7.7)
Neutrophils Relative %: 70 % (ref 43.0–77.0)
Platelets: 290 10*3/uL (ref 150.0–400.0)
RBC: 4.48 Mil/uL (ref 3.87–5.11)
RDW: 13 % (ref 11.5–15.5)
WBC: 10.1 10*3/uL (ref 4.0–10.5)

## 2023-05-13 LAB — TSH: TSH: 2.42 u[IU]/mL (ref 0.35–5.50)

## 2023-05-13 LAB — HEMOGLOBIN A1C: Hgb A1c MFr Bld: 5.8 % (ref 4.6–6.5)

## 2023-05-13 LAB — VITAMIN D 25 HYDROXY (VIT D DEFICIENCY, FRACTURES): VITD: 53.26 ng/mL (ref 30.00–100.00)

## 2023-05-13 LAB — VITAMIN B12: Vitamin B-12: 208 pg/mL — ABNORMAL LOW (ref 211–911)

## 2023-05-20 ENCOUNTER — Encounter: Payer: Self-pay | Admitting: Family Medicine

## 2023-05-20 ENCOUNTER — Ambulatory Visit: Payer: Medicare HMO | Admitting: Family Medicine

## 2023-05-20 VITALS — BP 130/70 | HR 76 | Temp 97.9°F | Ht 62.0 in | Wt 168.2 lb

## 2023-05-20 DIAGNOSIS — M810 Age-related osteoporosis without current pathological fracture: Secondary | ICD-10-CM

## 2023-05-20 DIAGNOSIS — E871 Hypo-osmolality and hyponatremia: Secondary | ICD-10-CM | POA: Diagnosis not present

## 2023-05-20 DIAGNOSIS — E7800 Pure hypercholesterolemia, unspecified: Secondary | ICD-10-CM | POA: Diagnosis not present

## 2023-05-20 DIAGNOSIS — K219 Gastro-esophageal reflux disease without esophagitis: Secondary | ICD-10-CM

## 2023-05-20 DIAGNOSIS — Z1211 Encounter for screening for malignant neoplasm of colon: Secondary | ICD-10-CM

## 2023-05-20 DIAGNOSIS — Z79899 Other long term (current) drug therapy: Secondary | ICD-10-CM | POA: Diagnosis not present

## 2023-05-20 DIAGNOSIS — E538 Deficiency of other specified B group vitamins: Secondary | ICD-10-CM | POA: Diagnosis not present

## 2023-05-20 DIAGNOSIS — I10 Essential (primary) hypertension: Secondary | ICD-10-CM | POA: Diagnosis not present

## 2023-05-20 DIAGNOSIS — R7303 Prediabetes: Secondary | ICD-10-CM

## 2023-05-20 DIAGNOSIS — E559 Vitamin D deficiency, unspecified: Secondary | ICD-10-CM

## 2023-05-20 DIAGNOSIS — Z1231 Encounter for screening mammogram for malignant neoplasm of breast: Secondary | ICD-10-CM | POA: Insufficient documentation

## 2023-05-20 DIAGNOSIS — Z0000 Encounter for general adult medical examination without abnormal findings: Secondary | ICD-10-CM

## 2023-05-20 DIAGNOSIS — F4321 Adjustment disorder with depressed mood: Secondary | ICD-10-CM | POA: Diagnosis not present

## 2023-05-20 MED ORDER — CYANOCOBALAMIN 1000 MCG/ML IJ SOLN
1000.0000 ug | Freq: Once | INTRAMUSCULAR | Status: AC
Start: 2023-05-20 — End: 2023-05-20
  Administered 2023-05-20: 1000 ug via INTRAMUSCULAR

## 2023-05-20 NOTE — Assessment & Plan Note (Signed)
BP: 130/70  Bp is higher here than at home  Anxiety plays a role but unsure if well controlled   Continues olmesartan 40 mg daily  Tolerates this   Has baseline low na- cannot have diuretic  In past intol to amlodipine and also metoprolol   Long discussion today re proper way/time and condition to take bp (when relaxed0 Instructed to report back with readings in 1-2 wk Also referred to pharmacy for help with management in light of med intolerances

## 2023-05-20 NOTE — Assessment & Plan Note (Signed)
Fairly stable Continues zoloft 100 mg daily and buspar 15 mg bid-wants to continue them Reviewed stressors/ coping techniques/symptoms/ support sources/ tx options and side effects in detail today Has good and bad days

## 2023-05-20 NOTE — Assessment & Plan Note (Signed)
Sodium level is better at 132 Will continue to monitor  Will avoid diuretics and excessive water intake

## 2023-05-20 NOTE — Assessment & Plan Note (Signed)
New Lab Results  Component Value Date   VITAMINB12 208 (L) 05/13/2023   From ppi most likely  Given B12 shot today  Instructed to take 1000 mcg B12 daily over the counter  Re check 2 mo

## 2023-05-20 NOTE — Patient Instructions (Addendum)
Keep exercising  Add some strength training to your routine, this is important for bone and brain health and can reduce your risk of falls and help your body use insulin properly and regulate weight  Light weights, exercise bands , and internet videos are a good way to start  Yoga (chair or regular), machines , floor exercises or a gym with machines are also good options    B12 shot today  Get vitamin B12 over the counter and take 1000 mcg daily  Let's re check in 2 months   For cholesterol Avoid red meat/ fried foods/ egg yolks/ fatty breakfast meats/ butter, cheese and high fat dairy/ and shellfish   Try to get most of your carbohydrates from produce (with the exception of white potatoes) and whole grains Eat less bread/pasta/rice/snack foods/cereals/sweets and other items from the middle of the grocery store (processed carbs)     Call and schedule your mammogram / early nov  You have an order for:  []   2D Mammogram  [x]   3D Mammogram  []   Bone Density     Please call for appointment:   [x]   [facility]  [address] [place] [address]  [phone]  []   [facility])   [address]  [place], [place] [address]  [phone]  []   [facility]      [address] [place], [place]        [phone]         []   [facility]  [address] [place], [place]  [phone]  []  [facility]   [address]   [place], [place] [address]  [phone]  []  [facility]  [address] [place], [place] [address] [phone]    Make sure to wear two piece clothing  No lotions powders or deodorants the day of the appointment Make sure to bring picture ID and insurance card.  Bring list of medications you are currently  taking including any supplements.   Schedule your screening mammogram through MyChart!   Select [facility] imaging sites can now be scheduled through MyChart.  Log into your MyChart account.  Go to 'Visit' (or 'Appointments' if  on mobile App) --> Schedule an  Appointment  Under 'Select a Reason for Visit' choose the Mammogram  Screening option.  Complete the pre-visit questions  and select the time and place that  best fits your schedule

## 2023-05-20 NOTE — Assessment & Plan Note (Signed)
Dexa 05/2022  Discussed fall prevention, supplements and exercise for bone density   No falls or fracture  D level in normal range

## 2023-05-20 NOTE — Assessment & Plan Note (Signed)
Last vitamin D Lab Results  Component Value Date   VD25OH 53.26 05/13/2023   Vitamin D level is therapeutic with current supplementation Disc importance of this to bone and overall health

## 2023-05-20 NOTE — Assessment & Plan Note (Signed)
Disc goals for lipids and reasons to control them Rev last labs with pt Rev low sat fat diet in detail LDL up a bit to 95  Continues simvastatin 20 mg daily

## 2023-05-20 NOTE — Assessment & Plan Note (Signed)
Lab Results  Component Value Date   VITAMINB12 208 (L) 05/13/2023   Will supplement Last vitamin D Lab Results  Component Value Date   VD25OH 53.26 05/13/2023

## 2023-05-20 NOTE — Assessment & Plan Note (Signed)
Colonoscopy utd 01/2021  No recall due to age

## 2023-05-20 NOTE — Assessment & Plan Note (Signed)
Continues omeprazole 20 mg bid Has not been able to go without it

## 2023-05-20 NOTE — Progress Notes (Signed)
Subjective:    Patient ID: Adriana Marks, female    DOB: 1946/02/13, 77 y.o.   MRN: [id number]  HPI  Here for health maintenance exam and to review chronic medical problems   Wt Readings from Last 3 Encounters:  05/20/23 168 lb 4 oz (76.3 kg)  01/06/23 174 lb 6 oz (79.1 kg)  07/05/22 174 lb 2 oz (79 kg)   30.77 kg/m  Vitals:   05/20/23 0930 05/20/23 0931  BP: (!) 148/80 130/70  Pulse:    Temp:    SpO2:      Immunization History  Administered Date(s) Administered   Fluad Quad(high Dose 65+) 04/30/2022   Influenza, High Dose Seasonal PF 05/02/2017, 05/06/2018, 06/07/2020, 06/17/2021, 05/16/2023   Influenza,inj,Quad PF,6+ Mos 06/04/2019   Influenza-Unspecified 05/18/2013, 05/11/2016   PFIZER Comirnaty(Gray Top)Covid-19 Tri-Sucrose Vaccine 05/12/2022   PFIZER(Purple Top)SARS-COV-2 Vaccination 10/18/2019, 11/09/2019, 06/18/2020   PNEUMOCOCCAL CONJUGATE-20 05/16/2023   PPD Test 02/02/2013, 04/24/2016, 12/26/2020   Pneumococcal Conjugate-13 09/09/2014   Pneumococcal Polysaccharide-23 11/14/2017   Pneumococcal-Unspecified 08/27/2012   Respiratory Syncytial Virus Vaccine,Recomb Aduvanted(Arexvy) 06/15/2022   Tdap 01/01/2011, 06/15/2022   Zoster Recombinant(Shingrix) 11/20/2021, 03/12/2022   Zoster, Live 08/13/2012    There are no preventive care reminders to display for this patient.  Tetanus shot 2023   Had the RSV Got flu and pna shot last week    Not the covid shot yet   [place]ogram 05/2022 -[place]  Self breast exam: no lumps or changes   Gyn health -no problems    Colon cancer screening -colonoscopy 01/2021   Bone health  Dexa  05/2022  osteoporosis  Taking prolia - is due in nov / waiting on insurance  Falls - none  Fractures Supplements -vit D  Last vitamin D Lab Results  Component Value Date   VD25OH 53.26 05/13/2023    Exercise -walking  Has some weights  Helps with care of twins often / carries them      Mood    05/20/2023    8:58 AM  04/17/2023    2:47 PM 04/09/2022   11:41 AM 04/16/2021    2:35 PM 01/29/2021    9:01 AM  Depression screen PHQ 2/9  Decreased Interest 1 0 0 1 0  Down, Depressed, Hopeless 1 0 0 1 1  PHQ - 2 Score 2 0 0 2 1  Altered sleeping 1 0  1 1  Tired, decreased energy 1 0  1 1  Change in appetite 0 0  0 0  Feeling bad or failure about yourself  0 0  0 0  Trouble concentrating 0 0  0 0  Moving slowly or fidgety/restless 0 0  0 0  Suicidal thoughts 0 0  0 0  PHQ-9 Score 4 0  4 3  Difficult doing work/chores Not difficult at all Not difficult at all  Not difficult at all Not difficult at all   Zoloft 100 mg daily  Buspar 15 mg bid   Has good and bad days     HTN bp is stable today  No cp or palpitations or headaches or edema  No side effects to medicines  BP Readings from Last 3 Encounters:  05/20/23 130/70  01/16/23 136/80  01/06/23 (!) 155/88     Olmesartan 40 mg daily  Working with our pharmacist   At home much lower than here -- 130/70 is average  Goes up with stress    Baseline low na - cannot have diuretic  Intol of amlodipine  and metoprolol   Lab Results  Component Value Date   NA 132 (L) 05/13/2023   K 4.4 05/13/2023   CO2 29 05/13/2023   GLUCOSE 91 05/13/2023   BUN 13 05/13/2023   CREATININE 0.79 05/13/2023   CALCIUM 9.5 05/13/2023   GFR 73.18 05/13/2023   GFRNONAA >60 03/05/2021   Lab Results  Component Value Date   ALT 12 05/13/2023   AST 16 05/13/2023   ALKPHOS 39 05/13/2023   BILITOT 0.6 05/13/2023   Lab Results  Component Value Date   WBC 10.1 05/13/2023   HGB 13.7 05/13/2023   HCT 41.4 05/13/2023   MCV 92.4 05/13/2023   PLT 290.0 05/13/2023   Lab Results  Component Value Date   TSH 2.42 05/13/2023    GERD Omeprazole 20 mg bid  Lab Results  Component Value Date   VITAMINB12 208 (L) 05/13/2023  Pepcid 20 mg in evening    Prediabetes Lab Results  Component Value Date   HGBA1C 5.8 05/13/2023  Up from 5.7   Hyperlipidemia Lab  Results  Component Value Date   CHOL 181 05/13/2023   CHOL 158 05/03/2022   CHOL 135 04/10/2021   Lab Results  Component Value Date   HDL 67.60 05/13/2023   HDL 63.00 05/03/2022   HDL 55.60 04/10/2021   Lab Results  Component Value Date   LDLCALC 95 05/13/2023   LDLCALC 76 05/03/2022   LDLCALC 64 04/10/2021   Lab Results  Component Value Date   TRIG 93.0 05/13/2023   TRIG 96.0 05/03/2022   TRIG 79.0 04/10/2021   Lab Results  Component Value Date   CHOLHDL 3 05/13/2023   CHOLHDL 3 05/03/2022   CHOLHDL 2 04/10/2021   No results found for: "LDLDIRECT"  Simvastatin 20 mg daily      Patient Active Problem List   Diagnosis Date Noted   Encounter for screening mammogram for breast cancer 05/20/2023   B12 deficiency 05/20/2023   Current use of proton pump inhibitor 05/12/2023   IBS (irritable bowel syndrome) 01/06/2023   Numbness and tingling in both hands 07/05/2022   OSA (obstructive sleep apnea) 10/05/2021   Chest discomfort 08/01/2021   Sleep apnea 01/29/2021   Chronic headaches 12/07/2020   Hyponatremia 04/12/2020   Screening for colon cancer 10/29/2019   Stress reaction 08/31/2019   Screening mammogram for breast cancer 12/08/2017   Estrogen deficiency 12/04/2016   Grief reaction 09/09/2014   Adjustment disorder with depressed mood 09/09/2014   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   Prediabetes 08/12/2012   Hyperlipidemia 01/01/2011   Hypertension 01/01/2011   GERD (gastroesophageal reflux disease) 01/01/2011   Routine general medical examination at a health care facility 01/01/2011   Vitamin D deficiency disease 01/01/2011   Osteoporosis 01/01/2011   Anxiety 01/01/2011   Allergic rhinitis 01/01/2011   Rosacea 01/01/2011   Disorder of bone and cartilage 01/01/2011   Past Medical History:  Diagnosis Date   Allergy    Anxiety    Arthritis Year ago   Bronchitis    Cataract    Colon polyps    Depression    GERD  (gastroesophageal reflux disease)    Heart murmur    History of chicken pox    Hx: UTI (urinary tract infection)    Hyperlipidemia    Hypertension    Migraines    PONV (postoperative nausea and vomiting)    Rosacea    Sleep apnea    uses cpap every night  Past Surgical History:  Procedure Laterality Date   COLONOSCOPY WITH PROPOFOL N/A 01/25/2021   Procedure: COLONOSCOPY WITH PROPOFOL;  Surgeon: Toney Reil, MD;  Location: [facility];  Service: Gastroenterology;  Laterality: N/A;   COSMETIC SURGERY  1976   DNC     FOOT SURGERY     Social History   Tobacco Use   Smoking status: Former    Current packs/day: 0.00    Average packs/day: 0.5 packs/day for 4.0 years (2.0 ttl pk-yrs)    Types: Cigarettes    Start date: 52    Quit date: 14    Years since quitting: 44.7   Smokeless tobacco: Never  Vaping Use   Vaping status: Never Used  Substance Use Topics   Alcohol use: Not Currently    Comment: wine occasional   Drug use: Never   Family History  Problem Relation Age of Onset   Heart disease Father    Hypertension Father    Early death Father    Diabetes Other    Heart disease Mother    Stroke Paternal Grandmother    Heart disease Paternal Grandfather    Hypertension Paternal Grandfather    Breast cancer Paternal Aunt    Breast cancer Sister    Cancer Sister    Alcohol abuse Paternal Uncle    Anxiety disorder Brother    Depression Brother    Bladder Cancer Neg Hx    Kidney cancer Neg Hx    Allergies  Allergen Reactions   Shellfish Allergy Anaphylaxis    Sob/ swelling   Alendronate     Nausea  Headache    Amlodipine     headache   Current Outpatient Medications on File Prior to Visit  Medication Sig Dispense Refill   aspirin 81 MG tablet Take 81 mg by mouth daily.     busPIRone (BUSPAR) 15 MG tablet Take 1 tablet (15 mg total) by mouth 2 (two) times daily. 180 tablet 0   cyanocobalamin (VITAMIN B12) 1000 MCG tablet Take 1,000 mcg by  mouth daily.     famotidine (PEPCID) 20 MG tablet TAKE 1 TABLET BY MOUTH EVERYDAY AT BEDTIME 90 tablet 1   loratadine (CLARITIN) 10 MG tablet Take 10 mg by mouth daily.     metroNIDAZOLE (METROGEL) 1 % gel Apply topically 2 (two) times daily. 180 g 3   olmesartan (BENICAR) 40 MG tablet TAKE 1 TABLET BY MOUTH EVERY DAY 90 tablet 0   omeprazole (PRILOSEC) 20 MG capsule TAKE 1 CAPSULE BY MOUTH TWICE A DAY 180 capsule 2   PROLIA 60 MG/ML SOSY injection Inject into the skin.     sertraline (ZOLOFT) 50 MG tablet TAKE 2 TABLETS BY MOUTH EVERY DAY 180 tablet 0   simvastatin (ZOCOR) 20 MG tablet TAKE 1 TABLET BY MOUTH DAILY AT 6 PM. 90 tablet 0   No current facility-administered medications on file prior to visit.    Review of Systems  Constitutional:  Negative for activity change, appetite change, fatigue, fever and unexpected weight change.  HENT:  Negative for congestion, ear pain, rhinorrhea, sinus pressure and sore throat.   Eyes:  Negative for pain, redness and visual disturbance.  Respiratory:  Negative for cough, shortness of breath and wheezing.   Cardiovascular:  Negative for chest pain and palpitations.  Gastrointestinal:  Negative for abdominal pain, blood in stool, constipation and diarrhea.  Endocrine: Negative for polydipsia and polyuria.  Genitourinary:  Negative for dysuria, frequency and urgency.  Musculoskeletal:  Negative for arthralgias,  back pain and myalgias.  Skin:  Negative for pallor and rash.  Allergic/Immunologic: Negative for environmental allergies.  Neurological:  Negative for dizziness, syncope and headaches.  Hematological:  Negative for adenopathy. Does not bruise/bleed easily.  Psychiatric/Behavioral:  Negative for decreased concentration and dysphoric mood. The patient is not nervous/anxious.        Mood = good and bad days Good day today       Objective:   Physical Exam Constitutional:      General: She is not in acute distress.    Appearance: Normal  appearance. She is well-developed. She is obese. She is not ill-appearing or diaphoretic.  HENT:     Head: Normocephalic and atraumatic.     Right Ear: Tympanic membrane, ear canal and external ear normal.     Left Ear: Tympanic membrane, ear canal and external ear normal.     Nose: Nose normal. No congestion.     Mouth/Throat:     Mouth: Mucous membranes are moist.     Pharynx: Oropharynx is clear. No posterior oropharyngeal erythema.  Eyes:     General: No scleral icterus.    Extraocular Movements: Extraocular movements intact.     Conjunctiva/sclera: Conjunctivae normal.     Pupils: Pupils are equal, round, and reactive to light.  Neck:     Thyroid: No thyromegaly.     Vascular: No carotid bruit or JVD.  Cardiovascular:     Rate and Rhythm: Normal rate and regular rhythm.     Pulses: Normal pulses.     Heart sounds: Normal heart sounds.     No gallop.  Pulmonary:     Effort: Pulmonary effort is normal. No respiratory distress.     Breath sounds: Normal breath sounds. No wheezing.     Comments: Good air exch Chest:     Chest wall: No tenderness.  Abdominal:     General: Bowel sounds are normal. There is no distension or abdominal bruit.     Palpations: Abdomen is soft. There is no mass.     Tenderness: There is no abdominal tenderness.     Hernia: No hernia is present.  Genitourinary:    Comments: Breast exam: No mass, nodules, thickening, tenderness, bulging, retraction, inflamation, nipple discharge or skin changes noted.  No axillary or clavicular LA.     Musculoskeletal:        General: No tenderness. Normal range of motion.     Cervical back: Normal range of motion and neck supple. No rigidity. No muscular tenderness.     Right lower leg: No edema.     Left lower leg: No edema.     Comments: No kyphosis   Lymphadenopathy:     Cervical: No cervical adenopathy.  Skin:    General: Skin is warm and dry.     Coloration: Skin is not pale.     Findings: No erythema or  rash.     Comments: Fair  Solar lentigines diffusely   Neurological:     Mental Status: She is alert. Mental status is at baseline.     Cranial Nerves: No cranial nerve deficit.     Motor: No abnormal muscle tone.     Coordination: Coordination normal.     Gait: Gait normal.     Deep Tendon Reflexes: Reflexes are normal and symmetric. Reflexes normal.  Psychiatric:        Mood and Affect: Mood normal.        Cognition and Memory: Cognition and memory  normal.           Assessment & Plan:   Problem List Items Addressed This Visit       Cardiovascular and Mediastinum   Hypertension    BP: 130/70  Bp is higher here than at home  Anxiety plays a role but unsure if well controlled   Continues olmesartan 40 mg daily  Tolerates this   Has baseline low na- cannot have diuretic  In past intol to amlodipine and also metoprolol   Long discussion today re proper way/time and condition to take bp (when relaxed0 Instructed to report back with readings in 1-2 wk Also referred to pharmacy for help with management in light of med intolerances         Digestive   GERD (gastroesophageal reflux disease)    Continues omeprazole 20 mg bid Has not been able to go without it         Musculoskeletal and Integument   Osteoporosis    Dexa 05/2022  Discussed fall prevention, supplements and exercise for bone density   No falls or fracture  D level in normal range          Other   Adjustment disorder with depressed mood    Fairly stable Continues zoloft 100 mg daily and buspar 15 mg bid-wants to continue them Reviewed stressors/ coping techniques/symptoms/ support sources/ tx options and side effects in detail today Has good and bad days       B12 deficiency    New Lab Results  Component Value Date   VITAMINB12 208 (L) 05/13/2023   From ppi most likely  Given B12 shot today  Instructed to take 1000 mcg B12 daily over the counter  Re check 2 mo       Current use of  proton pump inhibitor    Lab Results  Component Value Date   VITAMINB12 208 (L) 05/13/2023   Will supplement Last vitamin D Lab Results  Component Value Date   VD25OH 53.26 05/13/2023         Encounter for screening mammogram for breast cancer    Mammogram ordered Pt will call to schedule      Relevant Orders   MM 3D SCREENING MAMMOGRAM BILATERAL BREAST   Hyperlipidemia    Disc goals for lipids and reasons to control them Rev last labs with pt Rev low sat fat diet in detail LDL up a bit to 95  Continues simvastatin 20 mg daily      Hyponatremia    Sodium level is better at 132 Will continue to monitor  Will avoid diuretics and excessive water intake      Prediabetes    Lab Results  Component Value Date   HGBA1C 5.8 05/13/2023   disc imp of low glycemic diet and wt loss to prevent DM2       Routine general medical examination at a health care facility - Primary    Reviewed health habits including diet and exercise and skin cancer prevention Reviewed appropriate screening tests for age  Also reviewed health mt list, fam hx and immunization status , as well as social and family history   See HPI Labs reviewed and ordered Mammogram ordered Dexa utd Discussed fall prevention, supplements and exercise for bone density   Colonoscopy utd 01/2021 PHQ 0       Screening for colon cancer    Colonoscopy utd 01/2021  No recall due to age       Vitamin D deficiency  disease    Last vitamin D Lab Results  Component Value Date   VD25OH 53.26 05/13/2023   Vitamin D level is therapeutic with current supplementation Disc importance of this to bone and overall health

## 2023-05-20 NOTE — Assessment & Plan Note (Signed)
Mammogram ordered °Pt will call to schedule  °

## 2023-05-20 NOTE — Assessment & Plan Note (Signed)
Reviewed health habits including diet and exercise and skin cancer prevention Reviewed appropriate screening tests for age  Also reviewed health mt list, fam hx and immunization status , as well as social and family history   See HPI Labs reviewed and ordered Mammogram ordered Dexa utd Discussed fall prevention, supplements and exercise for bone density   Colonoscopy utd 01/2021 PHQ 0

## 2023-05-20 NOTE — Assessment & Plan Note (Signed)
Lab Results  Component Value Date   HGBA1C 5.8 05/13/2023   disc imp of low glycemic diet and wt loss to prevent DM2

## 2023-05-23 ENCOUNTER — Other Ambulatory Visit: Payer: Self-pay | Admitting: Family Medicine

## 2023-05-23 NOTE — Telephone Encounter (Signed)
Looks like patient should not need the Zoloft but will be due soon for Buspar

## 2023-05-30 ENCOUNTER — Other Ambulatory Visit: Payer: Self-pay | Admitting: Family Medicine

## 2023-06-03 ENCOUNTER — Other Ambulatory Visit: Payer: Self-pay | Admitting: Family Medicine

## 2023-06-23 ENCOUNTER — Other Ambulatory Visit: Payer: Self-pay | Admitting: Family Medicine

## 2023-06-25 MED ORDER — DENOSUMAB 60 MG/ML ~~LOC~~ SOSY: 60.0000 mg | PREFILLED_SYRINGE | Freq: Once | SUBCUTANEOUS | Status: DC

## 2023-06-25 NOTE — Addendum Note (Signed)
Addended by: Donnamarie Poag on: 06/25/2023 02:18 PM   Modules accepted: Orders

## 2023-07-01 ENCOUNTER — Ambulatory Visit
Admission: RE | Admit: 2023-07-01 | Discharge: 2023-07-01 | Disposition: A | Discharge status: Home / Self Care | Payer: Medicare HMO | Source: Ambulatory Visit | Attending: Family Medicine | Admitting: Family Medicine

## 2023-07-01 DIAGNOSIS — Z1231 Encounter for screening mammogram for malignant neoplasm of breast: Secondary | ICD-10-CM | POA: Diagnosis not present

## 2023-07-08 ENCOUNTER — Other Ambulatory Visit (HOSPITAL_COMMUNITY): Payer: Self-pay

## 2023-07-16 ENCOUNTER — Ambulatory Visit: Payer: Medicare HMO | Admitting: Family Medicine

## 2023-07-16 ENCOUNTER — Encounter: Payer: Self-pay | Admitting: Family Medicine

## 2023-07-16 VITALS — BP 144/82 | HR 78 | Temp 98.4°F | Ht 62.0 in | Wt 172.4 lb

## 2023-07-16 DIAGNOSIS — I10 Essential (primary) hypertension: Secondary | ICD-10-CM | POA: Diagnosis not present

## 2023-07-16 DIAGNOSIS — J0110 Acute frontal sinusitis, unspecified: Secondary | ICD-10-CM

## 2023-07-16 DIAGNOSIS — E538 Deficiency of other specified B group vitamins: Secondary | ICD-10-CM | POA: Diagnosis not present

## 2023-07-16 DIAGNOSIS — M810 Age-related osteoporosis without current pathological fracture: Secondary | ICD-10-CM | POA: Diagnosis not present

## 2023-07-16 LAB — BASIC METABOLIC PANEL
BUN: 11 mg/dL (ref 6–23)
CO2: 30 meq/L (ref 19–32)
Calcium: 9.6 mg/dL (ref 8.4–10.5)
Chloride: 94 meq/L — ABNORMAL LOW (ref 96–112)
Creatinine, Ser: 0.69 mg/dL (ref 0.40–1.20)
GFR: 84.8 mL/min (ref >60.00)
Glucose, Bld: 97 mg/dL (ref 70–99)
Potassium: 4.1 meq/L (ref 3.5–5.1)
Sodium: 131 meq/L — ABNORMAL LOW (ref 135–145)

## 2023-07-16 LAB — VITAMIN B12: Vitamin B-12: 778 pg/mL (ref 211–911)

## 2023-07-16 MED ORDER — DENOSUMAB 60 MG/ML ~~LOC~~ SOSY: 60.0000 mg | PREFILLED_SYRINGE | Freq: Once | SUBCUTANEOUS | 0 refills | Status: AC

## 2023-07-16 MED ORDER — BENZONATATE 200 MG PO CAPS: 200.0000 mg | ORAL_CAPSULE | Freq: Three times a day (TID) | ORAL | 1 refills | Status: DC | PRN

## 2023-07-16 MED ORDER — AMOXICILLIN-POT CLAVULANATE 875-125 MG PO TABS: 1.0000 | ORAL_TABLET | Freq: Two times a day (BID) | ORAL | 0 refills | Status: DC

## 2023-07-16 NOTE — Assessment & Plan Note (Addendum)
Blood pressure is up today but also not feeling well with uri/sinusitis BP: (!) 144/82  Continues olmesartan 40 mg daily   Instructed to check at home when feeling better and message [place] with results If not at goal please follow up   Cannot take diuretic medicine due to past low na Intol to amlodipine and metoprolol in past   Bmet today as well

## 2023-07-16 NOTE — Patient Instructions (Addendum)
Mucinex DM or rotibussin DM are fine for cough and congestion  Take augmentin for sinus infection  Rest  Fluids Try tessalon for additional cough control   Blood pressure is up today   When feeling better check blood pressure at home and call or message with results    Update if not starting to improve in a week or if worsening    B12 labs today

## 2023-07-16 NOTE — Assessment & Plan Note (Signed)
Had one injection after low level of 298 Now taking 1000 mcg daily over the counter   Will check level today  Encouraged balanced diet as well

## 2023-07-16 NOTE — Progress Notes (Signed)
Subjective:    Patient ID: Adriana Marks, female    DOB: 02/03/1946, 77 y.o.   MRN: [id number]  HPI  Wt Readings from Last 3 Encounters:  07/16/23 172 lb 6 oz (78.2 kg)  05/20/23 168 lb 4 oz (76.3 kg)  01/06/23 174 lb 6 oz (79.1 kg)   31.53 kg/m  Vitals:   07/16/23 1149 07/16/23 1212  BP: (!) 146/86 (!) 144/82  Pulse: 78   Temp: 98.4 F (36.9 C)   SpO2: 96%     Pt presents with sinus symptoms  HTN Also B12 def    Started last Friday  Itchy ears and ST  No fever  Congestion and blood in mucous Some cough (worse at night) - phlegm is a little yellow    Pressure and pain in head /around eyes / worse to bend forward  Sore to touch in cheeks   Throat is sore in am and pm (better in mid day)  Over the counter Tylenol  Generic flonase  Cough syrup over the counter -robitussin ?  Once      Due for B12 labs Lab Results  Component Value Date   VITAMINB12 208 (L) 05/13/2023   Had one B12 shot  Now takes 1000 mcg daily   [facility]  Lab Results  Component Value Date   NA 132 (L) 05/13/2023   K 4.4 05/13/2023   CO2 29 05/13/2023   GLUCOSE 91 05/13/2023   BUN 13 05/13/2023   CREATININE 0.79 05/13/2023   CALCIUM 9.5 05/13/2023   GFR 73.18 05/13/2023   GFRNONAA >60 03/05/2021       Patient Active Problem List   Diagnosis Date Noted   Encounter for screening mammogram for breast cancer 05/20/2023   B12 deficiency 05/20/2023   Current use of proton pump inhibitor 05/12/2023   IBS (irritable bowel syndrome) 01/06/2023   Numbness and tingling in both hands 07/05/2022   OSA (obstructive sleep apnea) 10/05/2021   Chest discomfort 08/01/2021   Sleep apnea 01/29/2021   Chronic headaches 12/07/2020   Hyponatremia 04/12/2020   Screening for colon cancer 10/29/2019   Stress reaction 08/31/2019   Acute sinusitis 01/29/2019   Screening mammogram for breast cancer 12/08/2017   Estrogen deficiency 12/04/2016    Grief reaction 09/09/2014   Adjustment disorder with depressed mood 09/09/2014   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   Prediabetes 08/12/2012   Hyperlipidemia 01/01/2011   Hypertension 01/01/2011   GERD (gastroesophageal reflux disease) 01/01/2011   Routine general medical examination at a health care facility 01/01/2011   Vitamin D deficiency disease 01/01/2011   Osteoporosis 01/01/2011   Anxiety 01/01/2011   Allergic rhinitis 01/01/2011   Rosacea 01/01/2011   Disorder of bone and cartilage 01/01/2011   Past Medical History:  Diagnosis Date   Allergy    Anxiety    Arthritis Year ago   Bronchitis    Cataract    Colon polyps    Depression    GERD (gastroesophageal reflux disease)    Heart murmur    History of chicken pox    Hx: UTI (urinary tract infection)    Hyperlipidemia    Hypertension    Migraines    PONV (postoperative nausea and vomiting)    Rosacea    Sleep apnea    uses cpap every night    Past Surgical History:  Procedure Laterality Date   COLONOSCOPY WITH PROPOFOL N/A 01/25/2021  Procedure: COLONOSCOPY WITH PROPOFOL;  Surgeon: Toney Reil, MD;  Location: [facility] ENDOSCOPY;  Service: Gastroenterology;  Laterality: N/A;   COSMETIC SURGERY  1976   DNC     FOOT SURGERY     Social History   Tobacco Use   Smoking status: Former    Current packs/day: 0.00    Average packs/day: 0.5 packs/day for 4.0 years (2.0 ttl pk-yrs)    Types: Cigarettes    Start date: 71    Quit date: 40    Years since quitting: 44.9   Smokeless tobacco: Never  Vaping Use   Vaping status: Never Used  Substance Use Topics   Alcohol use: Not Currently    Comment: wine occasional   Drug use: Never   Family History  Problem Relation Age of Onset   Heart disease Father    Hypertension Father    Early death Father    Diabetes Other    Heart disease Mother    Stroke Paternal Grandmother    Heart disease Paternal Grandfather    Hypertension  Paternal Grandfather    Breast cancer Paternal Aunt    Breast cancer Sister    Cancer Sister    Alcohol abuse Paternal Uncle    Anxiety disorder Brother    Depression Brother    Bladder Cancer Neg Hx    Kidney cancer Neg Hx    Allergies  Allergen Reactions   Shellfish Allergy Anaphylaxis    Sob/ swelling   Alendronate     Nausea  Headache    Amlodipine     headache   Current Outpatient Medications on File Prior to Visit  Medication Sig Dispense Refill   aspirin 81 MG tablet Take 81 mg by mouth daily.     busPIRone (BUSPAR) 15 MG tablet TAKE 1 TABLET BY MOUTH 2 TIMES DAILY. 180 tablet 3   cyanocobalamin (VITAMIN B12) 1000 MCG tablet Take 1,000 mcg by mouth daily.     famotidine (PEPCID) 20 MG tablet TAKE 1 TABLET BY MOUTH EVERYDAY AT BEDTIME 90 tablet 2   loratadine (CLARITIN) 10 MG tablet Take 10 mg by mouth daily.     metroNIDAZOLE (METROGEL) 1 % gel Apply topically 2 (two) times daily. 180 g 3   olmesartan (BENICAR) 40 MG tablet TAKE 1 TABLET BY MOUTH EVERY DAY 90 tablet 2   omeprazole (PRILOSEC) 20 MG capsule TAKE 1 CAPSULE BY MOUTH TWICE A DAY 180 capsule 2   PROLIA 60 MG/ML SOSY injection Inject into the skin.     sertraline (ZOLOFT) 50 MG tablet TAKE 2 TABLETS BY MOUTH EVERY DAY 180 tablet 3   simvastatin (ZOCOR) 20 MG tablet TAKE 1 TABLET BY MOUTH DAILY AT 6 PM. 90 tablet 2   Current Facility-Administered Medications on File Prior to Visit  Medication Dose Route Frequency Provider Last Rate Last Admin   denosumab (PROLIA) injection 60 mg  60 mg Subcutaneous Once Hopelynn Gartland, [name], MD        Review of Systems  Constitutional:  Positive for appetite change. Negative for fatigue and fever.  HENT:  Positive for congestion, ear pain, postnasal drip, rhinorrhea, sinus pressure and sore throat. Negative for nosebleeds.   Eyes:  Negative for pain, redness and itching.  Respiratory:  Positive for cough. Negative for shortness of breath and wheezing.   Cardiovascular:  Negative  for chest pain.  Gastrointestinal:  Negative for abdominal pain, diarrhea, nausea and vomiting.  Endocrine: Negative for polyuria.  Genitourinary:  Negative for dysuria, frequency and  urgency.  Musculoskeletal:  Negative for arthralgias and myalgias.  Allergic/Immunologic: Negative for immunocompromised state.  Neurological:  Positive for headaches. Negative for dizziness, tremors, syncope, weakness and numbness.  Hematological:  Negative for adenopathy. Does not bruise/bleed easily.  Psychiatric/Behavioral:  Negative for dysphoric mood. The patient is not nervous/anxious.        Objective:   Physical Exam Constitutional:      General: She is not in acute distress.    Appearance: Normal appearance. She is well-developed. She is obese. She is not ill-appearing.  HENT:     Head: Normocephalic and atraumatic.     Comments: Some mild left maxillary sinus tenderness    Right Ear: Tympanic membrane, ear canal and external ear normal.     Left Ear: Tympanic membrane, ear canal and external ear normal.     Nose: Congestion and rhinorrhea present.     Mouth/Throat:     Pharynx: Oropharynx is clear. No oropharyngeal exudate or posterior oropharyngeal erythema.     Comments: Clear pnd Eyes:     General:        Right eye: No discharge.        Left eye: No discharge.     Conjunctiva/sclera: Conjunctivae normal.     Pupils: Pupils are equal, round, and reactive to light.  Cardiovascular:     Rate and Rhythm: Normal rate and regular rhythm.  Pulmonary:     Effort: Pulmonary effort is normal. No respiratory distress.     Breath sounds: Normal breath sounds. No stridor. No wheezing, rhonchi or rales.     Comments: Good air exch No rales or rhonchi  Dry sounding cough Musculoskeletal:     Cervical back: Normal range of motion and neck supple.  Lymphadenopathy:     Cervical: No cervical adenopathy.  Skin:    General: Skin is warm and dry.     Findings: No rash.  Neurological:     Mental  Status: She is alert.     Cranial Nerves: No cranial nerve deficit.     Coordination: Coordination normal.  Psychiatric:        Mood and Affect: Mood normal.           Assessment & Plan:   Problem List Items Addressed This Visit       Cardiovascular and Mediastinum   Hypertension    Blood pressure is up today but also not feeling well with uri/sinusitis BP: (!) 144/82  Continues olmesartan 40 mg daily   Instructed to check at home when feeling better and message [place] with results If not at goal please follow up   Cannot take diuretic medicine due to past low na Intol to amlodipine and metoprolol in past   Bmet today as well      Relevant Orders   Basic metabolic panel     Respiratory   Acute sinusitis - Primary    S/p approx 1 wk of viral uri (caught from grand child who had non covid illness so she did not test for covid) Persistent cough (scant wheeze occational)  Sinus pain and purulent nasal mucous   Reassuring exam  Prescription augmentin for infection  Also tessalon for cough  Discussed symptomatic care-see AVS  Update if not starting to improve in a week or if worsening  Call back and Er precautions noted in detail today        Relevant Medications   benzonatate (TESSALON) 200 MG capsule   amoxicillin-clavulanate (AUGMENTIN) 875-125 MG tablet  Musculoskeletal and Integument   Osteoporosis    Pt needs prolia sent to specialized pharmacy to check price and see if affordable  Has been getting this and price may have changed   Bmet today as well      Relevant Medications   denosumab (PROLIA) 60 MG/ML SOSY injection   Other Relevant Orders   Basic metabolic panel     Other   B12 deficiency    Had one injection after low level of 298 Now taking 1000 mcg daily over the counter   Will check level today  Encouraged balanced diet as well

## 2023-07-16 NOTE — Assessment & Plan Note (Addendum)
Pt needs prolia sent to specialized pharmacy to check price and see if affordable  Has been getting this and price may have changed   Bmet today as well

## 2023-07-16 NOTE — Assessment & Plan Note (Signed)
S/p approx 1 wk of viral uri (caught from grand child who had non covid illness so she did not test for covid) Persistent cough (scant wheeze occational)  Sinus pain and purulent nasal mucous   Reassuring exam  Prescription augmentin for infection  Also tessalon for cough  Discussed symptomatic care-see AVS  Update if not starting to improve in a week or if worsening  Call back and Er precautions noted in detail today

## 2023-07-18 ENCOUNTER — Telehealth: Payer: Self-pay | Admitting: Family Medicine

## 2023-07-18 NOTE — Telephone Encounter (Signed)
Pharmacy called to get clarification on medication  PROLIA 60 MG/ML SOSY injection He said that they received the prescription on 11/20/204.

## 2023-07-21 ENCOUNTER — Other Ambulatory Visit: Payer: Medicare HMO

## 2023-08-08 NOTE — Telephone Encounter (Signed)
Reached out to pharmacy and patient I have set up appointment. She will contact pharmacy and pay co pay so that it will be sent for appointment on 12/20.

## 2023-08-08 NOTE — Telephone Encounter (Signed)
Courtney from [facility] called in and stated that patient PROLIA 48 MG/ML SOSY injection is expected to arrive August 12, 2023 and needs to be refrigerated. Thank you!

## 2023-08-11 ENCOUNTER — Telehealth: Payer: Self-pay | Admitting: Family Medicine

## 2023-08-11 NOTE — Telephone Encounter (Signed)
Called pharmacy they did not show any open notes for patient. Looks like per their chart it was shipped and should be here at any time.

## 2023-08-11 NOTE — Telephone Encounter (Signed)
Prescription Request  08/11/2023  LOV: 07/16/2023  What is the name of the medication or equipment? PROLIA 60 MG/ML SOSY injection   Have you contacted your pharmacy to request a refill? Yes - pharmacy called for pt   Which pharmacy would you like this sent to?    [facility] - [facility], [place] - [address] [place] [address] Phone: [phone] Fax: [phone]    Patient notified that their request is being sent to the clinical staff for review and that they should receive a response within 2 business days.   Please advise at [phone]

## 2023-08-14 NOTE — Telephone Encounter (Signed)
Med received and placed in the fridge with a label and a yellow dot per [employer]

## 2023-08-15 ENCOUNTER — Ambulatory Visit: Payer: Medicare HMO

## 2023-08-15 DIAGNOSIS — M8580 Other specified disorders of bone density and structure, unspecified site: Secondary | ICD-10-CM

## 2023-08-15 MED ORDER — DENOSUMAB 60 MG/ML ~~LOC~~ SOSY
60.0000 mg | PREFILLED_SYRINGE | Freq: Once | SUBCUTANEOUS | Status: AC
  Administered 2023-08-15: 60 mg via SUBCUTANEOUS

## 2023-08-15 MED ORDER — DENOSUMAB 60 MG/ML ~~LOC~~ SOSY: 60.0000 mg | PREFILLED_SYRINGE | Freq: Once | SUBCUTANEOUS | Status: DC

## 2023-08-15 NOTE — Progress Notes (Addendum)
Per orders of Dr. Roxy Manns, injection of Prolia  given by Donnamarie Poag in right deltoid. Patient tolerated injection well.advised we will call her when time for next injection. I have started order for next Prolia as well.   This medication was patient supplied order has been changed to reflect that as well.

## 2023-09-02 DIAGNOSIS — H2512 Age-related nuclear cataract, left eye: Secondary | ICD-10-CM | POA: Diagnosis not present

## 2023-09-02 DIAGNOSIS — H40003 Preglaucoma, unspecified, bilateral: Secondary | ICD-10-CM | POA: Diagnosis not present

## 2023-09-02 DIAGNOSIS — H2511 Age-related nuclear cataract, right eye: Secondary | ICD-10-CM | POA: Diagnosis not present

## 2023-09-02 DIAGNOSIS — H04123 Dry eye syndrome of bilateral lacrimal glands: Secondary | ICD-10-CM | POA: Diagnosis not present

## 2023-09-03 ENCOUNTER — Telehealth: Payer: Self-pay | Admitting: Family Medicine

## 2023-09-03 NOTE — Telephone Encounter (Signed)
 Copied from CRM [id number]. Topic: General - Call Back - No Documentation >> Sep 03, 2023  2:31 PM Joanell B wrote: Reason for CRM: Pt is needing a callback regarding getting a shot 08/15/23 and would like to confirm the person who gave her the shot will not be in any trouble because she was filling in for some else. Also is calling regarding a bill.  Unsure of what patient means by this? Looks like 12/20 patient was given prolia  by Joellen. Please advise.

## 2023-09-03 NOTE — Telephone Encounter (Signed)
 Copied from CRM [id number]. Topic: General - Other >> Sep 03, 2023  2:19 PM Robinson H wrote: Reason for CRM: Patient states on after visit summary states Idell czar gave shot on 08/15/2023 did not give injection states a girl by the last name Franchot gave injection never seen anyone named Idell Czar. Patient states she orders the Prolia  shot not the office but the after visit summary states office will order injection. Patient wants to speak to someone in office regarding her paperwork, patient already has a CRM for billing issues and similar issues as well.  Shavy [phone]

## 2023-09-03 NOTE — Telephone Encounter (Signed)
 Copied from CRM [id number]. Topic: General - Billing Inquiry >> Sep 03, 2023  1:03 PM Adriana Marks wrote: Reason for CRM: Patient is calling in stating she received a bill for over 300 dollars for her Prolia  shot. She stated that she had already paid the $100 copay to pick up the medication from the pharmacy. She stated that insurance stated [employer] had not submitted the recent claim yet but our billing department had said this was what insurance would not pay. Patient stated that this happened the last time she got this injection and that it had gotten worked out but she is hesitant to keep getting the injection if this is going to happen every month. She stated that billing stated this was coded as an office visit and she didn't know if the visit just needed to be re-coded and resubmitted to fix the issue. Please look into issue with coding and billing and advise patient on outcome or possible source of additional bill.  Can we look into this?

## 2023-09-16 NOTE — Telephone Encounter (Signed)
I have updated notes as requested.

## 2023-09-17 ENCOUNTER — Encounter: Payer: Self-pay | Admitting: Ophthalmology

## 2023-09-22 DIAGNOSIS — H2511 Age-related nuclear cataract, right eye: Secondary | ICD-10-CM | POA: Diagnosis not present

## 2023-09-29 NOTE — Discharge Instructions (Signed)

## 2023-10-01 ENCOUNTER — Ambulatory Visit
Admission: RE | Admit: 2023-10-01 | Discharge: 2023-10-01 | Disposition: A | Discharge status: Home / Self Care | Payer: Medicare HMO | Attending: Ophthalmology | Admitting: Ophthalmology

## 2023-10-01 ENCOUNTER — Ambulatory Visit: Payer: Medicare HMO | Admitting: Anesthesiology

## 2023-10-01 ENCOUNTER — Encounter
Admission: RE | Disposition: A | Discharge status: Home / Self Care | Payer: Self-pay | Source: Home / Self Care | Attending: Ophthalmology

## 2023-10-01 ENCOUNTER — Other Ambulatory Visit: Payer: Self-pay

## 2023-10-01 ENCOUNTER — Encounter: Payer: Self-pay | Admitting: Ophthalmology

## 2023-10-01 DIAGNOSIS — G4733 Obstructive sleep apnea (adult) (pediatric): Secondary | ICD-10-CM | POA: Insufficient documentation

## 2023-10-01 DIAGNOSIS — H269 Unspecified cataract: Secondary | ICD-10-CM | POA: Diagnosis not present

## 2023-10-01 DIAGNOSIS — K219 Gastro-esophageal reflux disease without esophagitis: Secondary | ICD-10-CM | POA: Insufficient documentation

## 2023-10-01 DIAGNOSIS — Z87891 Personal history of nicotine dependence: Secondary | ICD-10-CM | POA: Diagnosis not present

## 2023-10-01 DIAGNOSIS — I10 Essential (primary) hypertension: Secondary | ICD-10-CM | POA: Insufficient documentation

## 2023-10-01 DIAGNOSIS — H2511 Age-related nuclear cataract, right eye: Secondary | ICD-10-CM | POA: Diagnosis not present

## 2023-10-01 HISTORY — PX: CATARACT EXTRACTION W/PHACO: SHX586

## 2023-10-01 HISTORY — DX: Obstructive sleep apnea (adult) (pediatric): G47.33

## 2023-10-01 SURGERY — PHACOEMULSIFICATION, CATARACT, WITH IOL INSERTION
Anesthesia: Monitor Anesthesia Care | Laterality: Right

## 2023-10-01 MED ORDER — FENTANYL CITRATE (PF) 100 MCG/2ML IJ SOLN
INTRAMUSCULAR | Status: DC | PRN
  Administered 2023-10-01: 50 ug via INTRAVENOUS

## 2023-10-01 MED ORDER — SIGHTPATH DOSE#1 NA HYALUR & NA CHOND-NA HYALUR IO KIT
PACK | INTRAOCULAR | Status: DC | PRN
  Administered 2023-10-01: 1 via OPHTHALMIC

## 2023-10-01 MED ORDER — ARMC OPHTHALMIC DILATING DROPS
1.0000 | OPHTHALMIC | Status: DC | PRN
  Administered 2023-10-01 (×3): 1 via OPHTHALMIC

## 2023-10-01 MED ORDER — SIGHTPATH DOSE#1 BSS IO SOLN
INTRAOCULAR | Status: DC | PRN
  Administered 2023-10-01: 2 mL

## 2023-10-01 MED ORDER — MIDAZOLAM HCL 2 MG/2ML IJ SOLN
INTRAMUSCULAR | Status: DC | PRN
  Administered 2023-10-01: 1 mg via INTRAVENOUS

## 2023-10-01 MED ORDER — FENTANYL CITRATE (PF) 100 MCG/2ML IJ SOLN
INTRAMUSCULAR | Status: AC
  Filled 2023-10-01: qty 2

## 2023-10-01 MED ORDER — BRIMONIDINE TARTRATE-TIMOLOL 0.2-0.5 % OP SOLN
OPHTHALMIC | Status: DC | PRN
  Administered 2023-10-01: 1 [drp] via OPHTHALMIC

## 2023-10-01 MED ORDER — SIGHTPATH DOSE#1 BSS IO SOLN
INTRAOCULAR | Status: DC | PRN
  Administered 2023-10-01: 15 mL via INTRAOCULAR

## 2023-10-01 MED ORDER — MIDAZOLAM HCL 2 MG/2ML IJ SOLN
INTRAMUSCULAR | Status: AC
Start: 2023-10-01 — End: ?
  Filled 2023-10-01: qty 2

## 2023-10-01 MED ORDER — CEFUROXIME OPHTHALMIC INJECTION 1 MG/0.1 ML
INJECTION | OPHTHALMIC | Status: DC | PRN
  Administered 2023-10-01: 1 mg via INTRACAMERAL

## 2023-10-01 MED ORDER — SIGHTPATH DOSE#1 BSS IO SOLN
INTRAOCULAR | Status: DC | PRN
  Administered 2023-10-01: 63 mL via OPHTHALMIC

## 2023-10-01 MED ORDER — ARMC OPHTHALMIC DILATING DROPS
OPHTHALMIC | Status: AC
  Filled 2023-10-01: qty 0.5

## 2023-10-01 MED ORDER — TETRACAINE HCL 0.5 % OP SOLN
1.0000 [drp] | OPHTHALMIC | Status: DC | PRN
  Administered 2023-10-01 (×3): 1 [drp] via OPHTHALMIC

## 2023-10-01 MED ORDER — TETRACAINE HCL 0.5 % OP SOLN
OPHTHALMIC | Status: AC
  Filled 2023-10-01: qty 4

## 2023-10-01 SURGICAL SUPPLY — 8 items
CATARACT SUITE SIGHTPATH (MISCELLANEOUS) ×1 IMPLANT
FEE CATARACT SUITE SIGHTPATH (MISCELLANEOUS) ×1 IMPLANT
GLOVE SRG 8 PF TXTR STRL LF DI (GLOVE) ×1 IMPLANT
GLOVE SURG ENC TEXT LTX SZ7.5 (GLOVE) ×1 IMPLANT
LENS IOL TECNIS EYHANCE 25.5 (Intraocular Lens) IMPLANT
NDL FILTER BLUNT 18X1 1/2 (NEEDLE) ×1 IMPLANT
NEEDLE FILTER BLUNT 18X1 1/2 (NEEDLE) ×1 IMPLANT
SYR 3ML LL SCALE MARK (SYRINGE) ×1 IMPLANT

## 2023-10-01 NOTE — Anesthesia Preprocedure Evaluation (Addendum)
Anesthesia Evaluation  Patient identified by MRN, date of birth, ID band Patient awake    Reviewed: Allergy & Precautions, H&P , NPO status , Patient's Chart, lab work & pertinent test results  History of Anesthesia Complications (+) PONV and history of anesthetic complications  Airway Mallampati: III  TM Distance: <3 FB Neck ROM: Full    Dental no notable dental hx.    Pulmonary sleep apnea , former smoker   Pulmonary exam normal breath sounds clear to auscultation       Cardiovascular hypertension, Normal cardiovascular exam+ Valvular Problems/Murmurs  Rhythm:Regular Rate:Normal  11-20-10 Echo  Grade I diastolic dysfunction, normal EF, normal valves   Neuro/Psych  Headaches PSYCHIATRIC DISORDERS Anxiety Depression    negative neurological ROS  negative psych ROS   GI/Hepatic negative GI ROS, Neg liver ROS,GERD  ,,  Endo/Other  negative endocrine ROS    Renal/GU negative Renal ROS  negative genitourinary   Musculoskeletal negative musculoskeletal ROS (+) Arthritis ,    Abdominal   Peds negative pediatric ROS (+)  Hematology negative hematology ROS (+)   Anesthesia Other Findings Depression Bronchitis  GERD (gastroesophageal reflux disease) Allergy  Heart murmur Hypertension  Hyperlipidemia Migraines  Colon polyps Hx: UTI (urinary tract infection)  Sleep apnea Rosacea  PONV (postoperative nausea and vomiting) Anxiety  Arthritis Cataract  OSA on CPAP    Reproductive/Obstetrics negative OB ROS                              Anesthesia Physical Anesthesia Plan  ASA: 2  Anesthesia Plan: MAC   Post-op Pain Management:    Induction: Intravenous  PONV Risk Score and Plan:   Airway Management Planned: Natural Airway and Nasal Cannula  Additional Equipment:   Intra-op Plan:   Post-operative Plan:   Informed Consent: I have reviewed the patients History and Physical,  chart, labs and discussed the procedure including the risks, benefits and alternatives for the proposed anesthesia with the patient or authorized representative who has indicated his/her understanding and acceptance.     Dental Advisory Given  Plan Discussed with: Anesthesiologist, CRNA and Surgeon  Anesthesia Plan Comments: (Patient consented for risks of anesthesia including but not limited to:  - adverse reactions to medications - damage to eyes, teeth, lips or other oral mucosa - nerve damage due to positioning  - sore throat or hoarseness - Damage to heart, brain, nerves, lungs, other parts of body or loss of life  Patient voiced understanding and assent.)         Anesthesia Quick Evaluation

## 2023-10-01 NOTE — H&P (Signed)
 [facility]   Primary Care Physician:  [name], Laine LABOR, MD Ophthalmologist: Dr. Dene Etienne  Pre-Procedure History & Physical: HPI:  Adriana Marks is a 78 y.o. female here for ophthalmic surgery.   Past Medical History:  Diagnosis Date   Allergy    Anxiety    Arthritis Year ago   Bronchitis    Cataract    Colon polyps    Depression    GERD (gastroesophageal reflux disease)    Heart murmur    History of chicken pox    Hx: UTI (urinary tract infection)    Hyperlipidemia    Hypertension    Migraines    PONV (postoperative nausea and vomiting)    Rosacea    Sleep apnea    uses cpap every night     Past Surgical History:  Procedure Laterality Date   COLONOSCOPY WITH PROPOFOL  N/A 01/25/2021   Procedure: COLONOSCOPY WITH PROPOFOL ;  Surgeon: Unk Corinn Skiff, MD;  Location: [facility] ENDOSCOPY;  Service: Gastroenterology;  Laterality: N/A;   COSMETIC SURGERY  1976   DNC     FOOT SURGERY      Prior to Admission medications   Medication Sig Start Date End Date Taking? Authorizing Provider  aspirin 81 MG tablet Take 81 mg by mouth daily.   Yes [provider]  busPIRone  (BUSPAR ) 15 MG tablet TAKE 1 TABLET BY MOUTH 2 TIMES DAILY. 05/23/23  Yes [name], Laine LABOR, MD  Cholecalciferol (VITAMIN D3) 250 MCG (10000 UT) capsule Take 10,000 Units by mouth daily.   Yes [provider]  cyanocobalamin  (VITAMIN B12) 1000 MCG tablet Take 1,000 mcg by mouth daily.   Yes [provider]  famotidine  (PEPCID ) 20 MG tablet TAKE 1 TABLET BY MOUTH EVERYDAY AT BEDTIME 06/03/23  Yes [name], Marne A, MD  loratadine (CLARITIN) 10 MG tablet Take 10 mg by mouth daily.   Yes [provider]  metroNIDAZOLE  (METROGEL ) 1 % gel Apply topically 2 (two) times daily. 08/31/19  Yes [name], Laine LABOR, MD  olmesartan  (BENICAR ) 40 MG tablet TAKE 1 TABLET BY MOUTH EVERY DAY 06/02/23  Yes [name], Laine LABOR, MD  omeprazole  (PRILOSEC) 20 MG capsule TAKE 1 CAPSULE BY MOUTH TWICE A DAY  03/25/23  Yes [name], Laine LABOR, MD  PROLIA  60 MG/ML SOSY injection Inject into the skin. Every 6 months 01/06/23  Yes [provider]  sertraline  (ZOLOFT ) 50 MG tablet TAKE 2 TABLETS BY MOUTH EVERY DAY 05/23/23  Yes [name], Laine LABOR, MD  simvastatin  (ZOCOR ) 20 MG tablet TAKE 1 TABLET BY MOUTH DAILY AT 6 PM. 06/02/23  Yes [name], Laine LABOR, MD  amoxicillin -clavulanate (AUGMENTIN ) 875-125 MG tablet Take 1 tablet by mouth 2 (two) times daily. Patient not taking: Reported on 09/17/2023 07/16/23   [name], Laine LABOR, MD  benzonatate  (TESSALON ) 200 MG capsule Take 1 capsule (200 mg total) by mouth 3 (three) times daily as needed for cough. Swallow whole Patient not taking: Reported on 09/17/2023 07/16/23   Randeen Laine LABOR, MD    Allergies as of 09/04/2023 - Review Complete 07/16/2023  Allergen Reaction Noted   Shellfish allergy Anaphylaxis 04/27/2013   Alendronate   01/06/2023   Amlodipine   01/28/2020    Family History  Problem Relation Age of Onset   Heart disease Father    Hypertension Father    Early death Father    Diabetes Other    Heart disease Mother    Stroke Paternal Grandmother    Heart disease Paternal Grandfather  Hypertension Paternal Grandfather    Breast cancer Paternal Aunt    Breast cancer Sister    Cancer Sister    Alcohol abuse Paternal Uncle    Anxiety disorder Brother    Depression Brother    Bladder Cancer Neg Hx    Kidney cancer Neg Hx     Social History   Socioeconomic History   Marital status: Widowed    Spouse name: Not on file   Number of children: Not on file   Years of education: Not on file   Highest education level: Some college, no degree  Occupational History   Occupation: Fish Farm Manager: [employer]  Tobacco Use   Smoking status: Former    Current packs/day: 0.00    Average packs/day: 0.5 packs/day for 4.0 years (2.0 ttl pk-yrs)    Types: Cigarettes    Start date: 27    Quit date: 1980    Years since quitting: 45.1   Smokeless  tobacco: Never  Vaping Use   Vaping status: Never Used  Substance and Sexual Activity   Alcohol use: Not Currently    Comment: wine occasional   Drug use: Never   Sexual activity: Not Currently    Birth control/protection: None  Other Topics Concern   Not on file  Social History Narrative   Exercises 3 or more times per week walks 30 minutes at a time       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    Overload all the time    Plans to retire in 2014 if possible       Husband with heart disease -- has had bypasses                Social Drivers of Corporate Investment Banker Strain: Low Risk  (07/15/2023)   Overall Financial Resource Strain (CARDIA)    Difficulty of Paying Living Expenses: Not very hard  Food Insecurity: No Food Insecurity (07/15/2023)   Hunger Vital Sign    Worried About Running Out of Food in the Last Year: Never true    Ran Out of Food in the Last Year: Never true  Transportation Needs: No Transportation Needs (07/15/2023)   PRAPARE - Administrator, Civil Service (Medical): No    Lack of Transportation (Non-Medical): No  Physical Activity: Insufficiently Active (07/15/2023)   Exercise Vital Sign    Days of Exercise per Week: 3 days    Minutes of Exercise per Session: 30 min  Stress: Stress Concern Present (07/15/2023)   [employer] of Occupational Health - Occupational Stress Questionnaire    Feeling of Stress : To some extent  Social Connections: Moderately Integrated (07/15/2023)   Social Connection and Isolation Panel [NHANES]    Frequency of Communication with Friends and Family: More than three times a week    Frequency of Social Gatherings with Friends and Family: Three times a week    Attends Religious Services: More than 4 times per year    Active Member of Clubs or Organizations: Yes    Attends Banker Meetings: 1 to 4 times per year    Marital Status: Widowed  Intimate Partner Violence:  Not At Risk (04/17/2023)   Humiliation, Afraid, Rape, and Kick questionnaire    Fear of Current or Ex-Partner: No    Emotionally Abused: No    Physically Abused: No    Sexually Abused: No    Review of Systems:  See HPI, otherwise negative ROS  Physical Exam: BP (!) 188/85   Pulse 76   Temp (!) 97.5 F (36.4 C) (Temporal)   Resp 20   Ht 5' 2.01 (1.575 m)   Wt 80.7 kg   LMP 08/26/1997   SpO2 97%   BMI 32.55 kg/m  General:   Alert,  pleasant and cooperative in NAD Head:  Normocephalic and atraumatic. Lungs:  Clear to auscultation.    Heart:  Regular rate and rhythm.   Impression/Plan: Adriana Marks is here for ophthalmic surgery.  Risks, benefits, limitations, and alternatives regarding ophthalmic surgery have been reviewed with the patient.  Questions have been answered.  All parties agreeable.   Adriana GASKIN, MD  10/01/2023, 10:50 AM

## 2023-10-01 NOTE — Op Note (Signed)
 LOCATION:  [facility]   PREOPERATIVE DIAGNOSIS:    Nuclear sclerotic cataract right eye. H25.11   POSTOPERATIVE DIAGNOSIS:  Nuclear sclerotic cataract right eye.     PROCEDURE:  Phacoemusification with posterior chamber intraocular lens placement of the right eye   ULTRASOUND TIME: Procedure(s): CATARACT EXTRACTION PHACO AND INTRAOCULAR LENS PLACEMENT (IOC) RIGHT 7.97 00:56.0 (Right)  LENS:   Implant Name Type Inv. Item Serial No. Manufacturer Lot No. LRB No. Used Action  LENS IOL TECNIS EYHANCE 25.5 - [id number] Intraocular Lens LENS IOL TECNIS EYHANCE 25.5  SIGHT[employer]  Right 1 Implanted         SURGEON:  Dene FABIENE Etienne, MD   ANESTHESIA:  Topical with tetracaine  drops and 2% Xylocaine  jelly, augmented with 1% preservative-free intracameral lidocaine .    COMPLICATIONS:  None.   DESCRIPTION OF PROCEDURE:  The patient was identified in the holding room and transported to the operating room and placed in the supine position under the operating microscope.  The right eye was identified as the operative eye and it was prepped and draped in the usual sterile ophthalmic fashion.   A 1 millimeter clear-corneal paracentesis was made at the 12:00 position.  0.5 ml of preservative-free 1% lidocaine  was injected into the anterior chamber. The anterior chamber was filled with Viscoat viscoelastic.  A 2.4 millimeter keratome was used to make a near-clear corneal incision at the 9:00 position.  A curvilinear capsulorrhexis was made with a cystotome and capsulorrhexis forceps.  Balanced salt solution was used to hydrodissect and hydrodelineate the nucleus.   Phacoemulsification was then used in stop and chop fashion to remove the lens nucleus and epinucleus.  The remaining cortex was then removed using the irrigation and aspiration handpiece. Provisc was then placed into the capsular bag to distend it for lens placement.  A lens was then injected into the capsular bag.  The remaining  viscoelastic was aspirated.   Wounds were hydrated with balanced salt solution.  The anterior chamber was inflated to a physiologic pressure with balanced salt solution.  No wound leaks were noted. Cefuroxime  0.1 ml of a 10mg /ml solution was injected into the anterior chamber for a dose of 1 mg of intracameral antibiotic at the completion of the case.   Timolol  and Brimonidine  drops were applied to the eye.  The patient was taken to the recovery room in stable condition without complications of anesthesia or surgery.   Annella Prowell 10/01/2023, 11:56 AM

## 2023-10-01 NOTE — Transfer of Care (Signed)
 Immediate Anesthesia Transfer of Care Note  Patient: Adriana Marks  Procedure(s) Performed: CATARACT EXTRACTION PHACO AND INTRAOCULAR LENS PLACEMENT (IOC) RIGHT 7.97 00:56.0 (Right)  Patient Location: PACU  Anesthesia Type: MAC  Level of Consciousness: awake, alert  and patient cooperative  Airway and Oxygen Therapy: Patient Spontanous Breathing and Patient connected to supplemental oxygen  Post-op Assessment: Post-op Vital signs reviewed, Patient's Cardiovascular Status Stable, Respiratory Function Stable, Patent Airway and No signs of Nausea or vomiting  Post-op Vital Signs: Reviewed and stable  Complications: No notable events documented.

## 2023-10-01 NOTE — H&P (Signed)
 Adriana Marks   Primary Care Physician:  [name], Laine LABOR, MD Ophthalmologist: Dr. Dene Etienne  Pre-Procedure History & Physical: HPI:  Adriana Marks is a 78 y.o. female here for ophthalmic surgery.   Past Medical History:  Diagnosis Date   Allergy    Anxiety    Arthritis Year ago   Bronchitis    Cataract    Colon polyps    Depression    GERD (gastroesophageal reflux disease)    Heart murmur    History of chicken pox    Hx: UTI (urinary tract infection)    Hyperlipidemia    Hypertension    Migraines    OSA on CPAP    PONV (postoperative nausea and vomiting)    Rosacea    Sleep apnea    uses cpap every night     Past Surgical History:  Procedure Laterality Date   COLONOSCOPY WITH PROPOFOL  N/A 01/25/2021   Procedure: COLONOSCOPY WITH PROPOFOL ;  Surgeon: Unk Corinn Skiff, MD;  Location: [facility] ENDOSCOPY;  Service: Gastroenterology;  Laterality: N/A;   COSMETIC SURGERY  1976   DNC     FOOT SURGERY      Prior to Admission medications   Medication Sig Start Date End Date Taking? Authorizing Provider  aspirin 81 MG tablet Take 81 mg by mouth daily.   Yes [provider]  busPIRone  (BUSPAR ) 15 MG tablet TAKE 1 TABLET BY MOUTH 2 TIMES DAILY. 05/23/23  Yes [name], Laine LABOR, MD  Cholecalciferol (VITAMIN D3) 250 MCG (10000 UT) capsule Take 10,000 Units by mouth daily.   Yes [provider]  cyanocobalamin  (VITAMIN B12) 1000 MCG tablet Take 1,000 mcg by mouth daily.   Yes [provider]  famotidine  (PEPCID ) 20 MG tablet TAKE 1 TABLET BY MOUTH EVERYDAY AT BEDTIME 06/03/23  Yes [name], Marne A, MD  loratadine (CLARITIN) 10 MG tablet Take 10 mg by mouth daily.   Yes [provider]  metroNIDAZOLE  (METROGEL ) 1 % gel Apply topically 2 (two) times daily. 08/31/19  Yes [name], Laine LABOR, MD  olmesartan  (BENICAR ) 40 MG tablet TAKE 1 TABLET BY MOUTH EVERY DAY 06/02/23  Yes [name], Laine LABOR, MD  omeprazole  (PRILOSEC) 20 MG capsule TAKE 1 CAPSULE BY  MOUTH TWICE A DAY 03/25/23  Yes [name], Laine LABOR, MD  PROLIA  60 MG/ML SOSY injection Inject into the skin. Every 6 months 01/06/23  Yes [provider]  sertraline  (ZOLOFT ) 50 MG tablet TAKE 2 TABLETS BY MOUTH EVERY DAY 05/23/23  Yes [name], Marne A, MD  simvastatin  (ZOCOR ) 20 MG tablet TAKE 1 TABLET BY MOUTH DAILY AT 6 PM. 06/02/23  Yes [name], Laine LABOR, MD  amoxicillin -clavulanate (AUGMENTIN ) 875-125 MG tablet Take 1 tablet by mouth 2 (two) times daily. Patient not taking: Reported on 09/17/2023 07/16/23   [name], Laine LABOR, MD  benzonatate  (TESSALON ) 200 MG capsule Take 1 capsule (200 mg total) by mouth 3 (three) times daily as needed for cough. Swallow whole Patient not taking: Reported on 09/17/2023 07/16/23   Randeen Laine LABOR, MD    Allergies as of 09/04/2023 - Review Complete 07/16/2023  Allergen Reaction Noted   Shellfish allergy Anaphylaxis 04/27/2013   Alendronate   01/06/2023   Amlodipine   01/28/2020    Family History  Problem Relation Age of Onset   Heart disease Father    Hypertension Father    Early death Father    Diabetes Other    Heart disease Mother    Stroke Paternal Grandmother    Heart  disease Paternal Grandfather    Hypertension Paternal Grandfather    Breast cancer Paternal Aunt    Breast cancer Sister    Cancer Sister    Alcohol abuse Paternal Uncle    Anxiety disorder Brother    Depression Brother    Bladder Cancer Neg Hx    Kidney cancer Neg Hx     Social History   Socioeconomic History   Marital status: Widowed    Spouse name: Not on file   Number of children: Not on file   Years of education: Not on file   Highest education level: Some college, no degree  Occupational History   Occupation: Fish Farm Manager: [employer]  Tobacco Use   Smoking status: Former    Current packs/day: 0.00    Average packs/day: 0.5 packs/day for 4.0 years (2.0 ttl pk-yrs)    Types: Cigarettes    Start date: 17    Quit date: 1980    Years since quitting: 45.1    Smokeless tobacco: Never  Vaping Use   Vaping status: Never Used  Substance and Sexual Activity   Alcohol use: Not Currently    Comment: wine occasional   Drug use: Never   Sexual activity: Not Currently    Birth control/protection: None  Other Topics Concern   Not on file  Social History Narrative   Exercises 3 or more times per week walks 30 minutes at a time       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    Overload all the time    Plans to retire in 2014 if possible       Husband with heart disease -- has had bypasses                Social Drivers of Corporate Investment Banker Strain: Low Risk  (07/15/2023)   Overall Financial Resource Strain (CARDIA)    Difficulty of Paying Living Expenses: Not very hard  Food Insecurity: No Food Insecurity (07/15/2023)   Hunger Vital Sign    Worried About Running Out of Food in the Last Year: Never true    Ran Out of Food in the Last Year: Never true  Transportation Needs: No Transportation Needs (07/15/2023)   PRAPARE - Administrator, Civil Service (Medical): No    Lack of Transportation (Non-Medical): No  Physical Activity: Insufficiently Active (07/15/2023)   Exercise Vital Sign    Days of Exercise per Week: 3 days    Minutes of Exercise per Session: 30 min  Stress: Stress Concern Present (07/15/2023)   [employer] of Occupational Health - Occupational Stress Questionnaire    Feeling of Stress : To some extent  Social Connections: Moderately Integrated (07/15/2023)   Social Connection and Isolation Panel [NHANES]    Frequency of Communication with Friends and Family: More than three times a week    Frequency of Social Gatherings with Friends and Family: Three times a week    Attends Religious Services: More than 4 times per year    Active Member of Clubs or Organizations: Yes    Attends Banker Meetings: 1 to 4 times per year    Marital Status: Widowed  Intimate  Partner Violence: Not At Risk (04/17/2023)   Humiliation, Afraid, Rape, and Kick questionnaire    Fear of Current or Ex-Partner: No    Emotionally Abused: No    Physically Abused: No    Sexually Abused: No  Review of Systems: See HPI, otherwise negative ROS  Physical Exam: BP (!) 188/85   Pulse 76   Temp (!) 97.5 F (36.4 C) (Temporal)   Resp 20   Ht 5' 2.01 (1.575 m)   Wt 80.7 kg   LMP 08/26/1997   SpO2 97%   BMI 32.55 kg/m  General:   Alert,  pleasant and cooperative in NAD Head:  Normocephalic and atraumatic. Lungs:  Clear to auscultation.    Heart:  Regular rate and rhythm.   Impression/Plan: Donny ONEIDA Franks is here for ophthalmic surgery.  Risks, benefits, limitations, and alternatives regarding ophthalmic surgery have been reviewed with the patient.  Questions have been answered.  All parties agreeable.   MITTIE GASKIN, MD  10/01/2023, 11:18 AM

## 2023-10-01 NOTE — Anesthesia Postprocedure Evaluation (Signed)
 Anesthesia Post Note  Patient: Adriana Marks  Procedure(s) Performed: CATARACT EXTRACTION PHACO AND INTRAOCULAR LENS PLACEMENT (IOC) RIGHT 7.97 00:56.0 (Right)  Patient location during evaluation: PACU Anesthesia Type: MAC Level of consciousness: awake and alert Pain management: pain level controlled Vital Signs Assessment: post-procedure vital signs reviewed and stable Respiratory status: spontaneous breathing, nonlabored ventilation, respiratory function stable and patient connected to nasal cannula oxygen Cardiovascular status: stable and blood pressure returned to baseline Postop Assessment: no apparent nausea or vomiting Anesthetic complications: no   No notable events documented.   Last Vitals:  Vitals:   10/01/23 1156 10/01/23 1201  BP: (!) 134/116 (!) 144/88  Pulse: 69 70  Resp: 18 14  Temp: 36.5 C 36.5 C  SpO2: 100% 97%    Last Pain:  Vitals:   10/01/23 1201  TempSrc:   PainSc: 0-No pain                 Sundeep Cary C Mavrik Bynum

## 2023-10-02 ENCOUNTER — Encounter: Payer: Self-pay | Admitting: Ophthalmology

## 2023-10-02 DIAGNOSIS — H2512 Age-related nuclear cataract, left eye: Secondary | ICD-10-CM | POA: Diagnosis not present

## 2023-10-06 ENCOUNTER — Encounter: Payer: Self-pay | Admitting: Ophthalmology

## 2023-10-07 ENCOUNTER — Telehealth: Payer: Self-pay | Admitting: Family Medicine

## 2023-10-07 NOTE — Telephone Encounter (Signed)
Copied from CRM [id number]. Topic: General - Billing Inquiry >> Oct 07, 2023 11:15 AM Adriana Marks wrote: Reason for CRM: Patient received online bill stating she owes $130 - patient states this was supposed to be straightened out with her owing nothing.    Patient would like to speak with the office manager in regard to this. Requesting Marks call at 4pm.

## 2023-10-10 NOTE — Telephone Encounter (Signed)
Phone call to pt to follow up on Prolia error and where it is in billing.  Pt voiced frustration that it is not "fixed" yet.  Explained that the billing department is in the process of correcting the error and re-billing.  Pt asked if this error would happen every time that she got an injection.  Writer explained that we are putting some new procedures in place to make the "patient supplied medication" medication more clear to staff.  Pt thanked me for this information.  At the end of the conversation pt thanked for for calling her.

## 2023-10-10 NOTE — Telephone Encounter (Signed)
Per our conversation I spoke to coding. There was issue on with one of the charges not falling off when note was corrected. They are going to do a manual change and update. I think the patient would like to hear from someone other than me. Do you mind calling her?

## 2023-10-23 NOTE — Discharge Instructions (Signed)

## 2023-10-27 ENCOUNTER — Encounter
Admission: RE | Disposition: A | Discharge status: Home / Self Care | Payer: Self-pay | Source: Home / Self Care | Attending: Ophthalmology

## 2023-10-27 ENCOUNTER — Encounter: Payer: Self-pay | Admitting: Ophthalmology

## 2023-10-27 ENCOUNTER — Other Ambulatory Visit: Payer: Self-pay

## 2023-10-27 ENCOUNTER — Ambulatory Visit: Payer: Self-pay | Admitting: Anesthesiology

## 2023-10-27 ENCOUNTER — Ambulatory Visit
Admission: RE | Admit: 2023-10-27 | Discharge: 2023-10-27 | Disposition: A | Discharge status: Home / Self Care | Payer: Medicare HMO | Attending: Ophthalmology | Admitting: Ophthalmology

## 2023-10-27 DIAGNOSIS — H2512 Age-related nuclear cataract, left eye: Secondary | ICD-10-CM | POA: Diagnosis not present

## 2023-10-27 DIAGNOSIS — H269 Unspecified cataract: Secondary | ICD-10-CM | POA: Diagnosis not present

## 2023-10-27 DIAGNOSIS — Z79899 Other long term (current) drug therapy: Secondary | ICD-10-CM | POA: Diagnosis not present

## 2023-10-27 DIAGNOSIS — K219 Gastro-esophageal reflux disease without esophagitis: Secondary | ICD-10-CM | POA: Insufficient documentation

## 2023-10-27 DIAGNOSIS — M1990 Unspecified osteoarthritis, unspecified site: Secondary | ICD-10-CM | POA: Diagnosis not present

## 2023-10-27 DIAGNOSIS — G4733 Obstructive sleep apnea (adult) (pediatric): Secondary | ICD-10-CM | POA: Diagnosis not present

## 2023-10-27 DIAGNOSIS — F32A Depression, unspecified: Secondary | ICD-10-CM | POA: Insufficient documentation

## 2023-10-27 DIAGNOSIS — I10 Essential (primary) hypertension: Secondary | ICD-10-CM | POA: Diagnosis not present

## 2023-10-27 DIAGNOSIS — Z87891 Personal history of nicotine dependence: Secondary | ICD-10-CM | POA: Diagnosis not present

## 2023-10-27 DIAGNOSIS — R519 Headache, unspecified: Secondary | ICD-10-CM | POA: Diagnosis not present

## 2023-10-27 DIAGNOSIS — F419 Anxiety disorder, unspecified: Secondary | ICD-10-CM | POA: Insufficient documentation

## 2023-10-27 HISTORY — PX: CATARACT EXTRACTION W/PHACO: SHX586

## 2023-10-27 SURGERY — PHACOEMULSIFICATION, CATARACT, WITH IOL INSERTION
Anesthesia: Monitor Anesthesia Care | Laterality: Left

## 2023-10-27 MED ORDER — TETRACAINE HCL 0.5 % OP SOLN
1.0000 [drp] | OPHTHALMIC | Status: DC | PRN
  Administered 2023-10-27 (×3): 1 [drp] via OPHTHALMIC

## 2023-10-27 MED ORDER — SIGHTPATH DOSE#1 NA HYALUR & NA CHOND-NA HYALUR IO KIT
PACK | INTRAOCULAR | Status: DC | PRN
  Administered 2023-10-27: 1 via OPHTHALMIC

## 2023-10-27 MED ORDER — BRIMONIDINE TARTRATE-TIMOLOL 0.2-0.5 % OP SOLN
OPHTHALMIC | Status: DC | PRN
Start: 2023-10-27 — End: 2023-10-27
  Administered 2023-10-27: 1 [drp] via OPHTHALMIC

## 2023-10-27 MED ORDER — MIDAZOLAM HCL 2 MG/2ML IJ SOLN
INTRAMUSCULAR | Status: DC | PRN
  Administered 2023-10-27: 2 mg via INTRAVENOUS

## 2023-10-27 MED ORDER — ARMC OPHTHALMIC DILATING DROPS
OPHTHALMIC | Status: AC
  Filled 2023-10-27: qty 0.5

## 2023-10-27 MED ORDER — MIDAZOLAM HCL 2 MG/2ML IJ SOLN
INTRAMUSCULAR | Status: AC
  Filled 2023-10-27: qty 2

## 2023-10-27 MED ORDER — TETRACAINE HCL 0.5 % OP SOLN
OPHTHALMIC | Status: AC
  Filled 2023-10-27: qty 4

## 2023-10-27 MED ORDER — SIGHTPATH DOSE#1 BSS IO SOLN
INTRAOCULAR | Status: DC | PRN
  Administered 2023-10-27: 70 mL via OPHTHALMIC

## 2023-10-27 MED ORDER — ARMC OPHTHALMIC DILATING DROPS
1.0000 | OPHTHALMIC | Status: DC | PRN
  Administered 2023-10-27 (×3): 1 via OPHTHALMIC

## 2023-10-27 MED ORDER — CEFUROXIME OPHTHALMIC INJECTION 1 MG/0.1 ML
INJECTION | OPHTHALMIC | Status: DC | PRN
  Administered 2023-10-27: 1 mg via INTRACAMERAL

## 2023-10-27 MED ORDER — SIGHTPATH DOSE#1 BSS IO SOLN
INTRAOCULAR | Status: DC | PRN
  Administered 2023-10-27: 2 mL

## 2023-10-27 MED ORDER — SIGHTPATH DOSE#1 BSS IO SOLN
INTRAOCULAR | Status: DC | PRN
  Administered 2023-10-27: 15 mL via INTRAOCULAR

## 2023-10-27 SURGICAL SUPPLY — 10 items
CATARACT SUITE SIGHTPATH (MISCELLANEOUS) ×1 IMPLANT
FEE CATARACT SUITE SIGHTPATH (MISCELLANEOUS) ×1 IMPLANT
GLOVE BIOGEL PI IND STRL 8 (GLOVE) ×1 IMPLANT
GLOVE SURG LX STRL 7.5 STRW (GLOVE) ×1 IMPLANT
GLOVE SURG PROTEXIS BL SZ6.5 (GLOVE) ×1 IMPLANT
GLOVE SURG SYN 6.5 PF PI BL (GLOVE) ×1 IMPLANT
LENS IOL TECNIS EYHANCE 24.5 (Intraocular Lens) IMPLANT
NDL FILTER BLUNT 18X1 1/2 (NEEDLE) ×1 IMPLANT
NEEDLE FILTER BLUNT 18X1 1/2 (NEEDLE) ×1 IMPLANT
SYR 3ML LL SCALE MARK (SYRINGE) ×1 IMPLANT

## 2023-10-27 NOTE — Op Note (Signed)
 OPERATIVE NOTE  PANAGIOTA PERFETTI [id number] 10/27/2023   PREOPERATIVE DIAGNOSIS:  Nuclear sclerotic cataract left eye. H25.12   POSTOPERATIVE DIAGNOSIS:    Nuclear sclerotic cataract left eye.     PROCEDURE:  Phacoemusification with posterior chamber intraocular lens placement of the left eye  Ultrasound time: Procedure(s): CATARACT EXTRACTION PHACO AND INTRAOCULAR LENS PLACEMENT (IOC) LEFT 11.32 00:59.6 (Left)  LENS:   Implant Name Type Inv. Item Serial No. Manufacturer Lot No. LRB No. Used Action  LENS IOL TECNIS EYHANCE 24.5 - [id number] Intraocular Lens LENS IOL TECNIS EYHANCE 24.5 [id number] [employer]  Left 1 Implanted      SURGEON:  Deirdre Evener, MD   ANESTHESIA:  Topical with tetracaine drops and 2% Xylocaine jelly, augmented with 1% preservative-free intracameral lidocaine.    COMPLICATIONS:  None.   DESCRIPTION OF PROCEDURE:  The patient was identified in the holding room and transported to the operating room and placed in the supine position under the operating microscope.  The left eye was identified as the operative eye and it was prepped and draped in the usual sterile ophthalmic fashion.   A 1 millimeter clear-corneal paracentesis was made at the 1:30 position.  0.5 ml of preservative-free 1% lidocaine was injected into the anterior chamber.  The anterior chamber was filled with Viscoat viscoelastic.  A 2.4 millimeter keratome was used to make a near-clear corneal incision at the 10:30 position.  .  A curvilinear capsulorrhexis was made with a cystotome and capsulorrhexis forceps.  Balanced salt solution was used to hydrodissect and hydrodelineate the nucleus.   Phacoemulsification was then used in stop and chop fashion to remove the lens nucleus and epinucleus.  The remaining cortex was then removed using the irrigation and aspiration handpiece. Provisc was then placed into the capsular bag to distend it for lens placement.  A lens was then injected into the  capsular bag.  The remaining viscoelastic was aspirated.   Wounds were hydrated with balanced salt solution.  The anterior chamber was inflated to a physiologic pressure with balanced salt solution.  No wound leaks were noted. Cefuroxime 0.1 ml of a 10mg /ml solution was injected into the anterior chamber for a dose of 1 mg of intracameral antibiotic at the completion of the case.   Timolol and Brimonidine drops were applied to the eye.  The patient was taken to the recovery room in stable condition without complications of anesthesia or surgery.  Baylin Gamblin 10/27/2023, 9:22 AM

## 2023-10-27 NOTE — Anesthesia Postprocedure Evaluation (Signed)
 Anesthesia Post Note  Patient: Adriana Marks  Procedure(s) Performed: CATARACT EXTRACTION PHACO AND INTRAOCULAR LENS PLACEMENT (IOC) LEFT 11.32 00:59.6 (Left)  Patient location during evaluation: PACU Anesthesia Type: MAC Level of consciousness: awake and alert Pain management: pain level controlled Vital Signs Assessment: post-procedure vital signs reviewed and stable Respiratory status: spontaneous breathing, nonlabored ventilation, respiratory function stable and patient connected to nasal cannula oxygen Cardiovascular status: stable and blood pressure returned to baseline Postop Assessment: no apparent nausea or vomiting Anesthetic complications: no   No notable events documented.   Last Vitals:  Vitals:   10/27/23 0923 10/27/23 0929  BP: (!) 133/95 (!) 106/95  Pulse: 68 66  Resp: 13 15  Temp: (!) 36.3 C (!) 36.3 C  SpO2: 98% 100%    Last Pain:  Vitals:   10/27/23 0929  TempSrc:   PainSc: 0-No pain                 Bevely Palmer Kiera Hussey

## 2023-10-27 NOTE — Anesthesia Preprocedure Evaluation (Signed)
 Anesthesia Evaluation  Patient identified by MRN, date of birth, ID band Patient awake    Reviewed: Allergy & Precautions, H&P , NPO status , Patient's Chart, lab work & pertinent test results  History of Anesthesia Complications (+) PONV and history of anesthetic complications  Airway Mallampati: III  TM Distance: <3 FB Neck ROM: Full    Dental no notable dental hx. (+) Caps   Pulmonary neg pulmonary ROS, sleep apnea , former smoker   Pulmonary exam normal breath sounds clear to auscultation       Cardiovascular hypertension, negative cardio ROS Normal cardiovascular exam+ Valvular Problems/Murmurs  Rhythm:Regular Rate:Normal  11-20-10 Echo  Grade I diastolic dysfunction, normal EF, normal valves    Neuro/Psych  Headaches PSYCHIATRIC DISORDERS Anxiety Depression    negative neurological ROS  negative psych ROS   GI/Hepatic negative GI ROS, Neg liver ROS,GERD  ,,  Endo/Other  negative endocrine ROS    Renal/GU negative Renal ROS  negative genitourinary   Musculoskeletal negative musculoskeletal ROS (+) Arthritis ,    Abdominal   Peds negative pediatric ROS (+)  Hematology negative hematology ROS (+)   Anesthesia Other Findings Depression Bronchitis  GERD (gastroesophageal reflux disease) Allergy  Heart murmur Hypertension  Hyperlipidemia Migraines  Colon polyps Hx: UTI (urinary tract infection)  Sleep apnea Rosacea  PONV (postoperative nausea and vomiting) Anxiety  Arthritis Cataract  OSA on CPAP Previous cataract surgery 10-01-23 patient recalls people talking about Super Bowl, but no discomfort   Reproductive/Obstetrics negative OB ROS                              Anesthesia Physical Anesthesia Plan  ASA: 3  Anesthesia Plan: MAC   Post-op Pain Management:    Induction: Intravenous  PONV Risk Score and Plan:   Airway Management Planned: Natural Airway and  Nasal Cannula  Additional Equipment:   Intra-op Plan:   Post-operative Plan:   Informed Consent: I have reviewed the patients History and Physical, chart, labs and discussed the procedure including the risks, benefits and alternatives for the proposed anesthesia with the patient or authorized representative who has indicated his/her understanding and acceptance.     Dental Advisory Given  Plan Discussed with: Anesthesiologist, CRNA and Surgeon  Anesthesia Plan Comments: (Patient consented for risks of anesthesia including but not limited to:  - adverse reactions to medications - damage to eyes, teeth, lips or other oral mucosa - nerve damage due to positioning  - sore throat or hoarseness - Damage to heart, brain, nerves, lungs, other parts of body or loss of life  Patient voiced understanding and assent.)         Anesthesia Quick Evaluation

## 2023-10-27 NOTE — H&P (Signed)
 [facility]   Primary Care Physician:  [name], [name], MD Ophthalmologist: Dr. Lockie Mola  Pre-Procedure History & Physical: HPI:  Adriana Marks is a 78 y.o. female here for ophthalmic surgery.   Past Medical History:  Diagnosis Date   Allergy    Anxiety    Arthritis Year ago   Bronchitis    Cataract    Colon polyps    Depression    GERD (gastroesophageal reflux disease)    Heart murmur    History of chicken pox    Hx: UTI (urinary tract infection)    Hyperlipidemia    Hypertension    Migraines    OSA on CPAP    PONV (postoperative nausea and vomiting)    Rosacea    Sleep apnea    uses cpap every night     Past Surgical History:  Procedure Laterality Date   CATARACT EXTRACTION W/PHACO Right 10/01/2023   Procedure: CATARACT EXTRACTION PHACO AND INTRAOCULAR LENS PLACEMENT (IOC) RIGHT 7.97 00:56.0;  Surgeon: Lockie Mola, MD;  Location: [facility];  Service: Ophthalmology;  Laterality: Right;   COLONOSCOPY WITH PROPOFOL N/A 01/25/2021   Procedure: COLONOSCOPY WITH PROPOFOL;  Surgeon: Toney Reil, MD;  Location: [facility];  Service: Gastroenterology;  Laterality: N/A;   COSMETIC SURGERY  1976   DNC     FOOT SURGERY      Prior to Admission medications   Medication Sig Start Date End Date Taking? Authorizing Provider  busPIRone (BUSPAR) 15 MG tablet TAKE 1 TABLET BY MOUTH 2 TIMES DAILY. 05/23/23  Yes [name], [name], MD  Cholecalciferol (VITAMIN D3) 250 MCG (10000 UT) capsule Take 10,000 Units by mouth daily.   Yes [provider]  cyanocobalamin (VITAMIN B12) 1000 MCG tablet Take 1,000 mcg by mouth daily.   Yes [provider]  famotidine (PEPCID) 20 MG tablet TAKE 1 TABLET BY MOUTH EVERYDAY AT BEDTIME 06/03/23  Yes [name], Marne A, MD  loratadine (CLARITIN) 10 MG tablet Take 10 mg by mouth daily.   Yes [provider]  olmesartan (BENICAR) 40 MG tablet TAKE 1 TABLET BY MOUTH EVERY DAY 06/02/23  Yes  [name], [name], MD  omeprazole (PRILOSEC) 20 MG capsule TAKE 1 CAPSULE BY MOUTH TWICE A DAY 03/25/23  Yes [name], [name], MD  PROLIA 60 MG/ML SOSY injection Inject into the skin. Every 6 months 01/06/23  Yes [provider]  sertraline (ZOLOFT) 50 MG tablet TAKE 2 TABLETS BY MOUTH EVERY DAY 05/23/23  Yes [name], Marne A, MD  simvastatin (ZOCOR) 20 MG tablet TAKE 1 TABLET BY MOUTH DAILY AT 6 PM. 06/02/23  Yes [name], [name], MD  amoxicillin-clavulanate (AUGMENTIN) 875-125 MG tablet Take 1 tablet by mouth 2 (two) times daily. Patient not taking: Reported on 09/17/2023 07/16/23   [name], MD  aspirin 81 MG tablet Take 81 mg by mouth daily.    [provider]  benzonatate (TESSALON) 200 MG capsule Take 1 capsule (200 mg total) by mouth 3 (three) times daily as needed for cough. Swallow whole Patient not taking: Reported on 09/17/2023 07/16/23   [name], Idamae Schuller A, MD  metroNIDAZOLE (METROGEL) 1 % gel Apply topically 2 (two) times daily. 08/31/19   [name], [name], MD    Allergies as of 09/04/2023 - Review Complete 07/16/2023  Allergen Reaction Noted   Shellfish allergy Anaphylaxis 04/27/2013   Alendronate  01/06/2023   Amlodipine  01/28/2020    Family History  Problem Relation Age of Onset  Heart disease Father    Hypertension Father    Early death Father    Diabetes Other    Heart disease Mother    Stroke Paternal Grandmother    Heart disease Paternal Grandfather    Hypertension Paternal Grandfather    Breast cancer Paternal Aunt    Breast cancer Sister    Cancer Sister    Alcohol abuse Paternal Uncle    Anxiety disorder Brother    Depression Brother    Bladder Cancer Neg Hx    Kidney cancer Neg Hx     Social History   Socioeconomic History   Marital status: Widowed    Spouse name: Not on file   Number of children: Not on file   Years of education: Not on file   Highest education level: Some college, no degree  Occupational History   Occupation: Associate Professor: [employer]  Tobacco Use   Smoking status: Former    Current packs/day: 0.00    Average packs/day: 0.5 packs/day for 4.0 years (2.0 ttl pk-yrs)    Types: Cigarettes    Start date: 63    Quit date: 1980    Years since quitting: 45.2   Smokeless tobacco: Never  Vaping Use   Vaping status: Never Used  Substance and Sexual Activity   Alcohol use: Not Currently    Comment: wine occasional   Drug use: Never   Sexual activity: Not Currently    Birth control/protection: None  Other Topics Concern   Not on file  Social History Narrative   Exercises 3 or more times per week walks 30 minutes at a time       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    Overload all the time    Plans to retire in 2014 if possible       Husband with heart disease -- has had bypasses                Social Drivers of Corporate investment banker Strain: Low Risk  (07/15/2023)   Overall Financial Resource Strain (CARDIA)    Difficulty of Paying Living Expenses: Not very hard  Food Insecurity: No Food Insecurity (07/15/2023)   Hunger Vital Sign    Worried About Running Out of Food in the Last Year: Never true    Ran Out of Food in the Last Year: Never true  Transportation Needs: No Transportation Needs (07/15/2023)   PRAPARE - Administrator, Civil Service (Medical): No    Lack of Transportation (Non-Medical): No  Physical Activity: Insufficiently Active (07/15/2023)   Exercise Vital Sign    Days of Exercise per Week: 3 days    Minutes of Exercise per Session: 30 min  Stress: Stress Concern Present (07/15/2023)   [employer] of Occupational Health - Occupational Stress Questionnaire    Feeling of Stress : To some extent  Social Connections: Moderately Integrated (07/15/2023)   Social Connection and Isolation Panel [NHANES]    Frequency of Communication with Friends and Family: More than three times a week    Frequency of Social Gatherings with  Friends and Family: Three times a week    Attends Religious Services: More than 4 times per year    Active Member of Clubs or Organizations: Yes    Attends Banker Meetings: 1 to 4 times per year    Marital Status: Widowed  Intimate Partner Violence: Not At Risk (04/17/2023)  Humiliation, Afraid, Rape, and Kick questionnaire    Fear of Current or Ex-Partner: No    Emotionally Abused: No    Physically Abused: No    Sexually Abused: No    Review of Systems: See HPI, otherwise negative ROS  Physical Exam: Pulse 68   Temp (!) 97.2 F (36.2 C) (Temporal)   Resp (!) 21   Ht 5' 2.01" (1.575 m)   Wt 81.2 kg   LMP 08/26/1997   SpO2 97%   BMI 32.73 kg/m  General:   Alert,  pleasant and cooperative in NAD Head:  Normocephalic and atraumatic. Lungs:  Clear to auscultation.    Heart:  Regular rate and rhythm.   Impression/Plan: Adriana Marks is here for ophthalmic surgery.  Risks, benefits, limitations, and alternatives regarding ophthalmic surgery have been reviewed with the patient.  Questions have been answered.  All parties agreeable.   Lockie Mola, MD  10/27/2023, 8:41 AM

## 2023-10-27 NOTE — Transfer of Care (Signed)
 Immediate Anesthesia Transfer of Care Note  Patient: Adriana Marks  Procedure(s) Performed: CATARACT EXTRACTION PHACO AND INTRAOCULAR LENS PLACEMENT (IOC) LEFT 11.32 00:59.6 (Left)  Patient Location: PACU  Anesthesia Type: MAC  Level of Consciousness: awake, alert  and patient cooperative  Airway and Oxygen Therapy: Patient Spontanous Breathing and Patient connected to supplemental oxygen  Post-op Assessment: Post-op Vital signs reviewed, Patient's Cardiovascular Status Stable, Respiratory Function Stable, Patent Airway and No signs of Nausea or vomiting  Post-op Vital Signs: Reviewed and stable  Complications: No notable events documented.

## 2023-10-28 ENCOUNTER — Encounter: Payer: Self-pay | Admitting: Ophthalmology

## 2023-11-18 DIAGNOSIS — Z961 Presence of intraocular lens: Secondary | ICD-10-CM | POA: Diagnosis not present

## 2023-11-19 ENCOUNTER — Encounter: Payer: Self-pay | Admitting: Family Medicine

## 2023-11-19 ENCOUNTER — Ambulatory Visit (INDEPENDENT_AMBULATORY_CARE_PROVIDER_SITE_OTHER): Admitting: Family Medicine

## 2023-11-19 VITALS — BP 164/82 | HR 75 | Temp 98.4°F | Ht 62.0 in | Wt 179.2 lb

## 2023-11-19 DIAGNOSIS — I10 Essential (primary) hypertension: Secondary | ICD-10-CM | POA: Diagnosis not present

## 2023-11-19 DIAGNOSIS — G8929 Other chronic pain: Secondary | ICD-10-CM | POA: Diagnosis not present

## 2023-11-19 DIAGNOSIS — R519 Headache, unspecified: Secondary | ICD-10-CM

## 2023-11-19 DIAGNOSIS — F419 Anxiety disorder, unspecified: Secondary | ICD-10-CM | POA: Diagnosis not present

## 2023-11-19 MED ORDER — SPIRONOLACTONE 25 MG PO TABS: 25.0000 mg | ORAL_TABLET | Freq: Every day | ORAL | 0 refills | Status: DC

## 2023-11-19 NOTE — Assessment & Plan Note (Signed)
 Worse with elevated blood pressure Adding spinololactone  Follow up in 1 wk

## 2023-11-19 NOTE — Progress Notes (Signed)
 Subjective:    Patient ID: Adriana Marks, female    DOB: November 19, 1945, 78 y.o.   MRN: [id number]  HPI  Wt Readings from Last 3 Encounters:  11/19/23 179 lb 4 oz (81.3 kg)  10/27/23 179 lb (81.2 kg)  10/01/23 178 lb (80.7 kg)   32.79 kg/m  Vitals:   11/19/23 0944 11/19/23 1016  BP: (!) 166/96 (!) 164/82  Pulse: 75   Temp: 98.4 F (36.9 C)   SpO2: 97%     Pt presents for follow up of HTN   bp has been up recently at home  -usually higher in right arm  Her cuff runs a bit higher than ours  No cp or palpitations or headaches or edema  No side effects to medicines  BP Readings from Last 3 Encounters:  11/19/23 (!) 164/82  10/27/23 (!) 106/95  10/01/23 (!) 144/88     Today our cuff left 184/92 and hers 194/99 Right our cuff  right 166/96 and hers 195/108  Pulse Readings from Last 3 Encounters:  11/19/23 75  10/27/23 66  10/01/23 70   Olmesartan 40 mg daily   Some headache    In past metoprolol and amlodipine worened headaches  Hydrochlorothiazide caused low na   In the past anxiety has changed her blood pressure   Stress lately - financial worries   Feels ok otherwise   Just had cataract surgery on both eyes   Not exercising  Due to weather    Not eating well Really likes sugar  Last 2 weeks really working on diet  Stopped tea and soft drinks  Got sweets out of the house    Family history  Brother is on 4 blood pressure meds      Lab Results  Component Value Date   NA 131 (L) 07/16/2023   K 4.1 07/16/2023   CO2 30 07/16/2023   GLUCOSE 97 07/16/2023   BUN 11 07/16/2023   CREATININE 0.69 07/16/2023   CALCIUM 9.6 07/16/2023   GFR 84.80 07/16/2023   GFRNONAA >60 03/05/2021   Lab Results  Component Value Date   ALT 12 05/13/2023   AST 16 05/13/2023   ALKPHOS 39 05/13/2023   BILITOT 0.6 05/13/2023   Lab Results  Component Value Date   CHOL 181 05/13/2023   HDL 67.60 05/13/2023   LDLCALC 95 05/13/2023   TRIG 93.0 05/13/2023    CHOLHDL 3 05/13/2023   Mood Zoloft 100 mg daily  Buspar 15 mg bid   A lot going on  Brother died - dementia/ got sepsis      11/19/2023   10:01 AM 07/16/2023   11:59 AM 05/20/2023    8:58 AM 12/07/2020   12:41 PM  GAD 7 : Generalized Anxiety Score  Nervous, Anxious, on Edge 1 0 0 3  Control/stop worrying 1 1 1 3   Worry too much - different things 1 1 0 3  Trouble relaxing 1 1 1 3   Restless 1 0 0 3  Easily annoyed or irritable 1 0 0 1  Afraid - awful might happen 1 0 0 3  Total GAD 7 Score 7 3 2 19   Anxiety Difficulty  Not difficult at all Not difficult at all        11/19/2023   10:00 AM 07/16/2023   11:59 AM 05/20/2023    8:58 AM 04/17/2023    2:47 PM 04/09/2022   11:41 AM  Depression screen PHQ 2/9  Decreased Interest 1 1 1  0  0  Down, Depressed, Hopeless 1 0 1 0 0  PHQ - 2 Score 2 1 2  0 0  Altered sleeping 1 1 1  0   Tired, decreased energy 1 1 1  0   Change in appetite 1 0 0 0   Feeling bad or failure about yourself  0 0 0 0   Trouble concentrating 1 0 0 0   Moving slowly or fidgety/restless 1 0 0 0   Suicidal thoughts 0 0 0 0   PHQ-9 Score 7 3 4  0   Difficult doing work/chores  Not difficult at all Not difficult at all Not difficult at all      Patient Active Problem List   Diagnosis Date Noted   Encounter for screening mammogram for breast cancer 05/20/2023   B12 deficiency 05/20/2023   Current use of proton pump inhibitor 05/12/2023   IBS (irritable bowel syndrome) 01/06/2023   Numbness and tingling in both hands 07/05/2022   OSA (obstructive sleep apnea) 10/05/2021   Chest discomfort 08/01/2021   Sleep apnea 01/29/2021   Chronic headaches 12/07/2020   Hyponatremia 04/12/2020   Screening for colon cancer 10/29/2019   Stress reaction 08/31/2019   Screening mammogram for breast cancer 12/08/2017   Estrogen deficiency 12/04/2016   Grief reaction 09/09/2014   Adjustment disorder with depressed mood 09/09/2014   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   Seafood  allergy 04/27/2013   Prediabetes 08/12/2012   Hyperlipidemia 01/01/2011   Hypertension 01/01/2011   GERD (gastroesophageal reflux disease) 01/01/2011   Routine general medical examination at a health care facility 01/01/2011   Vitamin D deficiency disease 01/01/2011   Osteoporosis 01/01/2011   Anxiety 01/01/2011   Allergic rhinitis 01/01/2011   Rosacea 01/01/2011   Disorder of bone and cartilage 01/01/2011   Past Medical History:  Diagnosis Date   Allergy    Anxiety    Arthritis Year ago   Bronchitis    Cataract    Colon polyps    Depression    GERD (gastroesophageal reflux disease)    Heart murmur    History of chicken pox    Hx: UTI (urinary tract infection)    Hyperlipidemia    Hypertension    Migraines    OSA on CPAP    PONV (postoperative nausea and vomiting)    Rosacea    Sleep apnea    uses cpap every night    Past Surgical History:  Procedure Laterality Date   CATARACT EXTRACTION W/PHACO Right 10/01/2023   Procedure: CATARACT EXTRACTION PHACO AND INTRAOCULAR LENS PLACEMENT (IOC) RIGHT 7.97 00:56.0;  Surgeon: Lockie Mola, MD;  Location: [facility];  Service: Ophthalmology;  Laterality: Right;   CATARACT EXTRACTION W/PHACO Left 10/27/2023   Procedure: CATARACT EXTRACTION PHACO AND INTRAOCULAR LENS PLACEMENT (IOC) LEFT 11.32 00:59.6;  Surgeon: Lockie Mola, MD;  Location: [facility];  Service: Ophthalmology;  Laterality: Left;   COLONOSCOPY WITH PROPOFOL N/A 01/25/2021   Procedure: COLONOSCOPY WITH PROPOFOL;  Surgeon: Toney Reil, MD;  Location: [facility];  Service: Gastroenterology;  Laterality: N/A;   COSMETIC SURGERY  1976   DNC     FOOT SURGERY     Social History   Tobacco Use   Smoking status: Former    Current packs/day: 0.00    Average packs/day: 0.5 packs/day for 4.0 years (2.0 ttl pk-yrs)    Types: Cigarettes    Start date: 54    Quit date: 42    Years since quitting: 45.2   Smokeless tobacco: Never  Vaping Use   Vaping status: Never Used  Substance Use Topics   Alcohol use: Not Currently    Comment: wine occasional   Drug use: Never   Family History  Problem Relation Age of Onset   Heart disease Father    Hypertension Father    Early death Father    Diabetes Other    Heart disease Mother    Stroke Paternal Grandmother    Heart disease Paternal Grandfather    Hypertension Paternal Grandfather    Breast cancer Paternal Aunt    Breast cancer Sister    Cancer Sister    Alcohol abuse Paternal Uncle    Anxiety disorder Brother    Depression Brother    Bladder Cancer Neg Hx    Kidney cancer Neg Hx    Allergies  Allergen Reactions   Shellfish Allergy Anaphylaxis    Sob/ swelling   Alendronate     Nausea  Headache    Amlodipine     headache   Thiazide-Type Diuretics     Hyponatremia     Current Outpatient Medications on File Prior to Visit  Medication Sig Dispense Refill   aspirin 81 MG tablet Take 81 mg by mouth daily.     busPIRone (BUSPAR) 15 MG tablet TAKE 1 TABLET BY MOUTH 2 TIMES DAILY. 180 tablet 3   Cholecalciferol (VITAMIN D3) 250 MCG (10000 UT) capsule Take 10,000 Units by mouth daily.     cyanocobalamin (VITAMIN B12) 1000 MCG tablet Take 1,000 mcg by mouth daily.     famotidine (PEPCID) 20 MG tablet TAKE 1 TABLET BY MOUTH EVERYDAY AT BEDTIME 90 tablet 2   loratadine (CLARITIN) 10 MG tablet Take 10 mg by mouth daily.     metroNIDAZOLE (METROGEL) 1 % gel Apply topically 2 (two) times daily. 180 g 3   olmesartan (BENICAR) 40 MG tablet TAKE 1 TABLET BY MOUTH EVERY DAY 90 tablet 2   omeprazole (PRILOSEC) 20 MG capsule TAKE 1 CAPSULE BY MOUTH TWICE A DAY 180 capsule 2   PROLIA 60 MG/ML SOSY injection Inject into the skin. Every 6 months     sertraline (ZOLOFT) 50 MG tablet TAKE 2 TABLETS BY MOUTH EVERY DAY 180 tablet 3   simvastatin (ZOCOR) 20 MG tablet TAKE 1 TABLET BY MOUTH DAILY AT 6 PM. 90 tablet 2   Current Facility-Administered Medications on File Prior  to Visit  Medication Dose Route Frequency Provider Last Rate Last Admin   denosumab (PROLIA) injection 60 mg  60 mg Subcutaneous Once Shrihaan Porzio, Idamae Schuller A, MD       [START ON 02/13/2024] denosumab (PROLIA) injection 60 mg  60 mg Subcutaneous Once Vermell Madrid A, MD        Review of Systems  Constitutional:  Negative for activity change, appetite change, fatigue and unexpected weight change.  HENT:  Negative for congestion, ear pain, rhinorrhea, sinus pressure and sore throat.   Eyes:  Negative for pain, redness and visual disturbance.  Respiratory:  Negative for cough, shortness of breath and wheezing.   Cardiovascular:  Negative for chest pain, palpitations and leg swelling.  Gastrointestinal:  Negative for abdominal pain, blood in stool, constipation and diarrhea.  Endocrine: Negative for polydipsia and polyuria.  Genitourinary:  Negative for dysuria, frequency and urgency.  Musculoskeletal:  Negative for arthralgias, back pain and myalgias.  Skin:  Negative for pallor and rash.  Allergic/Immunologic: Negative for environmental allergies.  Neurological:  Positive for headaches. Negative for dizziness, syncope, facial asymmetry, weakness, light-headedness and numbness.  Hematological:  Negative for adenopathy. Does not bruise/bleed easily.  Psychiatric/Behavioral:  Negative for decreased concentration and dysphoric mood. The patient is not nervous/anxious.        Objective:   Physical Exam        Assessment & Plan:   Problem List Items Addressed This Visit       Cardiovascular and Mediastinum   Hypertension - Primary   BP: (!) 164/82  This has run high at home  Tends to differ in arms/ but can be higher in either arm Reviewed her logs Checked her cuff-running 10 or more points high   Not optimal lifestyle - will work on diet/exercise and handouts given re: DASH and also Mediterranean diet   Continue omesartan 40 mg daily  Intol to amlodipine and metoprolol in past-caused HA   Thiazide diuretics lower na too much  Reviewed last bmet with na of 131 (baseline)  Discussed options Will try spironolactone 25 mg daily (discussed possible side effects incl elevation of K) Follow up 1 wk for visit and labs         Relevant Medications   spironolactone (ALDACTONE) 25 MG tablet     Other   Chronic headaches   Worse with elevated blood pressure Adding spinololactone  Follow up in 1 wk       Anxiety   While this can raise her blood pressure (it is high) , she thinks fairly stable  Some increase stressors (lost her brother), also financial and political  Continues zoloft 100 daily and buspar 15 mg bid   I suspect worse since PHQ and GAD7 scores are higher   Follow up in 1 month

## 2023-11-19 NOTE — Assessment & Plan Note (Signed)
 BP: (!) 164/82  This has run high at home  Tends to differ in arms/ but can be higher in either arm Reviewed her logs Checked her cuff-running 10 or more points high   Not optimal lifestyle - will work on diet/exercise and handouts given re: DASH and also Mediterranean diet   Continue omesartan 40 mg daily  Intol to amlodipine and metoprolol in past-caused HA  Thiazide diuretics lower na too much  Reviewed last bmet with na of 131 (baseline)  Discussed options Will try spironolactone 25 mg daily (discussed possible side effects incl elevation of K) Follow up 1 wk for visit and labs

## 2023-11-19 NOTE — Assessment & Plan Note (Signed)
 While this can raise her blood pressure (it is high) , she thinks fairly stable  Some increase stressors (lost her brother), also financial and political  Continues zoloft 100 daily and buspar 15 mg bid   I suspect worse since PHQ and GAD7 scores are higher   Follow up in 1 month

## 2023-11-19 NOTE — Patient Instructions (Addendum)
 For exercise -work with some videos indoors when you don't have option outdoors  Add some strength training to your routine, this is important for bone and brain health and can reduce your risk of falls and help your body use insulin properly and regulate weight  Light weights, exercise bands , and internet videos are a good way to start  Yoga (chair or regular), machines , floor exercises or a gym with machines are also good options    Try to get most of your carbohydrates from produce (with the exception of white potatoes) and whole grains Eat less bread/pasta/rice/snack foods/cereals/sweets and other items from the middle of the grocery store (processed carbs)  Continue olmesartan 40  Add spironolactone 25 mg each morning  It will make you urinate more   If any intolerable side effect let me know   Follow up in about a week for visit and labs   Take a look at the diet handouts

## 2023-11-26 ENCOUNTER — Encounter: Payer: Self-pay | Admitting: Family Medicine

## 2023-11-26 ENCOUNTER — Ambulatory Visit (INDEPENDENT_AMBULATORY_CARE_PROVIDER_SITE_OTHER): Admitting: Family Medicine

## 2023-11-26 VITALS — BP 159/92 | HR 75 | Temp 98.3°F | Ht 62.0 in | Wt 174.5 lb

## 2023-11-26 DIAGNOSIS — I10 Essential (primary) hypertension: Secondary | ICD-10-CM

## 2023-11-26 MED ORDER — METOPROLOL SUCCINATE ER 50 MG PO TB24
50.0000 mg | ORAL_TABLET | Freq: Every day | ORAL | 1 refills | Status: DC
Start: 2023-11-26 — End: 2024-01-21

## 2023-11-26 NOTE — Progress Notes (Signed)
 Subjective:    Patient ID: Adriana Marks, female    DOB: 05/01/1946, 78 y.o.   MRN: [id number]  HPI  Wt Readings from Last 3 Encounters:  11/26/23 174 lb 8 oz (79.2 kg)  11/19/23 179 lb 4 oz (81.3 kg)  10/27/23 179 lb (81.2 kg)   31.92 kg/m  Vitals:   11/26/23 1356 11/26/23 1415  BP: (!) 172/94 (!) 159/92  Pulse: 75   Temp: 98.3 F (36.8 C)   SpO2: 97%    Pt presents for follow up of HTN   bp is elevated today No cp or palpitations or headaches or edema  No side effects to medicines  BP Readings from Last 3 Encounters:  11/26/23 (!) 159/92  11/19/23 (!) 164/82  10/27/23 (!) 106/95     Last visit blood pressure was up here and at home Taking olmesartan 40 mg daily  Noted intol fo amlodipine and metoprolol (headache side effects) in past  Avoiding thiazides due to hyponatremia  We added spironolactone 25 mg daily   HA is worse with increase blood pressure (better until today)   Checking blood pressure at home- a little lower but not at goal  151/83 last check   No side effects except increased urination     Pulse Readings from Last 3 Encounters:  11/26/23 75  11/19/23 75  10/27/23 66   Wants to try metoprolol    Avoiding sweets  Eating more salad Chicken for protein   Occational snack crisps or potato chips   No soft drinks  No tea No processed meat   Is a bit more anxious today  Needs to stay off her phone    Lab Results  Component Value Date   NA 131 (L) 07/16/2023   K 4.1 07/16/2023   CO2 30 07/16/2023   GLUCOSE 97 07/16/2023   BUN 11 07/16/2023   CREATININE 0.69 07/16/2023   CALCIUM 9.6 07/16/2023   GFR 84.80 07/16/2023   GFRNONAA >60 03/05/2021       Patient Active Problem List   Diagnosis Date Noted   Encounter for screening mammogram for breast cancer 05/20/2023   B12 deficiency 05/20/2023   Current use of proton pump inhibitor 05/12/2023   IBS (irritable bowel syndrome) 01/06/2023   Numbness and tingling in both  hands 07/05/2022   OSA (obstructive sleep apnea) 10/05/2021   Chest discomfort 08/01/2021   Sleep apnea 01/29/2021   Chronic headaches 12/07/2020   Hyponatremia 04/12/2020   Screening for colon cancer 10/29/2019   Stress reaction 08/31/2019   Screening mammogram for breast cancer 12/08/2017   Estrogen deficiency 12/04/2016   Grief reaction 09/09/2014   Adjustment disorder with depressed mood 09/09/2014   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   Prediabetes 08/12/2012   Hyperlipidemia 01/01/2011   Hypertension 01/01/2011   GERD (gastroesophageal reflux disease) 01/01/2011   Routine general medical examination at a health care facility 01/01/2011   Vitamin D deficiency disease 01/01/2011   Osteoporosis 01/01/2011   Anxiety 01/01/2011   Allergic rhinitis 01/01/2011   Rosacea 01/01/2011   Disorder of bone and cartilage 01/01/2011   Past Medical History:  Diagnosis Date   Allergy    Anxiety    Arthritis Year ago   Bronchitis    Cataract    Colon polyps    Depression    GERD (gastroesophageal reflux disease)    Heart murmur    History of chicken pox    Hx: UTI (urinary tract infection)  Hyperlipidemia    Hypertension    Migraines    OSA on CPAP    PONV (postoperative nausea and vomiting)    Rosacea    Sleep apnea    uses cpap every night    Past Surgical History:  Procedure Laterality Date   CATARACT EXTRACTION W/PHACO Right 10/01/2023   Procedure: CATARACT EXTRACTION PHACO AND INTRAOCULAR LENS PLACEMENT (IOC) RIGHT 7.97 00:56.0;  Surgeon: Lockie Mola, MD;  Location: [facility];  Service: Ophthalmology;  Laterality: Right;   CATARACT EXTRACTION W/PHACO Left 10/27/2023   Procedure: CATARACT EXTRACTION PHACO AND INTRAOCULAR LENS PLACEMENT (IOC) LEFT 11.32 00:59.6;  Surgeon: Lockie Mola, MD;  Location: [facility];  Service: Ophthalmology;  Laterality: Left;   COLONOSCOPY WITH PROPOFOL N/A 01/25/2021   Procedure:  COLONOSCOPY WITH PROPOFOL;  Surgeon: Toney Reil, MD;  Location: [facility];  Service: Gastroenterology;  Laterality: N/A;   COSMETIC SURGERY  1976   DNC     FOOT SURGERY     Social History   Tobacco Use   Smoking status: Former    Current packs/day: 0.00    Average packs/day: 0.5 packs/day for 4.0 years (2.0 ttl pk-yrs)    Types: Cigarettes    Start date: 62    Quit date: 29    Years since quitting: 45.2   Smokeless tobacco: Never  Vaping Use   Vaping status: Never Used  Substance Use Topics   Alcohol use: Not Currently    Comment: wine occasional   Drug use: Never   Family History  Problem Relation Age of Onset   Heart disease Father    Hypertension Father    Early death Father    Diabetes Other    Heart disease Mother    Stroke Paternal Grandmother    Heart disease Paternal Grandfather    Hypertension Paternal Grandfather    Breast cancer Paternal Aunt    Breast cancer Sister    Cancer Sister    Alcohol abuse Paternal Uncle    Anxiety disorder Brother    Depression Brother    Bladder Cancer Neg Hx    Kidney cancer Neg Hx    Allergies  Allergen Reactions   Shellfish Allergy Anaphylaxis    Sob/ swelling   Alendronate     Nausea  Headache    Amlodipine     headache   Thiazide-Type Diuretics     Hyponatremia     Current Outpatient Medications on File Prior to Visit  Medication Sig Dispense Refill   aspirin 81 MG tablet Take 81 mg by mouth daily.     busPIRone (BUSPAR) 15 MG tablet TAKE 1 TABLET BY MOUTH 2 TIMES DAILY. 180 tablet 3   Cholecalciferol (VITAMIN D3) 250 MCG (10000 UT) capsule Take 10,000 Units by mouth daily.     cyanocobalamin (VITAMIN B12) 1000 MCG tablet Take 1,000 mcg by mouth daily.     famotidine (PEPCID) 20 MG tablet TAKE 1 TABLET BY MOUTH EVERYDAY AT BEDTIME 90 tablet 2   loratadine (CLARITIN) 10 MG tablet Take 10 mg by mouth daily.     metroNIDAZOLE (METROGEL) 1 % gel Apply topically 2 (two) times daily. 180 g 3    olmesartan (BENICAR) 40 MG tablet TAKE 1 TABLET BY MOUTH EVERY DAY 90 tablet 2   omeprazole (PRILOSEC) 20 MG capsule TAKE 1 CAPSULE BY MOUTH TWICE A DAY 180 capsule 2   PROLIA 60 MG/ML SOSY injection Inject into the skin. Every 6 months     sertraline (ZOLOFT)  50 MG tablet TAKE 2 TABLETS BY MOUTH EVERY DAY 180 tablet 3   simvastatin (ZOCOR) 20 MG tablet TAKE 1 TABLET BY MOUTH DAILY AT 6 PM. 90 tablet 2   Current Facility-Administered Medications on File Prior to Visit  Medication Dose Route Frequency Provider Last Rate Last Admin   denosumab (PROLIA) injection 60 mg  60 mg Subcutaneous Once Amore Ackman, Idamae Schuller A, MD       [START ON 02/13/2024] denosumab (PROLIA) injection 60 mg  60 mg Subcutaneous Once Larson Limones A, MD        Review of Systems  Constitutional:  Negative for activity change, appetite change, fatigue, fever and unexpected weight change.  HENT:  Negative for congestion, ear pain, rhinorrhea, sinus pressure and sore throat.   Eyes:  Negative for pain, redness and visual disturbance.  Respiratory:  Negative for cough, shortness of breath and wheezing.   Cardiovascular:  Negative for chest pain and palpitations.  Gastrointestinal:  Negative for abdominal pain, blood in stool, constipation and diarrhea.  Endocrine: Negative for polydipsia and polyuria.  Genitourinary:  Negative for dysuria, frequency and urgency.  Musculoskeletal:  Negative for arthralgias, back pain and myalgias.  Skin:  Negative for pallor and rash.  Allergic/Immunologic: Negative for environmental allergies.  Neurological:  Positive for headaches. Negative for dizziness, syncope, facial asymmetry, light-headedness and numbness.  Hematological:  Negative for adenopathy. Does not bruise/bleed easily.  Psychiatric/Behavioral:  Negative for decreased concentration and dysphoric mood. The patient is not nervous/anxious.        Objective:   Physical Exam Constitutional:      General: She is not in acute distress.     Appearance: Normal appearance. She is well-developed. She is obese. She is not ill-appearing or diaphoretic.  HENT:     Head: Normocephalic and atraumatic.  Eyes:     Conjunctiva/sclera: Conjunctivae normal.     Pupils: Pupils are equal, round, and reactive to light.  Neck:     Thyroid: No thyromegaly.     Vascular: No carotid bruit or JVD.  Cardiovascular:     Rate and Rhythm: Normal rate and regular rhythm.     Heart sounds: Normal heart sounds.     No gallop.  Pulmonary:     Effort: Pulmonary effort is normal. No respiratory distress.     Breath sounds: Normal breath sounds. No wheezing or rales.  Abdominal:     General: There is no distension or abdominal bruit.     Palpations: Abdomen is soft.  Musculoskeletal:     Cervical back: Normal range of motion and neck supple.     Right lower leg: No edema.     Left lower leg: No edema.  Lymphadenopathy:     Cervical: No cervical adenopathy.  Skin:    General: Skin is warm and dry.     Coloration: Skin is not pale.     Findings: No rash.  Neurological:     Mental Status: She is alert.     Coordination: Coordination normal.     Deep Tendon Reflexes: Reflexes are normal and symmetric. Reflexes normal.  Psychiatric:        Mood and Affect: Mood normal.           Assessment & Plan:   Problem List Items Addressed This Visit       Cardiovascular and Mediastinum   Hypertension - Primary   BP: (!) 159/92   Spironolactone did not help blood pressure much  Headaches improved until today Continues olmesartan  40 mg daily   Wants to try metoprolol again- ? If headache in past but in retrospect may have not been from met Prescription 50 mg xl once daily  Instructed to call if headache or other side effects  Hold spironolactone Follow up 2-3 wk Discussed lifestyle change       Relevant Medications   metoprolol succinate (TOPROL-XL) 50 MG 24 hr tablet

## 2023-11-26 NOTE — Assessment & Plan Note (Signed)
 BP: (!) 159/92   Spironolactone did not help blood pressure much  Headaches improved until today Continues olmesartan 40 mg daily   Wants to try metoprolol again- ? If headache in past but in retrospect may have not been from met Prescription 50 mg xl once daily  Instructed to call if headache or other side effects  Hold spironolactone Follow up 2-3 wk Discussed lifestyle change

## 2023-11-26 NOTE — Patient Instructions (Signed)
 Continue to avoid processed food and excess sodium   Hold the spironolactone for now - we may add it back it later   Start on metoprolol xl 50 mg once daily  If any side effects or worse headaches-hold it and let me know   We may consider referral to our pharmacist if needed    Follow up here in 2-3 weeks

## 2023-12-18 ENCOUNTER — Other Ambulatory Visit: Payer: Self-pay | Admitting: Family Medicine

## 2023-12-19 ENCOUNTER — Encounter: Payer: Self-pay | Admitting: Family Medicine

## 2023-12-20 ENCOUNTER — Other Ambulatory Visit: Payer: Self-pay | Admitting: Family Medicine

## 2023-12-28 ENCOUNTER — Telehealth: Admitting: Physician Assistant

## 2023-12-28 DIAGNOSIS — R0989 Other specified symptoms and signs involving the circulatory and respiratory systems: Secondary | ICD-10-CM

## 2023-12-28 DIAGNOSIS — Z0130 Encounter for examination of blood pressure without abnormal findings: Secondary | ICD-10-CM

## 2023-12-28 NOTE — Progress Notes (Signed)
  Because of your concerns, I feel your condition warrants further evaluation and I recommend that you be seen in a face-to-face visit.   NOTE: There will be NO CHARGE for this E-Visit   If you are having a true medical emergency, please call 911.     For an urgent face to face visit, [place] has multiple urgent care centers for your convenience.  Click the link below for the full list of locations and hours, walk-in wait times, appointment scheduling options and driving directions:  Urgent Care - [place], [place], [place], [place], [place], [place]  [place]     Your MyChart E-visit questionnaire answers were reviewed by a board certified advanced clinical practitioner to complete your personal care plan based on your specific symptoms.    Thank you for using e-Visits.

## 2023-12-29 ENCOUNTER — Telehealth: Admitting: Family Medicine

## 2023-12-29 ENCOUNTER — Ambulatory Visit: Admitting: Family Medicine

## 2024-01-06 ENCOUNTER — Ambulatory Visit: Admitting: Family Medicine

## 2024-01-15 ENCOUNTER — Telehealth: Payer: Self-pay

## 2024-01-15 ENCOUNTER — Other Ambulatory Visit (HOSPITAL_COMMUNITY): Payer: Self-pay

## 2024-01-15 NOTE — Telephone Encounter (Signed)
 Adriana Marks

## 2024-01-15 NOTE — Telephone Encounter (Signed)
 Prolia  VOB initiated via [email or web address]  Next Prolia  inj DUE: 02/13/24

## 2024-01-16 ENCOUNTER — Other Ambulatory Visit (HOSPITAL_COMMUNITY): Payer: Self-pay

## 2024-01-16 NOTE — Telephone Encounter (Signed)
 Pt ready for scheduling for PROLIA  on or after : 02/13/24  Option# 1: Buy/Bill (Office supplied medication)  Out-of-pocket cost due at time of clinic visit: $357  Number of injection/visits approved: 2  Primary: [employer] Prolia  co-insurance: 20% Admin fee co-insurance: 20%  Secondary: --- Prolia  co-insurance:  Admin fee co-insurance:   Medical Benefit Details: Date Benefits were checked: 01/15/24 Deductible: NO/ Coinsurance: 20%/ Admin Fee: 20%  Prior Auth: APPROVED PA# [id number] Expiration Date: 01/16/24-01/15/25   # of doses approved: 2 ----------------------------------------------------------------------- Option# 2- Med Obtained from pharmacy:  Pharmacy benefit: Copay $REFILL TOO SOON (Paid to pharmacy) Admin Fee: 20% - approximately $25  (Pay at clinic)  Prior Auth: N/A PA# Expiration Date:   # of doses approved:   If patient wants fill through the pharmacy benefit please send prescription to: [employer], and include estimated need by date in rx notes. Pharmacy will ship medication directly to the office.  Patient NOT eligible for Prolia  Copay Card. Copay Card can make patient's cost as little as $25. Link to apply: [email or web address]://[email or web address]  ** This summary of benefits is an estimation of the patient's out-of-pocket cost. Exact cost may very based on individual plan coverage.

## 2024-01-16 NOTE — Telephone Encounter (Signed)
 PA submitted via [employer] for buy and bill. Authorization Number : [id number]   Refill too soon for pharmacy benefit. Last filled: 08/08/23. Next fill: 02/04/24

## 2024-01-19 ENCOUNTER — Other Ambulatory Visit: Payer: Self-pay | Admitting: Family Medicine

## 2024-01-20 ENCOUNTER — Other Ambulatory Visit: Payer: Self-pay | Admitting: Family Medicine

## 2024-01-20 NOTE — Telephone Encounter (Signed)
 Will route refill request of prolia  to prolia  pool

## 2024-01-21 ENCOUNTER — Other Ambulatory Visit: Payer: Self-pay | Admitting: Family Medicine

## 2024-01-21 ENCOUNTER — Telehealth: Payer: Self-pay | Admitting: *Deleted

## 2024-01-21 NOTE — Telephone Encounter (Signed)
 We declined spironolactone , it's because PCP d/c med at last OV. Pt advised and is aware she isn't suppose to take med. Said refill request was sent to us  in error because she told pharmacy not to send it

## 2024-01-21 NOTE — Telephone Encounter (Signed)
 Copied from CRM [id number]. Topic: Clinical - Prescription Issue >> Jan 20, 2024  5:03 PM Magdalene School wrote: Reason for CRM: The patient called to report receiving a text message from [facility] asking if she needed a refill for a medication labeled "STI." She responded "yes," but later received a message stating that the refill was denied.  The patient is unsure what "STI" refers to and does not understand why the refill was denied. She is requesting a call back for clarification regarding the name of the medication referred to as "STI", the reason for the denial, and whether any action is needed to resolve the issue  [id number] [place], [place] - [address] [place] [address] Phone: [phone] Fax: [phone]

## 2024-01-22 NOTE — Telephone Encounter (Signed)
 Refill too soon

## 2024-02-02 ENCOUNTER — Other Ambulatory Visit (HOSPITAL_COMMUNITY): Payer: Self-pay

## 2024-02-02 NOTE — Telephone Encounter (Addendum)
      Per test claim: Refill too soon. PA is not needed at this time. Medication was filled 08/08/23. Next eligible fill date is 02/04/24.

## 2024-02-04 ENCOUNTER — Other Ambulatory Visit (HOSPITAL_COMMUNITY): Payer: Self-pay

## 2024-02-04 NOTE — Telephone Encounter (Addendum)
 Pharmacy Patient Advocate Encounter  Insurance verification completed.   The patient is insured through [employer]   Ran test claim for PROLIA . Currently a quantity of 1 ML is a 180 day supply and the co-pay is $645.79 .       This test claim was processed through [facility]- copay amounts may vary at other pharmacies due to pharmacy/plan contracts, or as the patient moves through the different stages of their insurance plan.

## 2024-02-04 NOTE — Telephone Encounter (Signed)
 Spoke with pt and she states that this is not something she can afford anymore.

## 2024-02-04 NOTE — Telephone Encounter (Signed)
 Any coupons/loop holes that you know of for prolia ?    If she stops it her bone density will go down quickly  Thanks !   Adriana Marks pool- sending to pharmacy for advisement

## 2024-02-05 ENCOUNTER — Encounter: Payer: Self-pay | Admitting: Pharmacist

## 2024-02-05 NOTE — Progress Notes (Signed)
 Brief Telephone Documentation CC: Medication Cost - Prolia   Per Test Claim: Buy/Bill (Part B): $375 (20% coinsurance) Pharmacy (Part D): [phone] (of note, this is 20% coinsurance price, NOT high due to medication deductible - meaning cost would stay high)  - Lives in household of 1 with SSI   Medicare LIS Eligible: Possibly Income close to cutoff - patient feels income would be over income is pre-medicare premium. Her monthly after medicare premium each month is just below LIS criteria.   Obejective: Labs: Lab Results  Component Value Date   CALCIUM  9.6 07/16/2023   CALCIUM  9.5 05/13/2023   CALCIUM  9.8 01/06/2023   CREATININE 0.69 07/16/2023   CREATININE 0.79 05/13/2023   CREATININE 0.72 01/06/2023   ALBUMIN 4.3 05/13/2023   ALBUMIN 4.2 05/03/2022   ALBUMIN 4.2 04/10/2021    TSH (uIU/mL)  Date Value  05/13/2023 2.42  05/03/2022 2.96  04/10/2021 2.14  04/05/2020 4.25  03/30/2019 1.86    Radiology:  DEXA: (06/25/22) DENSITOMETRY RESULTS: Site         Region     Measured Date Measured Age WHO Classification Young Adult T-score BMD         %Change vs. Previous Significant Change (*) DualFemur Neck Left 06/25/2024 74.5 Osteopenia -1.2 0.874 g/cm2 -4.9% - DualFemur Neck Left 01/08/2017 69.0 Normal -0.9 0.919 g/cm2 - -   DualFemur Total Mean 06/25/2022 74.5 Normal -1.0 0.888 g/cm2 -2.5% Yes DualFemur Total Mean 01/08/2017 69.0 Normal -0.8 0.911 g/cm2 - -   Left Forearm Radius 33% 06/25/2022 74.5 Osteoporosis -2.5 0.658 g/cm2 - -   Assessment and Plan:   Medication Access Patient is not eligible for copay cards due to government insurance. Patient is not eligible for Patient Assistance through [employer] due to government insurance. [employer] programs are current closed for osteoporosis-related funding. Reviewed [employer] foundation and [employer]  Discussed Medicare LIS with patient. Income close to eligibility cutoff - will reference DHHS site to determine if Net or  Gross SSI is considered   Osteoporosis - Considerations: DEXA up to date: Last 06/25/22 with lowest T-score -2.5 with significant decrease from previous (total hip). Discontinuation of Prolia  not recommended per risk of accelerated bone turnover. Given cost barrier, reasonable to consider alternative therapies as to preserve bone strength on Prolia  discontinuation.  Bisphosphonate - Prior adverse effects on alendronate  (nausea, heaches) - Notably, patient reports that her headaches have persisted despite stopping bisphosphonate use. She is not confident that these were related.  Reasonable to consider alternative bisphosphonate, risedronate (Actonel). (IV ideal for adherence/side effects though concern 20% coinsurance would be too costly). Oral bisphosphonate likely low cost. Renal function great.  PTH analog - Reasonable, though daily injection may be a barrier to compliance for some patients. Ca WNL, no hx gout.  Likely expensive, though Patient Assistance may be an option to provide her with free medication.    Future Appointments  Date Time Provider Department Center  02/10/2024 11:00 AM [name], Manley Seeds, MD [facility]  05/04/2024  3:00 PM [facility] ANNUAL WELLNESS VISIT 1 [facility]  05/14/2024  8:15 AM [facility] LAB [facility]  05/20/2024 10:30 AM [name], Manley Seeds, MD [facility]    Daron Ellen, PharmD Clinical Pharmacist [facility] [phone]

## 2024-02-05 NOTE — Progress Notes (Signed)
 Medicare LIS Application:   Application: Submitted online 02/05/24 Re-entry Number: [id number] Outcome: Via mail to patient's home

## 2024-02-05 NOTE — Telephone Encounter (Signed)
 Thank you :)

## 2024-02-10 ENCOUNTER — Encounter: Payer: Self-pay | Admitting: Family Medicine

## 2024-02-10 ENCOUNTER — Ambulatory Visit (INDEPENDENT_AMBULATORY_CARE_PROVIDER_SITE_OTHER): Admitting: Family Medicine

## 2024-02-10 ENCOUNTER — Ambulatory Visit: Admitting: Family Medicine

## 2024-02-10 VITALS — BP 168/90 | HR 71 | Temp 98.7°F | Ht 62.0 in | Wt 174.0 lb

## 2024-02-10 DIAGNOSIS — I10 Essential (primary) hypertension: Secondary | ICD-10-CM | POA: Diagnosis not present

## 2024-02-10 DIAGNOSIS — R011 Cardiac murmur, unspecified: Secondary | ICD-10-CM | POA: Diagnosis not present

## 2024-02-10 DIAGNOSIS — R002 Palpitations: Secondary | ICD-10-CM | POA: Diagnosis not present

## 2024-02-10 NOTE — Progress Notes (Signed)
 Subjective:    Patient ID: Adriana Marks, female    DOB: 02/08/1946, 78 y.o.   MRN: [id number]  HPI  Wt Readings from Last 3 Encounters:  02/10/24 174 lb (78.9 kg)  11/26/23 174 lb 8 oz (79.2 kg)  11/19/23 179 lb 4 oz (81.3 kg)   31.83 kg/m  Vitals:   02/10/24 1044 02/10/24 1118  BP: (!) 170/90 (!) 168/90  Pulse: 71   Temp: 98.7 F (37.1 C)   SpO2: 96%    Pt presents for follow up of HTN    HTN  No cp or palpitations or headaches or edema  No side effects to medicines  BP Readings from Last 3 Encounters:  02/10/24 (!) 168/90  11/26/23 (!) 159/92  11/19/23 (!) 164/82     Olmesartan  40 mg daily  Tried spironolactone  and it did not help blood pressure much   Last visit added metoprolol  xl 50 mg daily  Tolerating so far   Blood pressure at home  158/78 last night  As low as 120s/70 depending on the day    Has noticed a little flutter as well   ? History of M Wore a monitor in past  Echo and stress test in 2012    In past  Thiazides cause worsened hyponatremia  Amlodipine  caused headaches   Pulse Readings from Last 3 Encounters:  02/10/24 71  11/26/23 75  11/19/23 75     Lab Results  Component Value Date   NA 131 (L) 07/16/2023   K 4.1 07/16/2023   CO2 30 07/16/2023   GLUCOSE 97 07/16/2023   BUN 11 07/16/2023   CREATININE 0.69 07/16/2023   CALCIUM  9.6 07/16/2023   GFR 84.80 07/16/2023   GFRNONAA >60 03/05/2021    Is eating well  Avoiding processed foods   Father died of MI at 34  Lab Results  Component Value Date   CHOL 181 05/13/2023   HDL 67.60 05/13/2023   LDLCALC 95 05/13/2023   TRIG 93.0 05/13/2023   CHOLHDL 3 05/13/2023       Patient Active Problem List   Diagnosis Date Noted   Heart murmur 02/10/2024   Palpitations 02/10/2024   Encounter for screening mammogram for breast cancer 05/20/2023   B12 deficiency 05/20/2023   Current use of proton pump inhibitor 05/12/2023   IBS (irritable bowel syndrome) 01/06/2023    Numbness and tingling in both hands 07/05/2022   OSA (obstructive sleep apnea) 10/05/2021   Chest discomfort 08/01/2021   Sleep apnea 01/29/2021   Chronic headaches 12/07/2020   Hyponatremia 04/12/2020   Screening for colon cancer 10/29/2019   Stress reaction 08/31/2019   Screening mammogram for breast cancer 12/08/2017   Estrogen deficiency 12/04/2016   Grief reaction 09/09/2014   Adjustment disorder with depressed mood 09/09/2014   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   Prediabetes 08/12/2012   Hyperlipidemia 01/01/2011   Hypertension 01/01/2011   GERD (gastroesophageal reflux disease) 01/01/2011   Routine general medical examination at a health care facility 01/01/2011   Vitamin D  deficiency disease 01/01/2011   Osteoporosis 01/01/2011   Anxiety 01/01/2011   Allergic rhinitis 01/01/2011   Rosacea 01/01/2011   Disorder of bone and cartilage 01/01/2011   Past Medical History:  Diagnosis Date   Allergy    Anxiety    Arthritis Year ago   Bronchitis    Cataract    Colon polyps    Depression    GERD (gastroesophageal reflux disease)  Heart murmur    History of chicken pox    Hx: UTI (urinary tract infection)    Hyperlipidemia    Hypertension    Migraines    OSA on CPAP    Oxygen deficiency    PONV (postoperative nausea and vomiting)    Rosacea    Sleep apnea    uses cpap every night    Past Surgical History:  Procedure Laterality Date   CATARACT EXTRACTION W/PHACO Right 10/01/2023   Procedure: CATARACT EXTRACTION PHACO AND INTRAOCULAR LENS PLACEMENT (IOC) RIGHT 7.97 00:56.0;  Surgeon: Annell Kidney, MD;  Location: [facility];  Service: Ophthalmology;  Laterality: Right;   CATARACT EXTRACTION W/PHACO Left 10/27/2023   Procedure: CATARACT EXTRACTION PHACO AND INTRAOCULAR LENS PLACEMENT (IOC) LEFT 11.32 00:59.6;  Surgeon: Annell Kidney, MD;  Location: [facility];  Service: Ophthalmology;  Laterality: Left;    COLONOSCOPY WITH PROPOFOL  N/A 01/25/2021   Procedure: COLONOSCOPY WITH PROPOFOL ;  Surgeon: Selena Daily, MD;  Location: [facility];  Service: Gastroenterology;  Laterality: N/A;   COSMETIC SURGERY  1976   DNC     FOOT SURGERY     Social History   Tobacco Use   Smoking status: Former    Current packs/day: 0.00    Average packs/day: 0.5 packs/day for 4.0 years (2.0 ttl pk-yrs)    Types: Cigarettes    Start date: 44    Quit date: 16    Years since quitting: 45.4   Smokeless tobacco: Never  Vaping Use   Vaping status: Never Used  Substance Use Topics   Alcohol use: Not Currently    Comment: wine occasional   Drug use: Never   Family History  Problem Relation Age of Onset   Heart disease Father    Hypertension Father    Early death Father    Diabetes Other    Heart disease Mother    Stroke Paternal Grandmother    Heart disease Paternal Grandfather    Hypertension Paternal Grandfather    Breast cancer Paternal Aunt    Breast cancer Sister    Cancer Sister    Alcohol abuse Paternal Uncle    Anxiety disorder Brother    Depression Brother    Bladder Cancer Neg Hx    Kidney cancer Neg Hx    Allergies  Allergen Reactions   Shellfish Allergy Anaphylaxis    Sob/ swelling   Alendronate      Nausea  Headache    Amlodipine      headache   Thiazide-Type Diuretics     Hyponatremia     Current Outpatient Medications on File Prior to Visit  Medication Sig Dispense Refill   aspirin 81 MG tablet Take 81 mg by mouth daily.     busPIRone  (BUSPAR ) 15 MG tablet TAKE 1 TABLET BY MOUTH 2 TIMES DAILY. 180 tablet 3   Cholecalciferol (VITAMIN D3) 250 MCG (10000 UT) capsule Take 10,000 Units by mouth daily.     cyanocobalamin  (VITAMIN B12) 1000 MCG tablet Take 1,000 mcg by mouth daily.     Docusate Calcium  (STOOL SOFTENER PO) Take 1 capsule by mouth daily.     famotidine  (PEPCID ) 20 MG tablet TAKE 1 TABLET BY MOUTH EVERYDAY AT BEDTIME 90 tablet 2   loratadine (CLARITIN)  10 MG tablet Take 10 mg by mouth daily.     metoprolol  succinate (TOPROL -XL) 50 MG 24 hr tablet TAKE 1 TABLET BY MOUTH DAILY. TAKE WITH OR IMMEDIATELY FOLLOWING A MEAL. 90 tablet 0   metroNIDAZOLE  (METROGEL ) 1 %  gel Apply topically 2 (two) times daily. 180 g 3   olmesartan  (BENICAR ) 40 MG tablet TAKE 1 TABLET BY MOUTH EVERY DAY 90 tablet 2   omeprazole  (PRILOSEC) 20 MG capsule TAKE 1 CAPSULE BY MOUTH TWICE A DAY 180 capsule 0   PROLIA  60 MG/ML SOSY injection Inject into the skin. Every 6 months     sertraline  (ZOLOFT ) 50 MG tablet TAKE 2 TABLETS BY MOUTH EVERY DAY 180 tablet 3   simvastatin  (ZOCOR ) 20 MG tablet TAKE 1 TABLET BY MOUTH DAILY AT 6 PM. 90 tablet 2   Current Facility-Administered Medications on File Prior to Visit  Medication Dose Route Frequency Provider Last Rate Last Admin   denosumab  (PROLIA ) injection 60 mg  60 mg Subcutaneous Once Shiva Karis, Mitchell Ana A, MD       [START ON 02/13/2024] denosumab  (PROLIA ) injection 60 mg  60 mg Subcutaneous Once Emeri Estill A, MD        Review of Systems  Constitutional:  Negative for activity change, appetite change, fatigue, fever and unexpected weight change.  HENT:  Negative for congestion, ear pain, rhinorrhea, sinus pressure and sore throat.   Eyes:  Negative for pain, redness and visual disturbance.  Respiratory:  Negative for cough, shortness of breath and wheezing.   Cardiovascular:  Positive for palpitations. Negative for chest pain and leg swelling.  Gastrointestinal:  Negative for abdominal pain, blood in stool, constipation and diarrhea.       Some fleeting stinging sensation in left lower abd once in a while  More than 6 months     Endocrine: Negative for polydipsia and polyuria.  Genitourinary:  Negative for dysuria, frequency and urgency.  Musculoskeletal:  Negative for arthralgias, back pain and myalgias.  Skin:  Negative for pallor and rash.  Allergic/Immunologic: Negative for environmental allergies.  Neurological:  Positive  for headaches. Negative for dizziness and syncope.  Hematological:  Negative for adenopathy. Does not bruise/bleed easily.  Psychiatric/Behavioral:  Negative for decreased concentration and dysphoric mood. The patient is not nervous/anxious.        Objective:   Physical Exam Constitutional:      General: She is not in acute distress.    Appearance: Normal appearance. She is well-developed. She is obese. She is not ill-appearing or diaphoretic.  HENT:     Head: Normocephalic and atraumatic.   Eyes:     Conjunctiva/sclera: Conjunctivae normal.     Pupils: Pupils are equal, round, and reactive to light.   Neck:     Thyroid : No thyromegaly.     Vascular: No carotid bruit or JVD.   Cardiovascular:     Rate and Rhythm: Normal rate and regular rhythm.     Heart sounds: Murmur heard.     No gallop.  Pulmonary:     Effort: Pulmonary effort is normal. No respiratory distress.     Breath sounds: Normal breath sounds. No wheezing or rales.  Abdominal:     General: There is no distension or abdominal bruit.     Palpations: Abdomen is soft.   Musculoskeletal:     Cervical back: Normal range of motion and neck supple.     Right lower leg: No edema.     Left lower leg: No edema.  Lymphadenopathy:     Cervical: No cervical adenopathy.   Skin:    General: Skin is warm and dry.     Coloration: Skin is not pale.     Findings: No rash.   Neurological:  Mental Status: She is alert.     Coordination: Coordination normal.     Deep Tendon Reflexes: Reflexes are normal and symmetric. Reflexes normal.   Psychiatric:        Mood and Affect: Mood normal.           Assessment & Plan:   Problem List Items Addressed This Visit       Cardiovascular and Mediastinum   Hypertension - Primary   Struggling to control blood pressure  BP: (!) 168/90   Most recent labs reviewed  Disc lifstyle change with low sodium diet and exercise   Taking olemesartan 40 mg daily  Metoprolol  xl  50 mg daily  Not much improvement   Unable to use thiazide due to worsening hyponatremia Intol of amlodipine  -causes headache Spironolactone  did not help blood pressure   No symptoms except occational fleeting heart flutter  Headache on /off   Last cardiac work up 2012 Will refer to cardiology for help with management       Relevant Orders   Ambulatory referral to Cardiology     Other   Palpitations   Occational fluttering feeling Normal exam Baseline heart M On beta blocker  Encouraged to stop caffeine   Ref to cardiology for this and help with HTN management   Call back and Er precautions noted in detail today    Last cardiac work up with echo and stress testing and monitor was 2012         Heart murmur   No change on exam      Relevant Orders   Ambulatory referral to Cardiology

## 2024-02-10 NOTE — Assessment & Plan Note (Signed)
 Struggling to control blood pressure  BP: (!) 168/90   Most recent labs reviewed  Disc lifstyle change with low sodium diet and exercise   Taking olemesartan 40 mg daily  Metoprolol  xl 50 mg daily  Not much improvement   Unable to use thiazide due to worsening hyponatremia Intol of amlodipine  -causes headache Spironolactone  did not help blood pressure   No symptoms except occational fleeting heart flutter  Headache on /off   Last cardiac work up 2012 Will refer to cardiology for help with management

## 2024-02-10 NOTE — Patient Instructions (Signed)
 Avoid caffeine as much as possible  Avoid processed foods as much as possible   Blood pressure is still not optimal   Continue current medicines    I put the referral in for cardiology to help with blood pressure management and palpitations  Please let us  know if you don't hear in 1-2 weeks to set that up

## 2024-02-10 NOTE — Assessment & Plan Note (Signed)
No change on exam

## 2024-02-10 NOTE — Assessment & Plan Note (Signed)
 Occational fluttering feeling Normal exam Baseline heart M On beta blocker  Encouraged to stop caffeine   Ref to cardiology for this and help with HTN management   Call back and Er precautions noted in detail today    Last cardiac work up with echo and stress testing and monitor was 2012

## 2024-02-14 ENCOUNTER — Other Ambulatory Visit: Payer: Self-pay | Admitting: Family Medicine

## 2024-03-05 ENCOUNTER — Other Ambulatory Visit: Payer: Self-pay | Admitting: Family Medicine

## 2024-03-10 ENCOUNTER — Encounter: Payer: Self-pay | Admitting: Internal Medicine

## 2024-03-10 ENCOUNTER — Ambulatory Visit: Attending: Internal Medicine | Admitting: Internal Medicine

## 2024-03-10 VITALS — BP 154/90 | HR 67 | Ht 62.5 in | Wt 173.4 lb

## 2024-03-10 DIAGNOSIS — R011 Cardiac murmur, unspecified: Secondary | ICD-10-CM | POA: Diagnosis not present

## 2024-03-10 DIAGNOSIS — R0609 Other forms of dyspnea: Secondary | ICD-10-CM

## 2024-03-10 DIAGNOSIS — I10 Essential (primary) hypertension: Secondary | ICD-10-CM

## 2024-03-10 MED ORDER — NEBIVOLOL HCL 10 MG PO TABS: 10.0000 mg | ORAL_TABLET | Freq: Every day | ORAL | 3 refills | Status: DC

## 2024-03-10 NOTE — Progress Notes (Unsigned)
 Cardiology Office Note:  .   Date:  03/11/2024  ID:  Adriana Marks, DOB [birth year], MRN [id number] PCP: Randeen Laine LABOR, MD  [facility] Providers Cardiologist:  New Click to update primary MD,subspecialty MD or APP then REFRESH:1}    History of Present Illness: .   Adriana Marks is a 78 y.o. female with history of hypertension, hyperlipidemia, heart murmur, GERD, obstructive sleep apnea, migraine headaches, and depression/anxiety, who has been referred for evaluation of hypertension and heart murmur.  Adriana Marks reports that she was diagnosed with hypertension in her 30s and has struggled to maintain good blood pressure control for many years.  She attributes some of this to stress following her husband's death in 03-16-13.  She has also been intolerant of several medicines, including amlodipine  (lower extremity edema) and hydrochlorothiazide  (hyponatremia).  She has also experienced headaches with metoprolol  but is tolerating the medication reasonably well at this time.  She notes that her blood pressures at home are somewhat labile, with readings as low as 110/67 when relaxing at home.  Her morning readings tend to be quite a bit higher.  She has obstructive sleep apnea and is compliant with her CPAP.  Overall, Adriana Marks feels fairly well.  She has mild exertional dyspnea when walking extended distances, such as across our parking lot today.  She also has sporadic palpitations that she describes as a flutter that takes her breath away.  This only lasts a few seconds at a time and happens sporadically.  She has not had any chest pain or edema.  She sometimes feels a little dizzy when hungry but has not had any frank lightheadedness or syncope.  ROS: See HPI  Studies Reviewed: SABRA   EKG Interpretation Date/Time:  Wednesday March 10 2024 13:59:59 EDT Ventricular Rate:  67 PR Interval:  176 QRS Duration:  78 QT Interval:  412 QTC Calculation: 435 R Axis:   -7  Text  Interpretation: Normal sinus rhythm Normal ECG When compared with ECG of 05-Mar-2021 12:20, No significant change was found Confirmed by Solara Goodchild, Lonni [phone]) on 03/11/2024 7:25:49 AM    Exercise MPI (11/20/2010): Low risk study without evidence of ischemia or scar.  Breast attenuation noted.  Normal LVEF.  Risk Assessment/Calculations:     HYPERTENSION CONTROL Vitals:   03/10/24 1356 03/10/24 1404 03/10/24 1432  BP: (!) 162/90 (!) 180/92 (!) 154/90    The patient's blood pressure is elevated above target today.  In order to address the patient's elevated BP: A new medication was prescribed today.          Physical Exam:   VS:  BP (!) 154/90 (BP Location: Left Arm, Cuff Size: Normal)   Pulse 67   Ht 5' 2.5 (1.588 m)   Wt 173 lb 6.4 oz (78.7 kg)   LMP 08/26/1997   SpO2 98%   BMI 31.21 kg/m    Wt Readings from Last 3 Encounters:  03/10/24 173 lb 6.4 oz (78.7 kg)  02/10/24 174 lb (78.9 kg)  11/26/23 174 lb 8 oz (79.2 kg)    General:  NAD. Neck: No JVD or HJR. Lungs: Clear to auscultation bilaterally without wheezes or crackles. Heart: Regular rate and rhythm with 2/6 systolic murmur.  No rubs or gallops. Abdomen: Soft, nontender, nondistended. Extremities: No lower extremity edema.  ASSESSMENT AND PLAN: .    Uncontrolled hypertension: Blood pressure quite elevated today with difficulty controlling this for several years.  Lability of Adriana Marks' blood pressure as well  as multiple medication intolerances may limit our ability to control her blood pressure.  She is already on maximum dose of olmesartan .  We have agreed to switch from metoprolol  to nebivolol  10 mg daily to see if this offers some improvement in blood pressure control.  We will also obtain a renal artery Doppler and serum aldosterone/plasma renin activity ratio to screen for secondary hypertension.  Importance of sodium restriction was reinforced.  If blood pressure remains difficult to control, referral to our  hypertension clinic in [place] may need to be considered in the future.  Continue CPAP use for OSA.  Heart murmur and dyspnea on exertion: Systolic murmur appreciated on exam today.  Adriana Marks appears euvolemic on exam but notes some exertional dyspnea when walking extended distances.  I suspect that she has some degree of hypertensive heart disease, given her longstanding and suboptimally controlled hypertension.  We have agreed to obtain an echocardiogram at her convenience.    Dispo: Return to clinic in 4-6 weeks.  Signed, Lonni Hanson, MD

## 2024-03-10 NOTE — Patient Instructions (Addendum)
 Medication Instructions:  Your physician recommends the following medication changes.  STOP TAKING: Metoprolol   START TAKING: Bystolic  10 mg by mouth daily  *If you need a refill on your cardiac medications before your next appointment, please call your pharmacy*  Lab Work: Your provider would like for you to return to have the following labs drawn: BMP, Aldosterone Renin (Please have labs completed in the morning)  Please go to [facility] You do not need an appointment.    Testing/Procedures: Your physician has requested that you have an echocardiogram. Echocardiography is a painless test that uses sound waves to create images of your heart. It provides your doctor with information about the size and shape of your heart and how well your heart's chambers and valves are working.   You may receive an ultrasound enhancing agent through an IV if needed to better visualize your heart during the echo. This procedure takes approximately one hour.  There are no restrictions for this procedure.  This will take place at [address] [facility]) [address], [place] [address]  Please note: We ask at that you not bring children with you during ultrasound (echo/ vascular) testing. Due to room size and safety concerns, children are not allowed in the ultrasound rooms during exams. Our front office staff cannot provide observation of children in our lobby area while testing is being conducted. An adult accompanying a patient to their appointment will only be allowed in the ultrasound room at the discretion of the ultrasound technician under special circumstances. We apologize for any inconvenience.   Your physician has requested that you have a renal artery duplex. During this test, an ultrasound is used to evaluate blood flow to the kidneys. Take your medications as you usually do. This will take place at [address] [facility]) [address], [place] [address].  No  food after 11PM the night before.  Water is OK. (Don't drink liquids if you have been instructed not to for ANOTHER test). Avoid foods that produce bowel gas, for 24 hours prior to exam (see below). No breakfast, no chewing gum, no smoking or carbonated beverages. Patient may take morning medications with water. Come in for test at least 15 minutes early to register.  Please note: We ask at that you not bring children with you during ultrasound (echo/ vascular) testing. Due to room size and safety concerns, children are not allowed in the ultrasound rooms during exams. Our front office staff cannot provide observation of children in our lobby area while testing is being conducted. An adult accompanying a patient to their appointment will only be allowed in the ultrasound room at the discretion of the ultrasound technician under special circumstances. We apologize for any inconvenience.   Follow-Up: At [facility], you and your health needs are our priority.  As part of our continuing mission to provide you with exceptional heart care, our providers are all part of one team.  This team includes your primary Cardiologist (physician) and Advanced Practice Providers or APPs (Physician Assistants and Nurse Practitioners) who all work together to provide you with the care you need, when you need it.  Your next appointment:   4-6 week(s)  Provider:   You may see Lonni Hanson, MD or one of the following Advanced Practice Providers on your designated Care Team:   Lonni Meager, NP Lesley Maffucci, PA-C Bernardino Bring, PA-C Cadence Lyons, PA-C Tylene Lunch, NP Barnie Hila, NP

## 2024-03-11 ENCOUNTER — Encounter: Payer: Self-pay | Admitting: Internal Medicine

## 2024-03-11 DIAGNOSIS — R0609 Other forms of dyspnea: Secondary | ICD-10-CM | POA: Insufficient documentation

## 2024-03-12 ENCOUNTER — Other Ambulatory Visit
Admission: RE | Admit: 2024-03-12 | Discharge: 2024-03-12 | Disposition: A | Discharge status: Home / Self Care | Attending: Internal Medicine | Admitting: Internal Medicine

## 2024-03-12 ENCOUNTER — Ambulatory Visit: Payer: Self-pay

## 2024-03-12 ENCOUNTER — Encounter: Payer: Self-pay | Admitting: Internal Medicine

## 2024-03-12 DIAGNOSIS — I10 Essential (primary) hypertension: Secondary | ICD-10-CM | POA: Insufficient documentation

## 2024-03-12 LAB — BASIC METABOLIC PANEL WITH GFR
Anion gap: 10 (ref 5–15)
BUN: 12 mg/dL (ref 8–23)
CO2: 24 mmol/L (ref 22–32)
Calcium: 9.7 mg/dL (ref 8.9–10.3)
Chloride: 99 mmol/L (ref 98–111)
Creatinine, Ser: 0.78 mg/dL (ref 0.44–1.00)
GFR, Estimated: >60 mL/min (ref >60)
Glucose, Bld: 107 mg/dL — ABNORMAL HIGH (ref 70–99)
Potassium: 4 mmol/L (ref 3.5–5.1)
Sodium: 133 mmol/L — ABNORMAL LOW (ref 135–145)

## 2024-03-15 LAB — ALDOSTERONE + RENIN ACTIVITY W/ RATIO
ALDO / PRA Ratio: 1.3 (ref 0.0–30.0)
Aldosterone: 2.2 ng/dL (ref 0.0–30.0)
PRA LC/MS/MS: 1.714 ng/mL/h (ref 0.167–5.380)

## 2024-03-21 ENCOUNTER — Other Ambulatory Visit: Payer: Self-pay | Admitting: Family Medicine

## 2024-03-22 ENCOUNTER — Ambulatory Visit: Attending: Internal Medicine

## 2024-03-22 DIAGNOSIS — I10 Essential (primary) hypertension: Secondary | ICD-10-CM

## 2024-03-25 ENCOUNTER — Encounter: Payer: Self-pay | Admitting: Internal Medicine

## 2024-04-21 ENCOUNTER — Ambulatory Visit: Attending: Internal Medicine

## 2024-04-21 DIAGNOSIS — I080 Rheumatic disorders of both mitral and aortic valves: Secondary | ICD-10-CM | POA: Diagnosis not present

## 2024-04-21 DIAGNOSIS — I5030 Unspecified diastolic (congestive) heart failure: Secondary | ICD-10-CM | POA: Diagnosis not present

## 2024-04-21 DIAGNOSIS — R0609 Other forms of dyspnea: Secondary | ICD-10-CM

## 2024-04-21 DIAGNOSIS — R011 Cardiac murmur, unspecified: Secondary | ICD-10-CM | POA: Diagnosis not present

## 2024-04-21 LAB — ECHOCARDIOGRAM COMPLETE
AR max vel: 1.39 cm2
AV Area VTI: 1.33 cm2
AV Area mean vel: 1.38 cm2
AV Mean grad: 6 mmHg
AV Peak grad: 11.3 mmHg
Ao pk vel: 1.68 m/s
Area-P 1/2: 2.99 cm2
S' Lateral: 3.31 cm

## 2024-05-04 ENCOUNTER — Ambulatory Visit (INDEPENDENT_AMBULATORY_CARE_PROVIDER_SITE_OTHER): Payer: Medicare HMO

## 2024-05-04 VITALS — Ht 62.5 in | Wt 168.0 lb

## 2024-05-04 DIAGNOSIS — Z0000 Encounter for general adult medical examination without abnormal findings: Secondary | ICD-10-CM

## 2024-05-04 NOTE — Patient Instructions (Signed)
 Adriana Marks,  Thank you for taking the time for your Medicare Wellness Visit. I appreciate your continued commitment to your health goals. Please review the care plan we discussed, and feel free to reach out if I can assist you further.  Medicare recommends these wellness visits once per year to help you and your care team stay ahead of potential health issues. These visits are designed to focus on prevention, allowing your provider to concentrate on managing your acute and chronic conditions during your regular appointments.  Please note that Annual Wellness Visits do not include a physical exam. Some assessments may be limited, especially if the visit was conducted virtually. If needed, we may recommend a separate in-person follow-up with your provider.  Ongoing Care Seeing your primary care provider every 3 to 6 months helps us  monitor your health and provide consistent, personalized care. 05/20/24 Physical  Referrals If a referral was made during today's visit and you haven't received any updates within two weeks, please contact the referred provider directly to check on the status.  Recommended Screenings:  Health Maintenance  Topic Date Due   Flu Shot  03/26/2024   COVID-19 Vaccine (5 - 2025-26 season) 04/26/2024   Mammogram  06/30/2024   Medicare Annual Wellness Visit  05/04/2025   DTaP/Tdap/Td vaccine (3 - Td or Tdap) 06/15/2032   Pneumococcal Vaccine for age over 11  Completed   DEXA scan (bone density measurement)  Completed   Hepatitis C Screening  Completed   Zoster (Shingles) Vaccine  Completed   HPV Vaccine  Aged Out   Meningitis B Vaccine  Aged Out   Colon Cancer Screening  Discontinued       10/27/2023    8:24 AM  Advanced Directives  Does Patient Have a Medical Advance Directive? Yes  Type of Advance Directive Living will  Does patient want to make changes to medical advance directive? No - Patient declined   Advance Care Planning is important because it: Ensures  you receive medical care that aligns with your values, goals, and preferences. Provides guidance to your family and loved ones, reducing the emotional burden of decision-making during critical moments.  Vision: Annual vision screenings are recommended for early detection of glaucoma, cataracts, and diabetic retinopathy. These exams can also reveal signs of chronic conditions such as diabetes and high blood pressure.  Dental: Annual dental screenings help detect early signs of oral cancer, gum disease, and other conditions linked to overall health, including heart disease and diabetes.

## 2024-05-04 NOTE — Progress Notes (Signed)
 Subjective:   Adriana Marks is a 78 y.o. who presents for a Medicare Wellness preventive visit.  As a reminder, Annual Wellness Visits don't include a physical exam, and some assessments may be limited, especially if this visit is performed virtually. We may recommend an in-person follow-up visit with your provider if needed.  Visit Complete: Virtual I connected with  Adriana Marks on 05/04/24 by a audio enabled telemedicine application and verified that I am speaking with the correct person using two identifiers.  Patient Location: Home  Provider Location: Home Office  I discussed the limitations of evaluation and management by telemedicine. The patient expressed understanding and agreed to proceed.  Vital Signs: Because this visit was a virtual/telehealth visit, some criteria may be missing or patient reported. Any vitals not documented were not able to be obtained and vitals that have been documented are patient reported.  VideoDeclined- This patient declined Librarian, academic. Therefore the visit was completed with audio only.  Persons Participating in Visit: Patient.  AWV Questionnaire: Yes: Patient Medicare AWV questionnaire was completed by the patient on 05/03/24; I have confirmed that all information answered by patient is correct and no changes since this date.  Cardiac Risk Factors include: advanced age (>39men, >33 women);dyslipidemia;hypertension;obesity (BMI >30kg/m2)    Objective:    Today's Vitals   05/04/24 1502  Weight: 168 lb (76.2 kg)  Height: 5' 2.5 (1.588 m)   Body mass index is 30.24 kg/m.     05/04/2024    3:13 PM 10/27/2023    8:24 AM 10/01/2023   10:30 AM 04/17/2023    2:46 PM 04/09/2022   11:35 AM 03/05/2021   11:56 AM 01/25/2021    7:39 AM  Advanced Directives  Does Patient Have a Medical Advance Directive? Yes Yes Yes Yes Yes No Yes  Type of Estate agent of [place];Living will Living will Living  will Healthcare Power of [place];Living will Healthcare Power of Attorney    Does patient want to make changes to medical advance directive?  No - Patient declined No - Patient declined      Copy of Healthcare Power of Attorney in Chart? No - copy requested   No - copy requested Yes - validated most recent copy scanned in chart (See row information)      Current Medications (verified) Outpatient Encounter Medications as of 05/04/2024  Medication Sig   aspirin 81 MG tablet Take 81 mg by mouth daily.   busPIRone  (BUSPAR ) 15 MG tablet TAKE 1 TABLET BY MOUTH 2 TIMES DAILY.   Cholecalciferol (VITAMIN D3) 250 MCG (10000 UT) capsule Take 10,000 Units by mouth daily.   cyanocobalamin  (VITAMIN B12) 1000 MCG tablet Take 1,000 mcg by mouth daily.   Docusate Calcium  (STOOL SOFTENER PO) Take 1 capsule by mouth daily.   famotidine  (PEPCID ) 20 MG tablet TAKE 1 TABLET BY MOUTH EVERYDAY AT BEDTIME   loratadine (CLARITIN) 10 MG tablet Take 10 mg by mouth daily.   metroNIDAZOLE  (METROGEL ) 1 % gel Apply topically 2 (two) times daily.   nebivolol  (BYSTOLIC ) 10 MG tablet Take 1 tablet (10 mg total) by mouth daily.   olmesartan  (BENICAR ) 40 MG tablet TAKE 1 TABLET BY MOUTH EVERY DAY   omeprazole  (PRILOSEC) 20 MG capsule TAKE 1 CAPSULE BY MOUTH TWICE A DAY   sertraline  (ZOLOFT ) 50 MG tablet TAKE 2 TABLETS BY MOUTH EVERY DAY   simvastatin  (ZOCOR ) 20 MG tablet TAKE 1 TABLET BY MOUTH DAILY AT 6 PM.  PROLIA  60 MG/ML SOSY injection Inject into the skin. Every 6 months   Facility-Administered Encounter Medications as of 05/04/2024  Medication   denosumab  (PROLIA ) injection 60 mg   denosumab  (PROLIA ) injection 60 mg    Allergies (verified) Shellfish allergy, Alendronate , Amlodipine , and Thiazide-type diuretics   History: Past Medical History:  Diagnosis Date   Allergy    Anxiety    Arthritis Year ago   Bronchitis    Cataract    Colon polyps    Depression    GERD (gastroesophageal reflux disease)    Heart  murmur    History of chicken pox    Hx: UTI (urinary tract infection)    Hyperlipidemia    Hypertension    Migraines    OSA on CPAP    Oxygen deficiency    PONV (postoperative nausea and vomiting)    Rosacea    Sleep apnea    uses cpap every night    Past Surgical History:  Procedure Laterality Date   CATARACT EXTRACTION W/PHACO Right 10/01/2023   Procedure: CATARACT EXTRACTION PHACO AND INTRAOCULAR LENS PLACEMENT (IOC) RIGHT 7.97 00:56.0;  Surgeon: Mittie Gaskin, MD;  Location: [facility];  Service: Ophthalmology;  Laterality: Right;   CATARACT EXTRACTION W/PHACO Left 10/27/2023   Procedure: CATARACT EXTRACTION PHACO AND INTRAOCULAR LENS PLACEMENT (IOC) LEFT 11.32 00:59.6;  Surgeon: Mittie Gaskin, MD;  Location: [facility];  Service: Ophthalmology;  Laterality: Left;   COLONOSCOPY WITH PROPOFOL  N/A 01/25/2021   Procedure: COLONOSCOPY WITH PROPOFOL ;  Surgeon: Unk Corinn Skiff, MD;  Location: [facility];  Service: Gastroenterology;  Laterality: N/A;   COSMETIC SURGERY  1976   DNC     FOOT SURGERY     Family History  Problem Relation Age of Onset   Cerebral aneurysm Mother    Asthma Mother        Deceased   Heart disease Mother    Heart attack Father 40   Hypertension Father    Early death Father    Heart disease Father    Breast cancer Sister    Cancer Sister    Anxiety disorder Brother    Depression Brother    Asthma Brother    Breast cancer Paternal Aunt    Alcohol abuse Paternal Uncle    Stroke Paternal Grandmother    Heart disease Paternal Grandfather    Hypertension Paternal Grandfather    Diabetes Other    Bladder Cancer Neg Hx    Kidney cancer Neg Hx    Social History   Socioeconomic History   Marital status: Widowed    Spouse name: Not on file   Number of children: Not on file   Years of education: Not on file   Highest education level: Some college, no degree  Occupational History   Occupation: Associate Professor: [employer]  Tobacco Use   Smoking status: Former    Current packs/day: 0.00    Average packs/day: 0.5 packs/day for 4.0 years (2.0 ttl pk-yrs)    Types: Cigarettes    Start date: 45    Quit date: 1980    Years since quitting: 45.7   Smokeless tobacco: Never  Vaping Use   Vaping status: Never Used  Substance and Sexual Activity   Alcohol use: Not Currently    Comment: wine occasional   Drug use: Never   Sexual activity: Not Currently    Birth control/protection: None  Other Topics Concern   Not on file  Social History Narrative  Exercises 3 or more times per week walks 30 minutes at a time       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    Overload all the time    Plans to retire in 2014 if possible       Husband with heart disease -- has had bypasses                Social Drivers of Corporate investment banker Strain: Low Risk  (05/04/2024)   Overall Financial Resource Strain (CARDIA)    Difficulty of Paying Living Expenses: Not very hard  Food Insecurity: No Food Insecurity (05/04/2024)   Hunger Vital Sign    Worried About Running Out of Food in the Last Year: Never true    Ran Out of Food in the Last Year: Never true  Transportation Needs: No Transportation Needs (05/04/2024)   PRAPARE - Administrator, Civil Service (Medical): No    Lack of Transportation (Non-Medical): No  Physical Activity: Insufficiently Active (05/04/2024)   Exercise Vital Sign    Days of Exercise per Week: 3 days    Minutes of Exercise per Session: 30 min  Stress: Stress Concern Present (05/04/2024)   [employer] of Occupational Health - Occupational Stress Questionnaire    Feeling of Stress: To some extent  Social Connections: Moderately Integrated (05/04/2024)   Social Connection and Isolation Panel    Frequency of Communication with Friends and Family: More than three times a week    Frequency of Social Gatherings with Friends and Family: Three  times a week    Attends Religious Services: More than 4 times per year    Active Member of Clubs or Organizations: Yes    Attends Banker Meetings: 1 to 4 times per year    Marital Status: Widowed    Tobacco Counseling Counseling given: Not Answered   Clinical Intake:  Pre-visit preparation completed: Yes  Pain : No/denies pain    BMI - recorded: 30.24 Nutritional Status: BMI > 30  Obese Nutritional Risks: None Diabetes: No  Lab Results  Component Value Date   HGBA1C 5.8 05/13/2023   HGBA1C 5.7 05/03/2022   HGBA1C 5.5 04/10/2021     How often do you need to have someone help you when you read instructions, pamphlets, or other written materials from your doctor or pharmacy?: 1 - Never  Interpreter Needed?: No  Comments: lives alone Information entered by :: B.Shannara Winbush,LPN   Activities of Daily Living     05/03/2024   11:08 AM 10/27/2023    8:25 AM  In your present state of health, do you have any difficulty performing the following activities:  Hearing? 0 0  Vision? 0 1  Difficulty concentrating or making decisions? 0 0  Walking or climbing stairs? 0   Dressing or bathing? 0   Doing errands, shopping? 0   Preparing Food and eating ? N   Using the Toilet? N   In the past six months, have you accidently leaked urine? Y   Do you have problems with loss of bowel control? N   Managing your Medications? N   Managing your Finances? N   Housekeeping or managing your Housekeeping? N     Patient Care Team: [name], Laine LABOR, MD as PCP - General (Family Medicine) Green, Davina E, RN as [facility], [facility] (Optometry)  I have updated your Care Teams any recent Medical  Services you may have received from other providers in the past year.     Assessment:   This is a routine wellness examination for [facility].  Hearing/Vision screen Hearing Screening - Comments:: Patient denies any hearing difficulties.   Vision  Screening - Comments:: Pt says their vision is good without glasses;readers only Dr  Mittie   Goals Addressed             This Visit's Progress    COMPLETED: Patient Stated       05/04/24-, Wants to lose 5 pounds     Patient Stated       I would like to keep active, good oiutlook, and lose a little more weight       Depression Screen     05/04/2024    3:09 PM 11/26/2023    2:04 PM 11/19/2023   10:00 AM 07/16/2023   11:59 AM 05/20/2023    8:58 AM 04/17/2023    2:47 PM 04/09/2022   11:41 AM  PHQ 2/9 Scores  PHQ - 2 Score 0 2 2 1 2  0 0  PHQ- 9 Score  7 7 3 4  0     Fall Risk     05/03/2024   11:08 AM 02/10/2024   10:55 AM 11/19/2023   10:00 AM 07/16/2023   11:59 AM 05/20/2023    8:58 AM  Fall Risk   Falls in the past year? 0 0 0 0 0  Number falls in past yr: 0 0 0 0 0  Injury with Fall? 0 0 0 0 0  Risk for fall due to : No Fall Risks No Fall Risks No Fall Risks No Fall Risks No Fall Risks  Follow up Education provided;Falls prevention discussed Falls evaluation completed Falls evaluation completed Falls evaluation completed Falls evaluation completed    MEDICARE RISK AT HOME:  Medicare Risk at Home Any stairs in or around the home?: (Patient-Rptd) Yes If so, are there any without handrails?: (Patient-Rptd) Yes Home free of loose throw rugs in walkways, pet beds, electrical cords, etc?: (Patient-Rptd) Yes Adequate lighting in your home to reduce risk of falls?: (Patient-Rptd) Yes Life alert?: (Patient-Rptd) No Use of a cane, walker or w/c?: (Patient-Rptd) No Grab bars in the bathroom?: (Patient-Rptd) No Shower chair or bench in shower?: (Patient-Rptd) No Elevated toilet seat or a handicapped toilet?: (Patient-Rptd) No  TIMED UP AND GO:  Was the test performed?  No  Cognitive Function: 6CIT completed    04/05/2020    9:09 AM 11/14/2017    9:45 AM 11/13/2016    9:25 AM 11/06/2015   10:41 AM  MMSE - Mini Mental State Exam  Orientation to time 5 5 5  5    Orientation  to Place 5 5 5  5    Registration 3 3 3  3    Attention/ Calculation 5 0 0  5   Recall 3 3 3  3    Language- name 2 objects  0 0  0   Language- repeat 1 1 1 1   Language- follow 3 step command  3 3  3    Language- read & follow direction  0 0  1   Write a sentence  0 0  0   Copy design  0 0  0   Total score  20 20  26       Data saved with a previous flowsheet row definition        05/04/2024    3:19 PM 04/17/2023    2:48  PM 04/09/2022   11:36 AM  6CIT Screen  What Year? 0 points 0 points 0 points  What month? 0 points 0 points 0 points  What time? 0 points 0 points 0 points  Count back from 20 0 points 0 points 0 points  Months in reverse 0 points 0 points 0 points  Repeat phrase 0 points 0 points 0 points  Total Score 0 points 0 points 0 points    Immunizations Immunization History  Administered Date(s) Administered   Fluad Quad(high Dose 65+) 04/30/2022   INFLUENZA, HIGH DOSE SEASONAL PF 05/02/2017, 05/06/2018, 06/07/2020, 06/17/2021, 05/16/2023   Influenza,inj,Quad PF,6+ Mos 06/04/2019   Influenza-Unspecified 05/18/2013, 05/11/2016   PFIZER Comirnaty(Gray Top)Covid-19 Tri-Sucrose Vaccine 05/12/2022   PFIZER(Purple Top)SARS-COV-2 Vaccination 10/18/2019, 11/09/2019, 06/18/2020   PNEUMOCOCCAL CONJUGATE-20 05/16/2023   PPD Test 02/02/2013, 04/24/2016, 12/26/2020   Pneumococcal Conjugate-13 09/09/2014   Pneumococcal Polysaccharide-23 11/14/2017   Pneumococcal-Unspecified 08/27/2012   Respiratory Syncytial Virus Vaccine,Recomb Aduvanted(Arexvy) 06/15/2022   Tdap 01/01/2011, 06/15/2022   Zoster Recombinant(Shingrix) 11/20/2021, 03/12/2022   Zoster, Live 08/13/2012    Screening Tests Health Maintenance  Topic Date Due   Influenza Vaccine  03/26/2024   COVID-19 Vaccine (5 - 2025-26 season) 04/26/2024   MAMMOGRAM  06/30/2024   Medicare Annual Wellness (AWV)  05/04/2025   DTaP/Tdap/Td (3 - Td or Tdap) 06/15/2032   Pneumococcal Vaccine: 50+ Years  Completed   DEXA SCAN   Completed   Hepatitis C Screening  Completed   Zoster Vaccines- Shingrix  Completed   HPV VACCINES  Aged Out   Meningococcal B Vaccine  Aged Out   Colonoscopy  Discontinued    Health Maintenance Items Addressed: None due at this time. Pt will receive vaccines at their pharmacy in the next couple weeks  Additional Screening:  Vision Screening: Recommended annual ophthalmology exams for early detection of glaucoma and other disorders of the eye. Is the patient up to date with their annual eye exam?  Yes  Who is the provider or what is the name of the office in which the patient attends annual eye exams? Dr Mittie  Dental Screening: Recommended annual dental exams for proper oral hygiene  Community Resource Referral / Chronic Care Management: CRR required this visit?  No   CCM required this visit?  No   Plan:    I have personally reviewed and noted the following in the patient's chart:   Medical and social history Use of alcohol, tobacco or illicit drugs  Current medications and supplements including opioid prescriptions. Patient is not currently taking opioid prescriptions. Functional ability and status Nutritional status Physical activity Advanced directives List of other physicians Hospitalizations, surgeries, and ER visits in previous 12 months Vitals Screenings to include cognitive, depression, and falls Referrals and appointments  In addition, I have reviewed and discussed with patient certain preventive protocols, quality metrics, and best practice recommendations. A written personalized care plan for preventive services as well as general preventive health recommendations were provided to patient.   Erminio LITTIE Saris, LPN   0/0/7974   After Visit Summary: (MyChart) Due to this being a telephonic visit, the after visit summary with patients personalized plan was offered to patient via MyChart   Notes: Nothing significant to report at this time.

## 2024-05-05 ENCOUNTER — Ambulatory Visit: Admitting: Internal Medicine

## 2024-05-05 NOTE — Progress Notes (Deleted)
  Cardiology Office Note:  .   Date:  05/05/2024  ID:  Adriana Marks, DOB 08-21-1946, MRN [id number] PCP: Randeen Laine LABOR, MD  [facility] Providers Cardiologist:  Lonni Hanson, MD { Click to update primary MD,subspecialty MD or APP then REFRESH:1}    History of Present Illness: .   Adriana Marks is a 78 y.o. female with history of hypertension, hyperlipidemia, heart murmur, GERD, obstructive sleep apnea, migraine headaches, and depression/anxiety, who presents for follow-up of uncontrolled hypertension and dyspnea on exertion.  I met her in July, at which time Adriana Marks was feeling well other than mild exertional dyspnea and sporadic palpitations.  We agreed to switch from metoprolol  to nebivolol  to see if this would help her blood pressure.  She was also referred for echocardiogram and renal artery Doppler, which showed mild valvular heart disease with normal LVEF and mild bilateral renal artery stenosis (less than 60%).  Serum aldosterone/plasma renin activity ratio was normal.  I It is [place] harari I just want give you guys a heads up we may want to double check her contrast in the injector and all that I know couple years ago there was concern that maybe there was a higher stroke risk but both of our diagnostic cath yesterday Adriana Marks send and then the lady we did afterwards had like TIA and strokes the lady even wind up getting lytics yesterday evening so what ever we need to do to just double check that I will message  ROS: See HPI  Studies Reviewed: SABRA        TTE (04/21/2024): Normal LV size and wall thickness.  LVEF 60-65% with grade 1 diastolic dysfunction.  GLS -19.6%.  Normal RV size and function.  Normal biatrial size.  No pericardial effusion.  Mildly thickened mitral valve with mild regurgitation.  Otherwise, no significant valvular abnormality.  Normal CVP.  Renal artery Doppler (03/22/2024): Mild stenosis of both renal arteries (1-59%).  Risk Assessment/Calculations:    {Does this patient have ATRIAL FIBRILLATION?[phone]} No BP recorded.  {Refresh Note OR Click here to enter BP  :1}***       Physical Exam:   VS:  LMP 08/26/1997    Wt Readings from Last 3 Encounters:  05/04/24 168 lb (76.2 kg)  03/10/24 173 lb 6.4 oz (78.7 kg)  02/10/24 174 lb (78.9 kg)    General:  NAD. Neck: No JVD or HJR. Lungs: Clear to auscultation bilaterally without wheezes or crackles. Heart: Regular rate and rhythm without murmurs, rubs, or gallops. Abdomen: Soft, nontender, nondistended. Extremities: No lower extremity edema.  ASSESSMENT AND PLAN: .    ***    {Are you ordering a CV Procedure (e.g. stress test, cath, DCCV, TEE, etc)?   Press F2        :[phone]}  Dispo: ***  Signed, Lonni Hanson, MD

## 2024-05-07 ENCOUNTER — Ambulatory Visit: Payer: Self-pay | Admitting: *Deleted

## 2024-05-07 NOTE — Telephone Encounter (Signed)
 FYI Only or Action Required?: Action required by provider: clinical question for provider and if medication can be prescribed for covid exposure.  Patient was last seen in primary care on 02/10/2024 by Randeen Laine LABOR, MD.  Called Nurse Triage reporting Covid Exposure.  Symptoms began no symptoms, exposed to family yesterday .  Interventions attempted: Nothing.  Symptoms are: stable.  Triage Disposition: Home Care  Patient/caregiver understands and will follow disposition?: No, wishes to speak with PCP    See NT encounter regarding recommendations and patient requesting PCP recommendations.            Copied from CRM #[id number]. Topic: Clinical - Medical Advice >> May 07, 2024 11:37 AM Deleta RAMAN wrote: Reason for CRM: came in contact with someone with covid unsure what o do. Did not take any test. Patient states she has at home testing and not sure. She also notified me that she had a prescription last time and wonder if she should get the medication just in case. Please contact for further questions Reason for Disposition  [1] COVID-19 EXPOSURE within last 14 days AND [2] NO symptoms  Answer Assessment - Initial Assessment Questions Recommended to do at home covid test with in 3-7 days and if sx noted test then. Patient requesting if PCP would like to send in medication to pharmacy just in case. Recommended patient test for covid first and go to UC over weekend if positive or call back if sx occur. Please advise.        1. COVID-19 EXPOSURE: Please describe how you were exposed to someone with a COVID-19 infection.     Around person with covid  2. PLACE of CONTACT: Where were you when you were exposed to COVID-19? (e.g., home, school, medical waiting room; which city?)     Home  3. TYPE of CONTACT: How much contact was there? (e.g., sitting next to, live in same house, work in same office, same building)     Live in same house  4. DURATION of CONTACT: How long  were you in contact with the COVID-19 patient? (e.g., a few seconds, passed by person, a few minutes, 15 minutes or longer, live with the patient)     15 minutes or longer  5. MASK: Were you wearing a mask? Was the other person wearing a mask? Note: wearing a mask reduces the risk of an otherwise close contact.     Na  6. DATE of CONTACT: When did you have contact with a COVID-19 patient? (e.g., how many days ago)     05/06/24 7. COMMUNITY SPREAD: Do you live in or have you traveled to an area where there are lots of COVID-19 cases (community spread)? (See public health department website, if unsure)       Na  8. SYMPTOMS: Do you have any symptoms? (e.g., fever, cough, breathing difficulty, loss of taste or smell)     No  9. VACCINE: Have you gotten the COVID-19 vaccine? If Yes, ask: Which one, how many shots, when did you get it?     Not this year but has in past  10. PREGNANCY OR POSTPARTUM: Is there any chance you are pregnant? When was your last menstrual period? Did you deliver in the last 2 weeks?       na 11. HIGH RISK: Do you have any heart or lung problems? (e.g., asthma, COPD, heart failure) Do you have a weak immune system or other risk factors? (e.g., HIV positive, chemotherapy, renal failure,  diabetes mellitus, sickle cell anemia, obesity)       See hx for BP no other problems  Protocols used: Coronavirus (COVID-19) Exposure-A-AH

## 2024-05-07 NOTE — Telephone Encounter (Signed)
 Called patient she will call office if she starts having symptoms she will give us  a call and we will get her scheduled.

## 2024-05-08 ENCOUNTER — Telehealth: Admitting: Nurse Practitioner

## 2024-05-08 DIAGNOSIS — U071 COVID-19: Secondary | ICD-10-CM | POA: Diagnosis not present

## 2024-05-08 MED ORDER — NIRMATRELVIR/RITONAVIR (PAXLOVID)TABLET: 3.0000 | ORAL_TABLET | Freq: Two times a day (BID) | ORAL | 0 refills | Status: AC

## 2024-05-08 NOTE — Progress Notes (Signed)
 Virtual Visit Consent   Adriana Marks, you are scheduled for a virtual visit with a  provider today. Just as with appointments in the office, your consent must be obtained to participate. Your consent will be active for this visit and any virtual visit you may have with one of our providers in the next 365 days. If you have a MyChart account, a copy of this consent can be sent to you electronically.  As this is a virtual visit, video technology does not allow for your provider to perform a traditional examination. This may limit your provider's ability to fully assess your condition. If your provider identifies any concerns that need to be evaluated in person or the need to arrange testing (such as labs, EKG, etc.), we will make arrangements to do so. Although advances in technology are sophisticated, we cannot ensure that it will always work on either your end or our end. If the connection with a video visit is poor, the visit may have to be switched to a telephone visit. With either a video or telephone visit, we are not always able to ensure that we have a secure connection.  By engaging in this virtual visit, you consent to the provision of healthcare and authorize for your insurance to be billed (if applicable) for the services provided during this visit. Depending on your insurance coverage, you may receive a charge related to this service.  I need to obtain your verbal consent now. Are you willing to proceed with your visit today? Adriana Marks has provided verbal consent on 05/08/2024 for a virtual visit (video or telephone). Adriana LELON Servant, NP  Date: 05/08/2024 5:21 PM   Virtual Visit via Video Note   I, Adriana Marks, connected with  Adriana Marks  ([id number], 03-Jul-1946) on 05/08/24 at  5:00 PM EDT by a video-enabled telemedicine application and verified that I am speaking with the correct person using two identifiers.  Location: Patient: Virtual Visit Location Patient:  Home Provider: Virtual Visit Location Provider: Home Office   I discussed the limitations of evaluation and management by telemedicine and the availability of in person appointments. The patient expressed understanding and agreed to proceed.    History of Present Illness: Adriana Marks is a 78 y.o. who identifies as a female who was assigned female at birth, and is being seen today for COVID positive.  Adriana Marks tested positive for COVID today via home antigen test.  She is currently experiencing the following symptoms, headache, scratchy throat, dry cough.  She denies fever, chest pain or shortness of breath.  Her symptoms started today.  She reports being exposed to her niece who is a transplant patient and also has been diagnosed with COVID    Problems:  Patient Active Problem List   Diagnosis Date Noted   DOE (dyspnea on exertion) 03/11/2024   Murmur 02/10/2024   Palpitations 02/10/2024   Encounter for screening mammogram for breast cancer 05/20/2023   B12 deficiency 05/20/2023   Current use of proton pump inhibitor 05/12/2023   IBS (irritable bowel syndrome) 01/06/2023   Numbness and tingling in both hands 07/05/2022   OSA (obstructive sleep apnea) 10/05/2021   Chest discomfort 08/01/2021   Sleep apnea 01/29/2021   Chronic headaches 12/07/2020   Hyponatremia 04/12/2020   Screening for colon cancer 10/29/2019   Stress reaction 08/31/2019   Screening mammogram for breast cancer 12/08/2017   Estrogen deficiency 12/04/2016   Grief reaction 09/09/2014   Adjustment disorder  with depressed mood 09/09/2014   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   Prediabetes 08/12/2012   Hyperlipidemia 01/01/2011   Hypertension 01/01/2011   GERD (gastroesophageal reflux disease) 01/01/2011   Routine general medical examination at a health care facility 01/01/2011   Vitamin D  deficiency disease 01/01/2011   Osteoporosis 01/01/2011   Anxiety 01/01/2011   Allergic rhinitis  01/01/2011   Rosacea 01/01/2011   Disorder of bone and cartilage 01/01/2011    Allergies:  Allergies  Allergen Reactions   Shellfish Allergy Anaphylaxis    Sob/ swelling   Alendronate      Nausea  Headache    Amlodipine      headache   Thiazide-Type Diuretics     Hyponatremia     Medications:  Current Outpatient Medications:    nirmatrelvir /ritonavir  (PAXLOVID ) 20 x 150 MG & 10 x 100MG  TABS, Take 3 tablets by mouth 2 (two) times daily for 5 days. (Take nirmatrelvir  150 mg two tablets twice daily for 5 days and ritonavir  100 mg one tablet twice daily for 5 days) Patient GFR is 60, Disp: 30 tablet, Rfl: 0   aspirin 81 MG tablet, Take 81 mg by mouth daily., Disp: , Rfl:    busPIRone  (BUSPAR ) 15 MG tablet, TAKE 1 TABLET BY MOUTH 2 TIMES DAILY., Disp: 180 tablet, Rfl: 3   Cholecalciferol (VITAMIN D3) 250 MCG (10000 UT) capsule, Take 10,000 Units by mouth daily., Disp: , Rfl:    cyanocobalamin  (VITAMIN B12) 1000 MCG tablet, Take 1,000 mcg by mouth daily., Disp: , Rfl:    Docusate Calcium  (STOOL SOFTENER PO), Take 1 capsule by mouth daily., Disp: , Rfl:    famotidine  (PEPCID ) 20 MG tablet, TAKE 1 TABLET BY MOUTH EVERYDAY AT BEDTIME, Disp: 90 tablet, Rfl: 0   loratadine (CLARITIN) 10 MG tablet, Take 10 mg by mouth daily., Disp: , Rfl:    metroNIDAZOLE  (METROGEL ) 1 % gel, Apply topically 2 (two) times daily., Disp: 180 g, Rfl: 3   nebivolol  (BYSTOLIC ) 10 MG tablet, Take 1 tablet (10 mg total) by mouth daily., Disp: 90 tablet, Rfl: 3   olmesartan  (BENICAR ) 40 MG tablet, TAKE 1 TABLET BY MOUTH EVERY DAY, Disp: 90 tablet, Rfl: 1   omeprazole  (PRILOSEC) 20 MG capsule, TAKE 1 CAPSULE BY MOUTH TWICE A DAY, Disp: 180 capsule, Rfl: 0   PROLIA  60 MG/ML SOSY injection, Inject into the skin. Every 6 months, Disp: , Rfl:    sertraline  (ZOLOFT ) 50 MG tablet, TAKE 2 TABLETS BY MOUTH EVERY DAY, Disp: 180 tablet, Rfl: 3   simvastatin  (ZOCOR ) 20 MG tablet, TAKE 1 TABLET BY MOUTH DAILY AT 6 PM., Disp: 90  tablet, Rfl: 1  Current Facility-Administered Medications:    denosumab  (PROLIA ) injection 60 mg, 60 mg, Subcutaneous, Once, [name], Marne A, MD   denosumab  (PROLIA ) injection 60 mg, 60 mg, Subcutaneous, Once, [name], Laine LABOR, MD  Observations/Objective: Patient is well-developed, well-nourished in no acute distress.  Resting comfortably at home.  Head is normocephalic, atraumatic.  No labored breathing.  Speech is clear and coherent with logical content.  Patient is alert and oriented at baseline.    Assessment and Plan: 1. Positive self-administered antigen test for COVID-19 (Primary) - nirmatrelvir /ritonavir  (PAXLOVID ) 20 x 150 MG & 10 x 100MG  TABS; Take 3 tablets by mouth 2 (two) times daily for 5 days. (Take nirmatrelvir  150 mg two tablets twice daily for 5 days and ritonavir  100 mg one tablet twice daily for 5 days) Patient GFR is 60  Dispense: 30  tablet; Refill: 0   Please keep well-hydrated and get plenty of rest. Start a saline nasal rinse to flush out your nasal passages. You can use plain Mucinex to help thin congestion. If you have a humidifier, you can use this daily as needed.    You are to wear a mask for 5 days from onset of your symptoms.  After day 5, if you have had no fever and you are feeling better with NO symptoms, you can end masking. Keep in mind you can be contagious 10 days from the onset of symptoms  After day 5 if you have a fever or are having significant symptoms, please wear your mask for full 10 days.   If you note any worsening of symptoms, any significant shortness of breath or any chest pain, please seek ER evaluation ASAP.  Please do not delay care!    If you note any worsening of symptoms, any significant shortness of breath or any chest pain, please seek ER evaluation ASAP.  Please do not delay care!   Follow Up Instructions: I discussed the assessment and treatment plan with the patient. The patient was provided an opportunity to ask questions  and all were answered. The patient agreed with the plan and demonstrated an understanding of the instructions.  A copy of instructions were sent to the patient via MyChart unless otherwise noted below.     The patient was advised to call back or seek an in-person evaluation if the symptoms worsen or if the condition fails to improve as anticipated.    Lamona Eimer W Calleigh Lafontant, NP

## 2024-05-08 NOTE — Patient Instructions (Signed)
 Adriana Marks, thank you for joining [name], NP for today's virtual visit.  While this provider is not your primary care provider (PCP), if your PCP is located in our provider database this encounter information will be shared with them immediately following your visit.   A [facility] MyChart account gives you access to today's visit and all your visits, tests, and labs performed at [facility]  click here if you don't have a [facility] MyChart account or go to [email or web address]  Consent: (Patient) Adriana Marks provided verbal consent for this virtual visit at the beginning of the encounter.  Current Medications:  Current Outpatient Medications:    nirmatrelvir /ritonavir  (PAXLOVID ) 20 x 150 MG & 10 x 100MG  TABS, Take 3 tablets by mouth 2 (two) times daily for 5 days. (Take nirmatrelvir  150 mg two tablets twice daily for 5 days and ritonavir  100 mg one tablet twice daily for 5 days) Patient GFR is 60, Disp: 30 tablet, Rfl: 0   aspirin 81 MG tablet, Take 81 mg by mouth daily., Disp: , Rfl:    busPIRone  (BUSPAR ) 15 MG tablet, TAKE 1 TABLET BY MOUTH 2 TIMES DAILY., Disp: 180 tablet, Rfl: 3   Cholecalciferol (VITAMIN D3) 250 MCG (10000 UT) capsule, Take 10,000 Units by mouth daily., Disp: , Rfl:    cyanocobalamin  (VITAMIN B12) 1000 MCG tablet, Take 1,000 mcg by mouth daily., Disp: , Rfl:    Docusate Calcium  (STOOL SOFTENER PO), Take 1 capsule by mouth daily., Disp: , Rfl:    famotidine  (PEPCID ) 20 MG tablet, TAKE 1 TABLET BY MOUTH EVERYDAY AT BEDTIME, Disp: 90 tablet, Rfl: 0   loratadine (CLARITIN) 10 MG tablet, Take 10 mg by mouth daily., Disp: , Rfl:    metroNIDAZOLE  (METROGEL ) 1 % gel, Apply topically 2 (two) times daily., Disp: 180 g, Rfl: 3   nebivolol  (BYSTOLIC ) 10 MG tablet, Take 1 tablet (10 mg total) by mouth daily., Disp: 90 tablet, Rfl: 3   olmesartan  (BENICAR ) 40 MG tablet, TAKE 1 TABLET BY MOUTH EVERY DAY, Disp: 90 tablet, Rfl: 1   omeprazole   (PRILOSEC) 20 MG capsule, TAKE 1 CAPSULE BY MOUTH TWICE A DAY, Disp: 180 capsule, Rfl: 0   PROLIA  60 MG/ML SOSY injection, Inject into the skin. Every 6 months, Disp: , Rfl:    sertraline  (ZOLOFT ) 50 MG tablet, TAKE 2 TABLETS BY MOUTH EVERY DAY, Disp: 180 tablet, Rfl: 3   simvastatin  (ZOCOR ) 20 MG tablet, TAKE 1 TABLET BY MOUTH DAILY AT 6 PM., Disp: 90 tablet, Rfl: 1  Current Facility-Administered Medications:    denosumab  (PROLIA ) injection 60 mg, 60 mg, Subcutaneous, Once, [name], Marne A, MD   denosumab  (PROLIA ) injection 60 mg, 60 mg, Subcutaneous, Once, [name], Laine LABOR, MD   Medications ordered in this encounter:  Meds ordered this encounter  Medications   nirmatrelvir /ritonavir  (PAXLOVID ) 20 x 150 MG & 10 x 100MG  TABS    Sig: Take 3 tablets by mouth 2 (two) times daily for 5 days. (Take nirmatrelvir  150 mg two tablets twice daily for 5 days and ritonavir  100 mg one tablet twice daily for 5 days) Patient GFR is 60    Dispense:  30 tablet    Refill:  0    Supervising Provider:   BLAISE ALEENE KIDD [phone]     *If you need refills on other medications prior to your next appointment, please contact your pharmacy*  Follow-Up: Call back or seek an in-person evaluation if the symptoms worsen or if the condition  fails to improve as anticipated.  [facility] [phone]  Other Instructions  Please keep well-hydrated and get plenty of rest. Start a saline nasal rinse to flush out your nasal passages. You can use plain Mucinex to help thin congestion. If you have a humidifier, you can use this daily as needed.    You are to wear a mask for 5 days from onset of your symptoms.  After day 5, if you have had no fever and you are feeling better with NO symptoms, you can end masking. Keep in mind you can be contagious 10 days from the onset of symptoms  After day 5 if you have a fever or are having significant symptoms, please wear your mask for full 10 days.   If you note  any worsening of symptoms, any significant shortness of breath or any chest pain, please seek ER evaluation ASAP.  Please do not delay care!    If you note any worsening of symptoms, any significant shortness of breath or any chest pain, please seek ER evaluation ASAP.  Please do not delay care!    If you have been instructed to have an in-person evaluation today at a local Urgent Care facility, please use the link below. It will take you to a list of all of our available [facility], including address, phone number and hours of operation. Please do not delay care.  [facility]  If you or a family member do not have a primary care provider, use the link below to schedule a visit and establish care. When you choose a [facility] primary care physician or advanced practice provider, you gain a long-term partner in health. Find a Primary Care Provider  Learn more about [facility]'s in-office and virtual care options: [facility] - Get Care Now

## 2024-05-10 ENCOUNTER — Telehealth: Payer: Self-pay

## 2024-05-10 NOTE — Telephone Encounter (Signed)
 Per chart review tab pt had [facility] on 05/08/24; paxlovid  was prescribed. Sending note to Dr Randeen who is out of office and  Dr Cleatus who is in office.

## 2024-05-10 NOTE — Telephone Encounter (Signed)
 Patient reports feeling better.  She did not pick up the paxlovid , was going to be over $400.  She has not had a fever since yesterday and it eating and drinking.  She denies any dyspnea.  Did request that I reschedule her lab appointment for next week prior to her appointment with Dr. Randeen.

## 2024-05-10 NOTE — Telephone Encounter (Signed)
 SABRA

## 2024-05-10 NOTE — Telephone Encounter (Signed)
Noted.  Thanks.  Please get update on patient. 

## 2024-05-12 ENCOUNTER — Other Ambulatory Visit: Payer: Self-pay | Admitting: Family Medicine

## 2024-05-13 ENCOUNTER — Telehealth: Payer: Self-pay | Admitting: Family Medicine

## 2024-05-13 DIAGNOSIS — E7800 Pure hypercholesterolemia, unspecified: Secondary | ICD-10-CM

## 2024-05-13 DIAGNOSIS — R7303 Prediabetes: Secondary | ICD-10-CM

## 2024-05-13 DIAGNOSIS — M810 Age-related osteoporosis without current pathological fracture: Secondary | ICD-10-CM

## 2024-05-13 DIAGNOSIS — E559 Vitamin D deficiency, unspecified: Secondary | ICD-10-CM

## 2024-05-13 DIAGNOSIS — I10 Essential (primary) hypertension: Secondary | ICD-10-CM

## 2024-05-13 DIAGNOSIS — E538 Deficiency of other specified B group vitamins: Secondary | ICD-10-CM

## 2024-05-13 NOTE — Telephone Encounter (Signed)
-----   Message from Harlene Du sent at 04/29/2024 10:18 AM EDT ----- Regarding: Labs Fri 05/14/24 Hello,  Patient is coming in for CPE labs on Friday 05/14/24. Can we get orders please.   Thanks

## 2024-05-14 ENCOUNTER — Other Ambulatory Visit

## 2024-05-16 ENCOUNTER — Telehealth: Payer: Self-pay | Admitting: Family Medicine

## 2024-05-16 DIAGNOSIS — E538 Deficiency of other specified B group vitamins: Secondary | ICD-10-CM

## 2024-05-16 DIAGNOSIS — E7800 Pure hypercholesterolemia, unspecified: Secondary | ICD-10-CM

## 2024-05-16 DIAGNOSIS — I10 Essential (primary) hypertension: Secondary | ICD-10-CM

## 2024-05-16 DIAGNOSIS — Z79899 Other long term (current) drug therapy: Secondary | ICD-10-CM

## 2024-05-16 DIAGNOSIS — E559 Vitamin D deficiency, unspecified: Secondary | ICD-10-CM

## 2024-05-16 DIAGNOSIS — R7303 Prediabetes: Secondary | ICD-10-CM

## 2024-05-16 NOTE — Telephone Encounter (Signed)
-----   Message from Veva JINNY Ferrari sent at 05/12/2024  2:39 PM EDT ----- Regarding: Lab orders for Tue, 9.23.25 Patient is scheduled for CPX labs, please order future labs, Thanks , Veva

## 2024-05-18 ENCOUNTER — Other Ambulatory Visit

## 2024-05-20 ENCOUNTER — Encounter: Admitting: Family Medicine

## 2024-05-21 ENCOUNTER — Encounter: Admitting: Family

## 2024-06-03 NOTE — Progress Notes (Signed)
 Adriana Marks                                          MRN: [id number]   06/03/2024   The VBCI Quality Team Specialist reviewed this patient medical record for the purposes of chart review for care gap closure. The following were reviewed: chart review for care gap closure-controlling blood pressure.    VBCI Quality Team

## 2024-06-04 ENCOUNTER — Other Ambulatory Visit: Payer: Self-pay | Admitting: Family Medicine

## 2024-06-23 ENCOUNTER — Other Ambulatory Visit: Payer: Self-pay | Admitting: Family Medicine

## 2024-06-23 ENCOUNTER — Other Ambulatory Visit

## 2024-06-23 ENCOUNTER — Ambulatory Visit: Admitting: Internal Medicine

## 2024-06-23 ENCOUNTER — Encounter: Admitting: Family Medicine

## 2024-06-23 NOTE — Progress Notes (Deleted)
  Cardiology Office Note:  .   Date:  06/23/2024  ID:  Adriana Marks, DOB 1946/03/19, MRN [id number] PCP: Randeen Laine LABOR, MD   [facility] Providers Cardiologist:  Lonni Hanson, MD { Click to update primary MD,subspecialty MD or APP then REFRESH:1}    History of Present Illness: .   Adriana Marks is a 78 y.o. female with history of hypertension, hyperlipidemia, heart murmur (aortic sclerosis and mild mitral regurgitation on echo in 03/2024), GERD, obstructive sleep apnea, migraine headaches, and depression/anxiety, who presents for follow-up of hypertension and heart murmur.  I met her in July, at which time she was feeling well other than mild exertional dyspnea and sporadic palpitations.  Blood pressure was moderately to severely elevated (154/90 to 180/92).  We agreed to switch from metoprolol  to Nebivolol  and continue olmesartan .  Renal artery Doppler and serum aldosterone/plasma renin activity ratio were also obtained.  Mild bilateral renal artery stenosis was noted.  Serum aldosterone/plasma renin activity ratio was normal.  ROS: See HPI  Studies Reviewed: SABRA       TTE (04/21/2024): Normal LV size and wall thickness.  LVEF 60-65% with normal wall motion and grade 1 diastolic dysfunction.  Global longitudinal strain -19.6%.  Normal RV size and function.  Normal biatrial size.  No pericardial effusion.  Mildly thickened mitral valve with mild regurgitation.  Aortic sclerosis without stenosis.  Normal CVP.  Renal artery Doppler (03/22/2024): Mild bilateral renal artery stenoses (1-59%).  Risk Assessment/Calculations:   {Does this patient have ATRIAL FIBRILLATION?:[phone]} No BP recorded.  {Refresh Note OR Click here to enter BP  :1}***       Physical Exam:   VS:  LMP 08/26/1997    Wt Readings from Last 3 Encounters:  05/04/24 168 lb (76.2 kg)  03/10/24 173 lb 6.4 oz (78.7 kg)  02/10/24 174 lb (78.9 kg)    General:  NAD. Neck: No JVD or HJR. Lungs: Clear to auscultation  bilaterally without wheezes or crackles. Heart: Regular rate and rhythm without murmurs, rubs, or gallops. Abdomen: Soft, nontender, nondistended. Extremities: No lower extremity edema.  ASSESSMENT AND PLAN: .    ***    {Are you ordering a CV Procedure (e.g. stress test, cath, DCCV, TEE, etc)?   Press F2        :[id number]}  Dispo: ***  Signed, Lonni Hanson, MD

## 2024-06-29 ENCOUNTER — Encounter: Admitting: Family Medicine

## 2024-07-12 ENCOUNTER — Encounter

## 2024-07-12 ENCOUNTER — Telehealth: Admitting: Emergency Medicine

## 2024-07-12 DIAGNOSIS — J039 Acute tonsillitis, unspecified: Secondary | ICD-10-CM | POA: Diagnosis not present

## 2024-07-12 MED ORDER — AMOXICILLIN 500 MG PO CAPS: 500.0000 mg | ORAL_CAPSULE | Freq: Two times a day (BID) | ORAL | 0 refills | Status: AC

## 2024-07-12 NOTE — Patient Instructions (Signed)
 Adriana Marks, thank you for joining Jon CHRISTELLA Belt, NP for today's virtual visit.  While this provider is not your primary care provider (PCP), if your PCP is located in our provider database this encounter information will be shared with them immediately following your visit.   A [facility] MyChart account gives you access to today's visit and all your visits, tests, and labs performed at [facility]  click here if you don't have a [facility] MyChart account or go to [email or web address]  Consent: (Patient) Adriana Marks provided verbal consent for this virtual visit at the beginning of the encounter.  Current Medications:  Current Outpatient Medications:    amoxicillin  (AMOXIL ) 500 MG capsule, Take 1 capsule (500 mg total) by mouth 2 (two) times daily for 10 days., Disp: 20 capsule, Rfl: 0   aspirin 81 MG tablet, Take 81 mg by mouth daily., Disp: , Rfl:    busPIRone  (BUSPAR ) 15 MG tablet, TAKE 1 TABLET BY MOUTH 2 TIMES DAILY., Disp: 180 tablet, Rfl: 0   Cholecalciferol (VITAMIN D3) 250 MCG (10000 UT) capsule, Take 10,000 Units by mouth daily., Disp: , Rfl:    cyanocobalamin  (VITAMIN B12) 1000 MCG tablet, Take 1,000 mcg by mouth daily., Disp: , Rfl:    Docusate Calcium  (STOOL SOFTENER PO), Take 1 capsule by mouth daily., Disp: , Rfl:    famotidine  (PEPCID ) 20 MG tablet, TAKE 1 TABLET BY MOUTH EVERYDAY AT BEDTIME, Disp: 90 tablet, Rfl: 0   loratadine (CLARITIN) 10 MG tablet, Take 10 mg by mouth daily., Disp: , Rfl:    metroNIDAZOLE  (METROGEL ) 1 % gel, Apply topically 2 (two) times daily., Disp: 180 g, Rfl: 3   nebivolol  (BYSTOLIC ) 10 MG tablet, Take 1 tablet (10 mg total) by mouth daily., Disp: 90 tablet, Rfl: 3   olmesartan  (BENICAR ) 40 MG tablet, TAKE 1 TABLET BY MOUTH EVERY DAY, Disp: 90 tablet, Rfl: 1   omeprazole  (PRILOSEC) 20 MG capsule, TAKE 1 CAPSULE BY MOUTH TWICE A DAY, Disp: 180 capsule, Rfl: 1   PROLIA  60 MG/ML SOSY injection, Inject into the skin. Every 6  months, Disp: , Rfl:    sertraline  (ZOLOFT ) 50 MG tablet, TAKE 2 TABLETS BY MOUTH EVERY DAY, Disp: 180 tablet, Rfl: 0   simvastatin  (ZOCOR ) 20 MG tablet, TAKE 1 TABLET BY MOUTH DAILY AT 6 PM., Disp: 90 tablet, Rfl: 1  Current Facility-Administered Medications:    denosumab  (PROLIA ) injection 60 mg, 60 mg, Subcutaneous, Once, [name], Marne A, MD   denosumab  (PROLIA ) injection 60 mg, 60 mg, Subcutaneous, Once, [name], Laine LABOR, MD   Medications ordered in this encounter:  Meds ordered this encounter  Medications   amoxicillin  (AMOXIL ) 500 MG capsule    Sig: Take 1 capsule (500 mg total) by mouth 2 (two) times daily for 10 days.    Dispense:  20 capsule    Refill:  0     *If you need refills on other medications prior to your next appointment, please contact your pharmacy*  Follow-Up: Call back or seek an in-person evaluation if the symptoms worsen or if the condition fails to improve as anticipated.  [facility] [phone]  Other Instructions  You should feel much better within 48 hours of starting the antibiotics. If you are not getting better within 2 days, please be checked in person.   If you feel your large tonsils are starting to restrict your airway, call 911.    If you have been instructed to have an  in-person evaluation today at a local Urgent Care facility, please use the link below. It will take you to a list of all of our available [facility], including address, phone number and hours of operation. Please do not delay care.  [facility]  If you or a family member do not have a primary care provider, use the link below to schedule a visit and establish care. When you choose a [facility] primary care physician or advanced practice provider, you gain a long-term partner in health. Find a Primary Care Provider  Learn more about [facility]'s in-office and virtual care options:  - Get Care Now

## 2024-07-12 NOTE — Progress Notes (Signed)
 Virtual Visit Consent   Adriana Marks, you are scheduled for a virtual visit with a [facility] provider today. Just as with appointments in the office, your consent must be obtained to participate. Your consent will be active for this visit and any virtual visit you may have with one of our providers in the next 365 days. If you have a MyChart account, a copy of this consent can be sent to you electronically.  As this is a virtual visit, video technology does not allow for your provider to perform a traditional examination. This may limit your provider's ability to fully assess your condition. If your provider identifies any concerns that need to be evaluated in person or the need to arrange testing (such as labs, EKG, etc.), we will make arrangements to do so. Although advances in technology are sophisticated, we cannot ensure that it will always work on either your end or our end. If the connection with a video visit is poor, the visit may have to be switched to a telephone visit. With either a video or telephone visit, we are not always able to ensure that we have a secure connection.  By engaging in this virtual visit, you consent to the provision of healthcare and authorize for your insurance to be billed (if applicable) for the services provided during this visit. Depending on your insurance coverage, you may receive a charge related to this service.  I need to obtain your verbal consent now. Are you willing to proceed with your visit today? Adriana Marks has provided verbal consent on 07/12/2024 for a virtual visit (video or telephone). Jon CHRISTELLA Belt, NP  Date: 07/12/2024 8:37 AM   Virtual Visit via Video Note   I, Jon CHRISTELLA Belt, connected with  Adriana Marks  ([id number], 10/04/1945) on 07/12/24 at  8:30 AM EST by a video-enabled telemedicine application and verified that I am speaking with the correct person using two identifiers.  Location: Patient: Virtual Visit Location Patient:  Home Provider: Virtual Visit Location Provider: Home Office   I discussed the limitations of evaluation and management by telemedicine and the availability of in person appointments. The patient expressed understanding and agreed to proceed.    History of Present Illness: Adriana Marks is a 78 y.o. who identifies as a female who was assigned female at birth, and is being seen today for sore throat mostly in R tonsil area x 4 days.   Swollen, painful, pain going up to ear, lymph node in R submandibular area swollen and painful.   B tonsils swollen and almost touching, bigger than normal. Very red, look like raw meat.   Fever low grade  yesterday. Woke up sweaty, felt like fever was gone. Also headache. No stuffy nose or coughing.   Taking tylenol for pain. No SOB or airway concerns  Not around anyone with strep that she knows of. Has been around her niece's children recently and one child was being treated for ear infection.   HPI: HPI  Problems:  Patient Active Problem List   Diagnosis Date Noted   DOE (dyspnea on exertion) 03/11/2024   Murmur 02/10/2024   Palpitations 02/10/2024   Encounter for screening mammogram for breast cancer 05/20/2023   B12 deficiency 05/20/2023   Current use of proton pump inhibitor 05/12/2023   IBS (irritable bowel syndrome) 01/06/2023   Numbness and tingling in both hands 07/05/2022   OSA (obstructive sleep apnea) 10/05/2021   Chest discomfort 08/01/2021   Sleep apnea  01/29/2021   Chronic headaches 12/07/2020   Hyponatremia 04/12/2020   Screening for colon cancer 10/29/2019   Stress reaction 08/31/2019   Screening mammogram for breast cancer 12/08/2017   Estrogen deficiency 12/04/2016   Grief reaction 09/09/2014   Adjustment disorder with depressed mood 09/09/2014   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   Prediabetes 08/12/2012   Hyperlipidemia 01/01/2011   Hypertension 01/01/2011   GERD (gastroesophageal reflux disease)  01/01/2011   Routine general medical examination at a health care facility 01/01/2011   Vitamin D  deficiency disease 01/01/2011   Osteoporosis 01/01/2011   Anxiety 01/01/2011   Allergic rhinitis 01/01/2011   Rosacea 01/01/2011   Disorder of bone and cartilage 01/01/2011    Allergies:  Allergies  Allergen Reactions   Shellfish Allergy Anaphylaxis    Sob/ swelling   Alendronate      Nausea  Headache    Amlodipine      headache   Thiazide-Type Diuretics     Hyponatremia     Medications:  Current Outpatient Medications:    amoxicillin  (AMOXIL ) 500 MG capsule, Take 1 capsule (500 mg total) by mouth 2 (two) times daily for 10 days., Disp: 20 capsule, Rfl: 0   aspirin 81 MG tablet, Take 81 mg by mouth daily., Disp: , Rfl:    busPIRone  (BUSPAR ) 15 MG tablet, TAKE 1 TABLET BY MOUTH 2 TIMES DAILY., Disp: 180 tablet, Rfl: 0   Cholecalciferol (VITAMIN D3) 250 MCG (10000 UT) capsule, Take 10,000 Units by mouth daily., Disp: , Rfl:    cyanocobalamin  (VITAMIN B12) 1000 MCG tablet, Take 1,000 mcg by mouth daily., Disp: , Rfl:    Docusate Calcium  (STOOL SOFTENER PO), Take 1 capsule by mouth daily., Disp: , Rfl:    famotidine  (PEPCID ) 20 MG tablet, TAKE 1 TABLET BY MOUTH EVERYDAY AT BEDTIME, Disp: 90 tablet, Rfl: 0   loratadine (CLARITIN) 10 MG tablet, Take 10 mg by mouth daily., Disp: , Rfl:    metroNIDAZOLE  (METROGEL ) 1 % gel, Apply topically 2 (two) times daily., Disp: 180 g, Rfl: 3   nebivolol  (BYSTOLIC ) 10 MG tablet, Take 1 tablet (10 mg total) by mouth daily., Disp: 90 tablet, Rfl: 3   olmesartan  (BENICAR ) 40 MG tablet, TAKE 1 TABLET BY MOUTH EVERY DAY, Disp: 90 tablet, Rfl: 1   omeprazole  (PRILOSEC) 20 MG capsule, TAKE 1 CAPSULE BY MOUTH TWICE A DAY, Disp: 180 capsule, Rfl: 1   PROLIA  60 MG/ML SOSY injection, Inject into the skin. Every 6 months, Disp: , Rfl:    sertraline  (ZOLOFT ) 50 MG tablet, TAKE 2 TABLETS BY MOUTH EVERY DAY, Disp: 180 tablet, Rfl: 0   simvastatin  (ZOCOR ) 20 MG tablet,  TAKE 1 TABLET BY MOUTH DAILY AT 6 PM., Disp: 90 tablet, Rfl: 1  Current Facility-Administered Medications:    denosumab  (PROLIA ) injection 60 mg, 60 mg, Subcutaneous, Once, [name], Marne A, MD   denosumab  (PROLIA ) injection 60 mg, 60 mg, Subcutaneous, Once, [name], Laine LABOR, MD  Observations/Objective: Patient is well-developed, well-nourished in no acute distress.  Resting comfortably  at home.  Head is normocephalic, atraumatic.  No labored breathing.  Speech is clear and coherent with logical content.  Patient is alert and oriented at baseline.    Assessment and Plan: 1. Tonsillitis (Primary) - amoxicillin  (AMOXIL ) 500 MG capsule; Take 1 capsule (500 mg total) by mouth 2 (two) times daily for 10 days.  Dispense: 20 capsule; Refill: 0  Possible the child with an ear infection had an ear infection due to strep.  Based on description of sx, will tx for strep. Discussed with pt seeking emergency care if tonsils enlarge enough to impact airway, or if not improving on antibiotics.   Follow Up Instructions: I discussed the assessment and treatment plan with the patient. The patient was provided an opportunity to ask questions and all were answered. The patient agreed with the plan and demonstrated an understanding of the instructions.  A copy of instructions were sent to the patient via MyChart unless otherwise noted below.   The patient was advised to call back or seek an in-person evaluation if the symptoms worsen or if the condition fails to improve as anticipated.    Jon CHRISTELLA Belt, NP

## 2024-07-24 ENCOUNTER — Telehealth: Admitting: Family Medicine

## 2024-07-24 DIAGNOSIS — J020 Streptococcal pharyngitis: Secondary | ICD-10-CM

## 2024-07-24 MED ORDER — CLINDAMYCIN HCL 300 MG PO CAPS: 300.0000 mg | ORAL_CAPSULE | Freq: Three times a day (TID) | ORAL | 0 refills | Status: AC

## 2024-07-24 NOTE — Patient Instructions (Signed)

## 2024-07-24 NOTE — Progress Notes (Signed)
 Virtual Visit Consent   Adriana Marks, you are scheduled for a virtual visit with a [facility] provider today. Just as with appointments in the office, your consent must be obtained to participate. Your consent will be active for this visit and any virtual visit you may have with one of our providers in the next 365 days. If you have a MyChart account, a copy of this consent can be sent to you electronically.  As this is a virtual visit, video technology does not allow for your provider to perform a traditional examination. This may limit your provider's ability to fully assess your condition. If your provider identifies any concerns that need to be evaluated in person or the need to arrange testing (such as labs, EKG, etc.), we will make arrangements to do so. Although advances in technology are sophisticated, we cannot ensure that it will always work on either your end or our end. If the connection with a video visit is poor, the visit may have to be switched to a telephone visit. With either a video or telephone visit, we are not always able to ensure that we have a secure connection.  By engaging in this virtual visit, you consent to the provision of healthcare and authorize for your insurance to be billed (if applicable) for the services provided during this visit. Depending on your insurance coverage, you may receive a charge related to this service.  I need to obtain your verbal consent now. Are you willing to proceed with your visit today? Adriana Marks has provided verbal consent on 07/24/2024 for a virtual visit (video or telephone). Loa Lamp, FNP  Date: 07/24/2024 10:04 AM   Virtual Visit via Video Note   I, Loa Lamp, connected with  Adriana Marks  ([id number], 02-20-1946) on 07/24/24 at 10:00 AM EST by a video-enabled telemedicine application and verified that I am speaking with the correct person using two identifiers.  Location: Patient: Virtual Visit Location Patient:  Home Provider: Virtual Visit Location Provider: Home Office   I discussed the limitations of evaluation and management by telemedicine and the availability of in person appointments. The patient expressed understanding and agreed to proceed.    History of Present Illness: Adriana Marks is a 78 y.o. who identifies as a female who was assigned female at birth, and is being seen today for a persistent sore throat. She took amoxil  and it was better Wednesday on the last day but has worsened since then. It was right sided pain and now is both. She can swallow. No URI sx. Adriana Marks  HPI: HPI  Problems:  Patient Active Problem List   Diagnosis Date Noted   DOE (dyspnea on exertion) 03/11/2024   Murmur 02/10/2024   Palpitations 02/10/2024   Encounter for screening mammogram for breast cancer 05/20/2023   B12 deficiency 05/20/2023   Current use of proton pump inhibitor 05/12/2023   IBS (irritable bowel syndrome) 01/06/2023   Numbness and tingling in both hands 07/05/2022   OSA (obstructive sleep apnea) 10/05/2021   Chest discomfort 08/01/2021   Sleep apnea 01/29/2021   Chronic headaches 12/07/2020   Hyponatremia 04/12/2020   Screening for colon cancer 10/29/2019   Stress reaction 08/31/2019   Screening mammogram for breast cancer 12/08/2017   Estrogen deficiency 12/04/2016   Grief reaction 09/09/2014   Adjustment disorder with depressed mood 09/09/2014   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   Prediabetes 08/12/2012   Hyperlipidemia 01/01/2011   Hypertension 01/01/2011  GERD (gastroesophageal reflux disease) 01/01/2011   Routine general medical examination at a health care facility 01/01/2011   Vitamin D  deficiency disease 01/01/2011   Osteoporosis 01/01/2011   Anxiety 01/01/2011   Allergic rhinitis 01/01/2011   Rosacea 01/01/2011   Disorder of bone and cartilage 01/01/2011    Allergies:  Allergies  Allergen Reactions   Shellfish Allergy Anaphylaxis    Sob/  swelling   Alendronate      Nausea  Headache    Amlodipine      headache   Thiazide-Type Diuretics     Hyponatremia     Medications:  Current Outpatient Medications:    aspirin 81 MG tablet, Take 81 mg by mouth daily., Disp: , Rfl:    busPIRone  (BUSPAR ) 15 MG tablet, TAKE 1 TABLET BY MOUTH 2 TIMES DAILY., Disp: 180 tablet, Rfl: 0   Cholecalciferol (VITAMIN D3) 250 MCG (10000 UT) capsule, Take 10,000 Units by mouth daily., Disp: , Rfl:    cyanocobalamin  (VITAMIN B12) 1000 MCG tablet, Take 1,000 mcg by mouth daily., Disp: , Rfl:    Docusate Calcium  (STOOL SOFTENER PO), Take 1 capsule by mouth daily., Disp: , Rfl:    famotidine  (PEPCID ) 20 MG tablet, TAKE 1 TABLET BY MOUTH EVERYDAY AT BEDTIME, Disp: 90 tablet, Rfl: 0   loratadine (CLARITIN) 10 MG tablet, Take 10 mg by mouth daily., Disp: , Rfl:    metroNIDAZOLE  (METROGEL ) 1 % gel, Apply topically 2 (two) times daily., Disp: 180 g, Rfl: 3   nebivolol  (BYSTOLIC ) 10 MG tablet, Take 1 tablet (10 mg total) by mouth daily., Disp: 90 tablet, Rfl: 3   olmesartan  (BENICAR ) 40 MG tablet, TAKE 1 TABLET BY MOUTH EVERY DAY, Disp: 90 tablet, Rfl: 1   omeprazole  (PRILOSEC) 20 MG capsule, TAKE 1 CAPSULE BY MOUTH TWICE A DAY, Disp: 180 capsule, Rfl: 1   PROLIA  60 MG/ML SOSY injection, Inject into the skin. Every 6 months, Disp: , Rfl:    sertraline  (ZOLOFT ) 50 MG tablet, TAKE 2 TABLETS BY MOUTH EVERY DAY, Disp: 180 tablet, Rfl: 0   simvastatin  (ZOCOR ) 20 MG tablet, TAKE 1 TABLET BY MOUTH DAILY AT 6 PM., Disp: 90 tablet, Rfl: 1  Current Facility-Administered Medications:    denosumab  (PROLIA ) injection 60 mg, 60 mg, Subcutaneous, Once, [name], Marne A, MD   denosumab  (PROLIA ) injection 60 mg, 60 mg, Subcutaneous, Once, [name], Laine A, MD  Observations/Objective: Patient is well-developed, well-nourished in no acute distress.  Resting comfortably  at home.  Head is normocephalic, atraumatic.  No labored breathing.  Speech is clear and coherent with  logical content.  Patient is alert and oriented at baseline.    Assessment and Plan: There are no diagnoses linked to this encounter. Increase fluids, warm salt water gargles, tylenol or ibuprofen Discussed if sx worsen to go to UC.   Follow Up Instructions: I discussed the assessment and treatment plan with the patient. The patient was provided an opportunity to ask questions and all were answered. The patient agreed with the plan and demonstrated an understanding of the instructions.  A copy of instructions were sent to the patient via MyChart unless otherwise noted below.     The patient was advised to call back or seek an in-person evaluation if the symptoms worsen or if the condition fails to improve as anticipated.    Blaise Grieshaber, FNP

## 2024-07-26 ENCOUNTER — Other Ambulatory Visit

## 2024-07-28 ENCOUNTER — Ambulatory Visit: Payer: Self-pay | Admitting: Family Medicine

## 2024-07-28 ENCOUNTER — Other Ambulatory Visit

## 2024-07-28 DIAGNOSIS — I10 Essential (primary) hypertension: Secondary | ICD-10-CM | POA: Diagnosis not present

## 2024-07-28 DIAGNOSIS — E559 Vitamin D deficiency, unspecified: Secondary | ICD-10-CM

## 2024-07-28 DIAGNOSIS — R7303 Prediabetes: Secondary | ICD-10-CM

## 2024-07-28 DIAGNOSIS — E538 Deficiency of other specified B group vitamins: Secondary | ICD-10-CM | POA: Diagnosis not present

## 2024-07-28 DIAGNOSIS — E7800 Pure hypercholesterolemia, unspecified: Secondary | ICD-10-CM

## 2024-07-28 LAB — COMPREHENSIVE METABOLIC PANEL WITH GFR
ALT: 14 U/L (ref 0–35)
AST: 16 U/L (ref 0–37)
Albumin: 3.8 g/dL (ref 3.5–5.2)
Alkaline Phosphatase: 52 U/L (ref 39–117)
BUN: 12 mg/dL (ref 6–23)
CO2: 29 meq/L (ref 19–32)
Calcium: 9.8 mg/dL (ref 8.4–10.5)
Chloride: 98 meq/L (ref 96–112)
Creatinine, Ser: 0.64 mg/dL (ref 0.40–1.20)
GFR: 85.72 mL/min (ref >60.00)
Glucose, Bld: 100 mg/dL — ABNORMAL HIGH (ref 70–99)
Potassium: 4 meq/L (ref 3.5–5.1)
Sodium: 136 meq/L (ref 135–145)
Total Bilirubin: 0.5 mg/dL (ref 0.2–1.2)
Total Protein: 7.1 g/dL (ref 6.0–8.3)

## 2024-07-28 LAB — CBC WITH DIFFERENTIAL/PLATELET
Basophils Absolute: 0.1 K/uL (ref 0.0–0.1)
Basophils Relative: 0.8 % (ref 0.0–3.0)
Eosinophils Absolute: 0.1 K/uL (ref 0.0–0.7)
Eosinophils Relative: 1 % (ref 0.0–5.0)
HCT: 32.8 % — ABNORMAL LOW (ref 36.0–46.0)
Hemoglobin: 11.3 g/dL — ABNORMAL LOW (ref 12.0–15.0)
Lymphocytes Relative: 23.4 % (ref 12.0–46.0)
Lymphs Abs: 1.6 K/uL (ref 0.7–4.0)
MCHC: 34.3 g/dL (ref 30.0–36.0)
MCV: 89.8 fl (ref 78.0–100.0)
Monocytes Absolute: 0.7 K/uL (ref 0.1–1.0)
Monocytes Relative: 10.6 % (ref 3.0–12.0)
Neutro Abs: 4.4 K/uL (ref 1.4–7.7)
Neutrophils Relative %: 64.2 % (ref 43.0–77.0)
Platelets: 412 K/uL — ABNORMAL HIGH (ref 150.0–400.0)
RBC: 3.66 Mil/uL — ABNORMAL LOW (ref 3.87–5.11)
RDW: 13.1 % (ref 11.5–15.5)
WBC: 6.9 K/uL (ref 4.0–10.5)

## 2024-07-28 LAB — LIPID PANEL
Cholesterol: 97 mg/dL (ref 0–200)
HDL: 29.8 mg/dL — ABNORMAL LOW (ref >39.00)
LDL Cholesterol: 54 mg/dL (ref 0–99)
NonHDL: 67.65
Total CHOL/HDL Ratio: 3
Triglycerides: 67 mg/dL (ref 0.0–149.0)
VLDL: 13.4 mg/dL (ref 0.0–40.0)

## 2024-07-28 LAB — VITAMIN D 25 HYDROXY (VIT D DEFICIENCY, FRACTURES): VITD: 69.86 ng/mL (ref 30.00–100.00)

## 2024-07-28 LAB — TSH: TSH: 0.05 u[IU]/mL — ABNORMAL LOW (ref 0.35–5.50)

## 2024-07-28 LAB — VITAMIN B12: Vitamin B-12: 902 pg/mL (ref 211–911)

## 2024-07-28 LAB — HEMOGLOBIN A1C: Hgb A1c MFr Bld: 5.6 % (ref 4.6–6.5)

## 2024-08-03 ENCOUNTER — Encounter: Payer: Self-pay | Admitting: Family Medicine

## 2024-08-05 ENCOUNTER — Encounter: Payer: Self-pay | Admitting: Family Medicine

## 2024-08-05 ENCOUNTER — Ambulatory Visit: Admitting: Family Medicine

## 2024-08-05 ENCOUNTER — Ambulatory Visit: Payer: Self-pay | Admitting: Family Medicine

## 2024-08-05 VITALS — BP 136/84 | HR 76 | Temp 98.9°F | Ht 61.75 in | Wt 167.5 lb

## 2024-08-05 DIAGNOSIS — M810 Age-related osteoporosis without current pathological fracture: Secondary | ICD-10-CM

## 2024-08-05 DIAGNOSIS — E7800 Pure hypercholesterolemia, unspecified: Secondary | ICD-10-CM | POA: Diagnosis not present

## 2024-08-05 DIAGNOSIS — Z0000 Encounter for general adult medical examination without abnormal findings: Secondary | ICD-10-CM | POA: Diagnosis not present

## 2024-08-05 DIAGNOSIS — Z79899 Other long term (current) drug therapy: Secondary | ICD-10-CM | POA: Diagnosis not present

## 2024-08-05 DIAGNOSIS — K219 Gastro-esophageal reflux disease without esophagitis: Secondary | ICD-10-CM | POA: Diagnosis not present

## 2024-08-05 DIAGNOSIS — G4733 Obstructive sleep apnea (adult) (pediatric): Secondary | ICD-10-CM

## 2024-08-05 DIAGNOSIS — R7989 Other specified abnormal findings of blood chemistry: Secondary | ICD-10-CM | POA: Diagnosis not present

## 2024-08-05 DIAGNOSIS — I10 Essential (primary) hypertension: Secondary | ICD-10-CM | POA: Diagnosis not present

## 2024-08-05 DIAGNOSIS — F4321 Adjustment disorder with depressed mood: Secondary | ICD-10-CM | POA: Diagnosis not present

## 2024-08-05 DIAGNOSIS — E538 Deficiency of other specified B group vitamins: Secondary | ICD-10-CM

## 2024-08-05 DIAGNOSIS — F419 Anxiety disorder, unspecified: Secondary | ICD-10-CM | POA: Diagnosis not present

## 2024-08-05 DIAGNOSIS — Z1231 Encounter for screening mammogram for malignant neoplasm of breast: Secondary | ICD-10-CM

## 2024-08-05 DIAGNOSIS — D649 Anemia, unspecified: Secondary | ICD-10-CM | POA: Insufficient documentation

## 2024-08-05 DIAGNOSIS — R7303 Prediabetes: Secondary | ICD-10-CM | POA: Diagnosis not present

## 2024-08-05 DIAGNOSIS — E559 Vitamin D deficiency, unspecified: Secondary | ICD-10-CM

## 2024-08-05 LAB — IRON: Iron: 53 ug/dL (ref 42–145)

## 2024-08-05 LAB — TSH: TSH: 0.03 u[IU]/mL — ABNORMAL LOW (ref 0.35–5.50)

## 2024-08-05 LAB — CBC WITH DIFFERENTIAL/PLATELET
Basophils Absolute: 0.1 K/uL (ref 0.0–0.1)
Basophils Relative: 0.7 % (ref 0.0–3.0)
Eosinophils Absolute: 0.1 K/uL (ref 0.0–0.7)
Eosinophils Relative: 1 % (ref 0.0–5.0)
HCT: 37.7 % (ref 36.0–46.0)
Hemoglobin: 12.9 g/dL (ref 12.0–15.0)
Lymphocytes Relative: 25.6 % (ref 12.0–46.0)
Lymphs Abs: 2.3 K/uL (ref 0.7–4.0)
MCHC: 34.2 g/dL (ref 30.0–36.0)
MCV: 89.3 fl (ref 78.0–100.0)
Monocytes Absolute: 0.6 K/uL (ref 0.1–1.0)
Monocytes Relative: 7.1 % (ref 3.0–12.0)
Neutro Abs: 5.9 K/uL (ref 1.4–7.7)
Neutrophils Relative %: 65.6 % (ref 43.0–77.0)
Platelets: 513 K/uL — ABNORMAL HIGH (ref 150.0–400.0)
RBC: 4.22 Mil/uL (ref 3.87–5.11)
RDW: 13.7 % (ref 11.5–15.5)
WBC: 9 K/uL (ref 4.0–10.5)

## 2024-08-05 LAB — T4, FREE: Free T4: 1.38 ng/dL (ref 0.60–1.60)

## 2024-08-05 LAB — T3, FREE: T3, Free: 3.7 pg/mL (ref 2.3–4.2)

## 2024-08-05 LAB — FERRITIN: Ferritin: 56.8 ng/mL (ref 10.0–291.0)

## 2024-08-05 NOTE — Assessment & Plan Note (Signed)
Continues cpap.  

## 2024-08-05 NOTE — Patient Instructions (Addendum)
 Get back to walking when you are up to it   Add some strength training to your routine, this is important for bone and brain health and can reduce your risk of falls and help your body use insulin properly and regulate weight  Light weights, exercise bands , and internet videos are a good way to start  Yoga (chair or regular), machines , floor exercises or a gym with machines are also good options     Try increasing your buspar  to 15 mg three times daily with what you have  Let us  know if that helps  More labs for blood count and thyroid  now We will be in touch when we can   Check with new insurance about prolia     You have an order for:  [x]   3D Mammogram  [x]   Bone Density     Please call for appointment:   [x]   [facility]  [address] [place] [address]  [phone]  []   [facility])   [address]  [place], [place] [address]  [phone]  []   [facility]      [address] [place], [place]        [phone]         []   [facility]  [address] [place], [place]  [phone]  []  [facility]   [address]   [place], [place] [address]  [phone]  []  [facility]  [address] [place], [place] [address] [phone]    Make sure to wear two piece clothing  No lotions powders or deodorants the day of the appointment Make sure to bring picture ID and insurance card.  Bring list of medications you are currently taking including any supplements.   Schedule your screening mammogram through MyChart!   Select [facility] imaging sites can now be scheduled through MyChart.  Log into your MyChart account.  Go to Visit (or Appointments if  on mobile App) --> Schedule an  Appointment  Under  Select a Reason for Visit choose the Mammogram  Screening option.  Complete the pre-visit questions  and select the time and place that  best fits your schedule

## 2024-08-05 NOTE — Assessment & Plan Note (Signed)
 Dexa ordered  Pt will schedule  Continues prolia  if affordable/ otherwise re eval options   Discussed fall prevention, supplements and exercise for bone density   TSH is low (new)- re checking

## 2024-08-05 NOTE — Assessment & Plan Note (Signed)
 Watching B12 and D Lab Results  Component Value Date   VITAMINB12 902 07/28/2024   Last vitamin D  Lab Results  Component Value Date   VD25OH 69.86 07/28/2024

## 2024-08-05 NOTE — Assessment & Plan Note (Signed)
 Lab Results  Component Value Date   HGBA1C 5.6 07/28/2024   disc imp of low glycemic diet and wt loss to prevent DM2

## 2024-08-05 NOTE — Assessment & Plan Note (Signed)
 Reviewed health habits including diet and exercise and skin cancer prevention Reviewed appropriate screening tests for age  Also reviewed health mt list, fam hx and immunization status , as well as social and family history   See HPI Labs reviewed and ordered Health Maintenance  Topic Date Due   Breast Cancer Screening  06/30/2024   COVID-19 Vaccine (5 - 2025-26 season) 06/04/2025*   Medicare Annual Wellness Visit  05/04/2025   DTaP/Tdap/Td vaccine (3 - Td or Tdap) 06/15/2032   Pneumococcal Vaccine for age over 83  Completed   Flu Shot  Completed   Osteoporosis screening with Bone Density Scan  Completed   Hepatitis C Screening  Completed   Zoster (Shingles) Vaccine  Completed   Meningitis B Vaccine  Aged Out   Colon Cancer Screening  Discontinued  *Topic was postponed. The date shown is not the original due date.    Mammogram and dexa ordered  Discussed fall prevention, supplements and exercise for bone density  PHQ 9 on treatment , will re visit this

## 2024-08-05 NOTE — Assessment & Plan Note (Signed)
 Well controlled on omeprazole  20 mg bid if no missed doses  Pepcid  20 at bedtime Encouraged to avoid triggers

## 2024-08-05 NOTE — Assessment & Plan Note (Signed)
 Hb of 11.3 in setting of recent strep throat  Also plt 412  Re check today  No GI symptoms or changes

## 2024-08-05 NOTE — Assessment & Plan Note (Signed)
°  bp in fair control at this time  BP Readings from Last 1 Encounters:  08/05/24 136/84   No changes needed Most recent labs reviewed  Disc lifstyle change with low sodium diet and exercise  Olmesartan  40 mg daily  Metoprolol  xl 50 mg daily   Unable to take some other classes of medication

## 2024-08-05 NOTE — Assessment & Plan Note (Signed)
 Lab Results  Component Value Date   VITAMINB12 902 07/28/2024   Encouraged continued oral supplementation

## 2024-08-05 NOTE — Progress Notes (Signed)
 Subjective:    Patient ID: Adriana Marks, female    DOB: 1945/11/15, 78 y.o.   MRN: [id number]  HPI  Here for health maintenance exam and to review chronic medical problems   Wt Readings from Last 3 Encounters:  08/05/24 167 lb 8 oz (76 kg)  05/04/24 168 lb (76.2 kg)  03/10/24 173 lb 6.4 oz (78.7 kg)   30.88 kg/m  Vitals:   08/05/24 1127  BP: 136/84  Pulse: 76  Temp: 98.9 F (37.2 C)  SpO2: 98%    Immunization History  Administered Date(s) Administered   Fluad Quad(high Dose 65+) 04/30/2022   INFLUENZA, HIGH DOSE SEASONAL PF 05/02/2017, 05/06/2018, 06/07/2020, 06/17/2021, 05/16/2023, 06/06/2024   Influenza,inj,Quad PF,6+ Mos 06/04/2019   Influenza-Unspecified 05/18/2013, 05/11/2016   PFIZER Comirnaty(Gray Top)Covid-19 Tri-Sucrose Vaccine 05/12/2022   PFIZER(Purple Top)SARS-COV-2 Vaccination 10/18/2019, 11/09/2019, 06/18/2020   PNEUMOCOCCAL CONJUGATE-20 05/16/2023   PPD Test 02/02/2013, 04/24/2016, 12/26/2020   Pneumococcal Conjugate-13 09/09/2014   Pneumococcal Polysaccharide-23 11/14/2017   Pneumococcal-Unspecified 08/27/2012   Respiratory Syncytial Virus Vaccine,Recomb Aduvanted(Arexvy) 06/15/2022   Tdap 01/01/2011, 06/15/2022   Zoster Recombinant(Shingrix) 11/20/2021, 03/12/2022   Zoster, Live 08/13/2012    Health Maintenance Due  Topic Date Due   Mammogram  06/30/2024   Feeling better from all the illnesses Strep/ covid and sinus infection  Just finished her antibiotic Tuesday    Mammogram 06/2023- ordering  Self breast exam-no lumps   Gyn health-no problems    Colon cancer screening  Colonoscopy 01/2021   Bone health  Dexa 05/2022 -ordering  Osteoporosis -prolia  was covered and then it was not  New ins in January- hopes to be covered  [employer]  Supplements  Last vitamin D  Lab Results  Component Value Date   VD25OH 69.86 07/28/2024    Exercise  Very little due to recent illnesses  Walking     Mood    08/05/2024    11:31 AM 05/04/2024    3:09 PM 11/26/2023    2:04 PM 11/19/2023   10:00 AM 07/16/2023   11:59 AM  Depression screen PHQ 2/9  Decreased Interest 3 0 1 1 1   Down, Depressed, Hopeless 1 0 1 1 0  PHQ - 2 Score 4 0 2 2 1   Altered sleeping 1  1 1 1   Tired, decreased energy 1  1 1 1   Change in appetite 1  1 1  0  Feeling bad or failure about yourself  0  0 0 0  Trouble concentrating 1  1 1  0  Moving slowly or fidgety/restless 1  1 1  0  Suicidal thoughts 0  0 0 0  PHQ-9 Score 9  7  7  3    Difficult doing work/chores   Not difficult at all  Not difficult at all     Data saved with a previous flowsheet row definition   Adj disorder/dep/anx mood  Sertraline  100 mg daily  Buspar  15 mg bid   HTN bp is stable today  No cp or palpitations or headaches or edema  No side effects to medicines  BP Readings from Last 3 Encounters:  08/05/24 136/84  03/10/24 (!) 154/90  02/10/24 (!) 168/90     Lab Results  Component Value Date   NA 136 07/28/2024   K 4.0 07/28/2024   CO2 29 07/28/2024   GLUCOSE 100 (H) 07/28/2024   BUN 12 07/28/2024   CREATININE 0.64 07/28/2024   CALCIUM  9.8 07/28/2024   GFR 85.72 07/28/2024   GFRNONAA >60 03/12/2024  Olmesartan  40 mg daily  Metoprolol  xl 50 mg daily   No thiazide- low na Amlodipine  caused ha   GERD Omeprazole  20 mg bid Pepcid  Is well controlled (except during antibiotic)  Lab Results  Component Value Date   VITAMINB12 902 07/28/2024    Hyperlipidemia Lab Results  Component Value Date   CHOL 97 07/28/2024   CHOL 181 05/13/2023   CHOL 158 05/03/2022   Lab Results  Component Value Date   HDL 29.80 (L) 07/28/2024   HDL 67.60 05/13/2023   HDL 63.00 05/03/2022   Lab Results  Component Value Date   LDLCALC 54 07/28/2024   LDLCALC 95 05/13/2023   LDLCALC 76 05/03/2022   Lab Results  Component Value Date   TRIG 67.0 07/28/2024   TRIG 93.0 05/13/2023   TRIG 96.0 05/03/2022   Lab Results  Component Value Date   CHOLHDL 3  07/28/2024   CHOLHDL 3 05/13/2023   CHOLHDL 3 05/03/2022   No results found for: LDLDIRECT Simvastatin  20 mg daily  LDL is down -pleased   HDL was down- possible due to illness and inactivity   Lab Results  Component Value Date   ALT 14 07/28/2024   AST 16 07/28/2024   ALKPHOS 52 07/28/2024   BILITOT 0.5 07/28/2024    Prediabetes Lab Results  Component Value Date   HGBA1C 5.6 07/28/2024   HGBA1C 5.8 05/13/2023   HGBA1C 5.7 05/03/2022   Eating less  More leafy greens   Has something sweet once per day  Off ice cream now    New abn thyroid  lab Lab Results  Component Value Date   TSH 0.05 (L) 07/28/2024   No change in appearance in neck (except her lymph nodes may have been swollen)    No history of thyroid  problems  Niece has thyroid  problem   Is anxious  Night sweats     New anemia   Lab Results  Component Value Date   WBC 6.9 07/28/2024   HGB 11.3 (L) 07/28/2024   HCT 32.8 (L) 07/28/2024   MCV 89.8 07/28/2024   PLT 412.0 (H) 07/28/2024   Had strep throat recently  Before that covid and sinus infection    Has not donated blood  No blood in stool No GI symptoms  (loose stool from antibiotic)       Patient Active Problem List   Diagnosis Date Noted   Low TSH level 08/05/2024   Elevated platelet count 08/05/2024   Anemia 08/05/2024   DOE (dyspnea on exertion) 03/11/2024   Murmur 02/10/2024   Palpitations 02/10/2024   Encounter for screening mammogram for breast cancer 05/20/2023   B12 deficiency 05/20/2023   Current use of proton pump inhibitor 05/12/2023   IBS (irritable bowel syndrome) 01/06/2023   Numbness and tingling in both hands 07/05/2022   OSA (obstructive sleep apnea) 10/05/2021   Chest discomfort 08/01/2021   Sleep apnea 01/29/2021   Chronic headaches 12/07/2020   Hyponatremia 04/12/2020   Screening for colon cancer 10/29/2019   Stress reaction 08/31/2019   Screening mammogram for breast cancer 12/08/2017   Estrogen  deficiency 12/04/2016   Grief reaction 09/09/2014   Adjustment disorder with depressed mood 09/09/2014   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   Prediabetes 08/12/2012   Hyperlipidemia 01/01/2011   Hypertension 01/01/2011   GERD (gastroesophageal reflux disease) 01/01/2011   Routine general medical examination at a health care facility 01/01/2011   Vitamin D  deficiency disease 01/01/2011   Osteoporosis 01/01/2011  Anxiety 01/01/2011   Allergic rhinitis 01/01/2011   Rosacea 01/01/2011   Disorder of bone and cartilage 01/01/2011   Past Medical History:  Diagnosis Date   Allergy    Anemia 08/05/2024   Anxiety    Arthritis Year ago   Bronchitis    Cataract    Colon polyps    Depression    GERD (gastroesophageal reflux disease)    Heart murmur    History of chicken pox    Hx: UTI (urinary tract infection)    Hyperlipidemia    Hypertension    Migraines    OSA on CPAP    Oxygen deficiency    PONV (postoperative nausea and vomiting)    Rosacea    Sleep apnea    uses cpap every night    Past Surgical History:  Procedure Laterality Date   CATARACT EXTRACTION W/PHACO Right 10/01/2023   Procedure: CATARACT EXTRACTION PHACO AND INTRAOCULAR LENS PLACEMENT (IOC) RIGHT 7.97 00:56.0;  Surgeon: Mittie Gaskin, MD;  Location: [facility];  Service: Ophthalmology;  Laterality: Right;   CATARACT EXTRACTION W/PHACO Left 10/27/2023   Procedure: CATARACT EXTRACTION PHACO AND INTRAOCULAR LENS PLACEMENT (IOC) LEFT 11.32 00:59.6;  Surgeon: Mittie Gaskin, MD;  Location: [facility];  Service: Ophthalmology;  Laterality: Left;   COLONOSCOPY WITH PROPOFOL  N/A 01/25/2021   Procedure: COLONOSCOPY WITH PROPOFOL ;  Surgeon: Unk Corinn Skiff, MD;  Location: [facility];  Service: Gastroenterology;  Laterality: N/A;   COSMETIC SURGERY  1976   DNC     FOOT SURGERY     Social History[1] Family History  Problem Relation Age of Onset   Cerebral  aneurysm Mother    Asthma Mother        Deceased   Heart disease Mother    Heart attack Father 23   Hypertension Father    Early death Father    Heart disease Father    Breast cancer Sister    Cancer Sister    Anxiety disorder Brother    Depression Brother    Asthma Brother    Breast cancer Paternal Aunt    Alcohol abuse Paternal Uncle    Stroke Paternal Grandmother    Heart disease Paternal Grandfather    Hypertension Paternal Grandfather    Diabetes Other    Bladder Cancer Neg Hx    Kidney cancer Neg Hx    Allergies[2] Medications Ordered Prior to Encounter[3]  Review of Systems  Constitutional:  Positive for fatigue. Negative for activity change, appetite change, fever and unexpected weight change.  HENT:  Negative for congestion, ear pain, rhinorrhea, sinus pressure and sore throat.   Eyes:  Negative for pain, redness and visual disturbance.  Respiratory:  Negative for cough, shortness of breath and wheezing.   Cardiovascular:  Negative for chest pain and palpitations.  Gastrointestinal:  Negative for abdominal pain, blood in stool, constipation and diarrhea.  Endocrine: Positive for heat intolerance. Negative for polydipsia and polyuria.  Genitourinary:  Negative for dysuria, frequency and urgency.  Musculoskeletal:  Negative for arthralgias, back pain and myalgias.  Skin:  Negative for pallor and rash.  Allergic/Immunologic: Negative for environmental allergies.  Neurological:  Negative for dizziness, syncope and headaches.  Hematological:  Negative for adenopathy. Does not bruise/bleed easily.  Psychiatric/Behavioral:  Positive for dysphoric mood and sleep disturbance. The patient is nervous/anxious.        Objective:   Physical Exam Constitutional:      General: She is not in acute distress.    Appearance: Normal appearance. She  is well-developed. She is obese. She is not ill-appearing or diaphoretic.  HENT:     Head: Normocephalic and atraumatic.     Right  Ear: Tympanic membrane, ear canal and external ear normal.     Left Ear: Tympanic membrane, ear canal and external ear normal.     Nose: Nose normal. No congestion.     Mouth/Throat:     Mouth: Mucous membranes are moist.     Pharynx: Oropharynx is clear. No posterior oropharyngeal erythema.  Eyes:     General: No scleral icterus.    Extraocular Movements: Extraocular movements intact.     Conjunctiva/sclera: Conjunctivae normal.     Pupils: Pupils are equal, round, and reactive to light.  Neck:     Thyroid : No thyromegaly.     Vascular: No carotid bruit or JVD.  Cardiovascular:     Rate and Rhythm: Normal rate and regular rhythm.     Pulses: Normal pulses.     Heart sounds: Normal heart sounds.     No gallop.  Pulmonary:     Effort: Pulmonary effort is normal. No respiratory distress.     Breath sounds: Normal breath sounds. No wheezing.     Comments: Good air exch Chest:     Chest wall: No tenderness.  Abdominal:     General: Bowel sounds are normal. There is no distension or abdominal bruit.     Palpations: Abdomen is soft. There is no mass.     Tenderness: There is no abdominal tenderness.     Hernia: No hernia is present.  Genitourinary:    Comments: Breast exam: No mass, nodules, thickening, tenderness, bulging, retraction, inflamation, nipple discharge or skin changes noted.  No axillary or clavicular LA.     Musculoskeletal:        General: No tenderness. Normal range of motion.     Cervical back: Normal range of motion and neck supple. No rigidity. No muscular tenderness.     Right lower leg: No edema.     Left lower leg: No edema.     Comments: No kyphosis   Lymphadenopathy:     Cervical: No cervical adenopathy.  Skin:    General: Skin is warm and dry.     Coloration: Skin is not pale.     Findings: No erythema or rash.     Comments: Solar lentigines diffusely Scattered sks   Neurological:     Mental Status: She is alert. Mental status is at baseline.      Cranial Nerves: No cranial nerve deficit.     Motor: No abnormal muscle tone.     Coordination: Coordination normal.     Gait: Gait normal.     Deep Tendon Reflexes: Reflexes are normal and symmetric. Reflexes normal.  Psychiatric:        Mood and Affect: Mood is anxious.        Cognition and Memory: Cognition and memory normal.     Comments: Candidly discusses symptoms and stressors             Assessment & Plan:   Problem List Items Addressed This Visit       Cardiovascular and Mediastinum   Hypertension    bp in fair control at this time  BP Readings from Last 1 Encounters:  08/05/24 136/84   No changes needed Most recent labs reviewed  Disc lifstyle change with low sodium diet and exercise  Olmesartan  40 mg daily  Metoprolol  xl 50 mg daily  Unable to take some other classes of medication         Respiratory   OSA (obstructive sleep apnea)   Continues cpap        Digestive   GERD (gastroesophageal reflux disease)   Well controlled on omeprazole  20 mg bid if no missed doses  Pepcid  20 at bedtime Encouraged to avoid triggers         Musculoskeletal and Integument   Osteoporosis   Dexa ordered  Pt will schedule  Continues prolia  if affordable/ otherwise re eval options   Discussed fall prevention, supplements and exercise for bone density   TSH is low (new)- re checking       Relevant Orders   DG Bone Density     Other   Vitamin D  deficiency disease   Last vitamin D  Lab Results  Component Value Date   VD25OH 69.86 07/28/2024   Vitamin D  level is therapeutic with current supplementation Disc importance of this to bone and overall health       Routine general medical examination at a health care facility - Primary   Reviewed health habits including diet and exercise and skin cancer prevention Reviewed appropriate screening tests for age  Also reviewed health mt list, fam hx and immunization status , as well as social and family history    See HPI Labs reviewed and ordered Health Maintenance  Topic Date Due   Breast Cancer Screening  06/30/2024   COVID-19 Vaccine (5 - 2025-26 season) 06/04/2025*   Medicare Annual Wellness Visit  05/04/2025   DTaP/Tdap/Td vaccine (3 - Td or Tdap) 06/15/2032   Pneumococcal Vaccine for age over 68  Completed   Flu Shot  Completed   Osteoporosis screening with Bone Density Scan  Completed   Hepatitis C Screening  Completed   Zoster (Shingles) Vaccine  Completed   Meningitis B Vaccine  Aged Out   Colon Cancer Screening  Discontinued  *Topic was postponed. The date shown is not the original due date.    Mammogram and dexa ordered  Discussed fall prevention, supplements and exercise for bone density  PHQ 9 on treatment , will re visit this       Prediabetes   Lab Results  Component Value Date   HGBA1C 5.6 07/28/2024   disc imp of low glycemic diet and wt loss to prevent DM2       Low TSH level   TSH 0.05  New  More anxious  Also feels warm/sweats  No obvious goiter  Re check thyroid  prof today Likely ref to endocrinology      Relevant Orders   TSH   T4, Free   T3, Free   T3 Uptake   Hyperlipidemia   Disc goals for lipids and reasons to control them Rev last labs with pt Rev low sat fat diet in detail Good LDL but HDL is down (after inactivity) Will continue simvastatin  20 mg daily   Continue to monitor       Encounter for screening mammogram for breast cancer   Mammogram ordered  Pt will call to schedule       Relevant Orders   MM 3D SCREENING MAMMOGRAM BILATERAL BREAST   Elevated platelet count   This was around time of strep throat  Feeling better  Lab today   No clotting changes clinically      Relevant Orders   CBC with Differential/Platelet   Current use of proton pump inhibitor   Watching B12 and D Lab  Results  Component Value Date   VITAMINB12 902 07/28/2024   Last vitamin D  Lab Results  Component Value Date   VD25OH 69.86 07/28/2024          B12 deficiency   Lab Results  Component Value Date   VITAMINB12 902 07/28/2024   Encouraged continued oral supplementation       Anxiety   Worsened recently  ? If related to the low TSH Will try increase buspar  to 15 mg tid Pending more thyroid  labs Encouraged to get back to physical activity after recent illnesses       Anemia   Hb of 11.3 in setting of recent strep throat  Also plt 412  Re check today  No GI symptoms or changes       Relevant Orders   CBC with Differential/Platelet   Ferritin   Iron   Adjustment disorder with depressed mood   More anxious lately  ? If from likely hyperthyroidism (more labs today) Will try increase buspar  to 15 mg tid as tolerated  Continue sertraline  100 mg daily (option to increase also)   Reviewed stressors/ coping techniques/symptoms/ support sources/ tx options and side effects in detail today          [1]  Social History Tobacco Use   Smoking status: Former    Current packs/day: 0.00    Average packs/day: 0.5 packs/day for 4.0 years (2.0 ttl pk-yrs)    Types: Cigarettes    Start date: 60    Quit date: 1980    Years since quitting: 45.9   Smokeless tobacco: Never  Vaping Use   Vaping status: Never Used  Substance Use Topics   Alcohol use: Never    Comment: wine occasional   Drug use: Never  [2]  Allergies Allergen Reactions   Shellfish Allergy Anaphylaxis    Sob/ swelling   Alendronate      Nausea  Headache    Amlodipine      headache   Thiazide-Type Diuretics     Hyponatremia    [3]  Current Outpatient Medications on File Prior to Visit  Medication Sig Dispense Refill   aspirin 81 MG tablet Take 81 mg by mouth daily.     busPIRone  (BUSPAR ) 15 MG tablet TAKE 1 TABLET BY MOUTH 2 TIMES DAILY. 180 tablet 0   Cholecalciferol (VITAMIN D3) 250 MCG (10000 UT) capsule Take 10,000 Units by mouth daily.     cyanocobalamin  (VITAMIN B12) 1000 MCG tablet Take 1,000 mcg by mouth daily.     Docusate  Calcium  (STOOL SOFTENER PO) Take 1 capsule by mouth daily.     famotidine  (PEPCID ) 20 MG tablet TAKE 1 TABLET BY MOUTH EVERYDAY AT BEDTIME 90 tablet 0   loratadine (CLARITIN) 10 MG tablet Take 10 mg by mouth daily.     metroNIDAZOLE  (METROGEL ) 1 % gel Apply topically 2 (two) times daily. 180 g 3   nebivolol  (BYSTOLIC ) 10 MG tablet Take 1 tablet (10 mg total) by mouth daily. 90 tablet 3   olmesartan  (BENICAR ) 40 MG tablet TAKE 1 TABLET BY MOUTH EVERY DAY 90 tablet 1   omeprazole  (PRILOSEC) 20 MG capsule TAKE 1 CAPSULE BY MOUTH TWICE A DAY 180 capsule 1   sertraline  (ZOLOFT ) 50 MG tablet TAKE 2 TABLETS BY MOUTH EVERY DAY 180 tablet 0   simvastatin  (ZOCOR ) 20 MG tablet TAKE 1 TABLET BY MOUTH DAILY AT 6 PM. 90 tablet 1   No current facility-administered medications on file prior to visit.

## 2024-08-05 NOTE — Assessment & Plan Note (Signed)
 TSH 0.05  New  More anxious  Also feels warm/sweats  No obvious goiter  Re check thyroid  prof today Likely ref to endocrinology

## 2024-08-05 NOTE — Assessment & Plan Note (Signed)
 Mammogram ordered Pt will call to schedule

## 2024-08-05 NOTE — Assessment & Plan Note (Signed)
 Last vitamin D  Lab Results  Component Value Date   VD25OH 69.86 07/28/2024   Vitamin D  level is therapeutic with current supplementation Disc importance of this to bone and overall health

## 2024-08-05 NOTE — Assessment & Plan Note (Signed)
 This was around time of strep throat  Feeling better  Lab today   No clotting changes clinically

## 2024-08-05 NOTE — Assessment & Plan Note (Signed)
 Worsened recently  ? If related to the low TSH Will try increase buspar  to 15 mg tid Pending more thyroid  labs Encouraged to get back to physical activity after recent illnesses

## 2024-08-05 NOTE — Assessment & Plan Note (Signed)
 More anxious lately  ? If from likely hyperthyroidism (more labs today) Will try increase buspar  to 15 mg tid as tolerated  Continue sertraline  100 mg daily (option to increase also)   Reviewed stressors/ coping techniques/symptoms/ support sources/ tx options and side effects in detail today

## 2024-08-05 NOTE — Assessment & Plan Note (Signed)
 Disc goals for lipids and reasons to control them Rev last labs with pt Rev low sat fat diet in detail Good LDL but HDL is down (after inactivity) Will continue simvastatin  20 mg daily   Continue to monitor

## 2024-08-06 LAB — T3 UPTAKE: T3 Uptake: 30 % (ref 22–35)

## 2024-08-08 ENCOUNTER — Other Ambulatory Visit: Payer: Self-pay | Admitting: Family Medicine

## 2024-08-11 ENCOUNTER — Other Ambulatory Visit: Payer: Self-pay | Admitting: Family Medicine

## 2024-08-20 ENCOUNTER — Encounter: Payer: Self-pay | Admitting: Family Medicine

## 2024-08-22 MED ORDER — ALENDRONATE SODIUM 70 MG PO TABS: 70.0000 mg | ORAL_TABLET | ORAL | 5 refills | Status: DC

## 2024-08-23 ENCOUNTER — Encounter: Payer: Self-pay | Admitting: Family Medicine

## 2024-08-23 DIAGNOSIS — E0590 Thyrotoxicosis, unspecified without thyrotoxic crisis or storm: Secondary | ICD-10-CM | POA: Insufficient documentation

## 2024-08-31 ENCOUNTER — Other Ambulatory Visit: Payer: Self-pay | Admitting: Family Medicine

## 2024-09-01 ENCOUNTER — Emergency Department
Admission: EM | Admit: 2024-09-01 | Discharge: 2024-09-01 | Disposition: A | Discharge status: Home / Self Care | Attending: Emergency Medicine | Admitting: Emergency Medicine

## 2024-09-01 ENCOUNTER — Telehealth: Payer: Self-pay | Admitting: *Deleted

## 2024-09-01 ENCOUNTER — Encounter: Payer: Self-pay | Admitting: Internal Medicine

## 2024-09-01 ENCOUNTER — Other Ambulatory Visit: Payer: Self-pay

## 2024-09-01 ENCOUNTER — Ambulatory Visit: Attending: Internal Medicine | Admitting: Internal Medicine

## 2024-09-01 VITALS — BP 214/100 | HR 67 | Ht 61.5 in | Wt 171.2 lb

## 2024-09-01 DIAGNOSIS — I160 Hypertensive urgency: Secondary | ICD-10-CM

## 2024-09-01 DIAGNOSIS — I10 Essential (primary) hypertension: Secondary | ICD-10-CM | POA: Diagnosis not present

## 2024-09-01 DIAGNOSIS — R0602 Shortness of breath: Secondary | ICD-10-CM | POA: Diagnosis present

## 2024-09-01 DIAGNOSIS — M810 Age-related osteoporosis without current pathological fracture: Secondary | ICD-10-CM

## 2024-09-01 LAB — CBC WITH DIFFERENTIAL/PLATELET
Abs Immature Granulocytes: 0.03 K/uL (ref 0.00–0.07)
Basophils Absolute: 0.1 K/uL (ref 0.0–0.1)
Basophils Relative: 1 %
Eosinophils Absolute: 0.2 K/uL (ref 0.0–0.5)
Eosinophils Relative: 2 %
HCT: 39.1 % (ref 36.0–46.0)
Hemoglobin: 13.3 g/dL (ref 12.0–15.0)
Immature Granulocytes: 0 %
Lymphocytes Relative: 27 %
Lymphs Abs: 2.2 K/uL (ref 0.7–4.0)
MCH: 30.2 pg (ref 26.0–34.0)
MCHC: 34 g/dL (ref 30.0–36.0)
MCV: 88.9 fL (ref 80.0–100.0)
Monocytes Absolute: 0.6 K/uL (ref 0.1–1.0)
Monocytes Relative: 7 %
Neutro Abs: 5.3 K/uL (ref 1.7–7.7)
Neutrophils Relative %: 63 %
Platelets: 273 K/uL (ref 150–400)
RBC: 4.4 MIL/uL (ref 3.87–5.11)
RDW: 13 % (ref 11.5–15.5)
WBC: 8.5 K/uL (ref 4.0–10.5)
nRBC: 0 % (ref 0.0–0.2)

## 2024-09-01 LAB — COMPREHENSIVE METABOLIC PANEL WITH GFR
ALT: 13 U/L (ref 0–44)
AST: 21 U/L (ref 15–41)
Albumin: 4.5 g/dL (ref 3.5–5.0)
Alkaline Phosphatase: 62 U/L (ref 38–126)
Anion gap: 13 (ref 5–15)
BUN: 10 mg/dL (ref 8–23)
CO2: 23 mmol/L (ref 22–32)
Calcium: 9.7 mg/dL (ref 8.9–10.3)
Chloride: 98 mmol/L (ref 98–111)
Creatinine, Ser: 0.8 mg/dL (ref 0.44–1.00)
GFR, Estimated: >60 mL/min
Glucose, Bld: 98 mg/dL (ref 70–99)
Potassium: 4 mmol/L (ref 3.5–5.1)
Sodium: 134 mmol/L — ABNORMAL LOW (ref 135–145)
Total Bilirubin: 0.2 mg/dL (ref 0.0–1.2)
Total Protein: 7.8 g/dL (ref 6.5–8.1)

## 2024-09-01 MED ORDER — HYDRALAZINE HCL 10 MG PO TABS: 10.0000 mg | ORAL_TABLET | Freq: Three times a day (TID) | ORAL | 0 refills | Status: DC

## 2024-09-01 MED ORDER — DENOSUMAB 60 MG/ML ~~LOC~~ SOSY: 60.0000 mg | PREFILLED_SYRINGE | Freq: Once | SUBCUTANEOUS | Status: AC

## 2024-09-01 MED ORDER — HYDRALAZINE HCL 20 MG/ML IJ SOLN
10.0000 mg | Freq: Once | INTRAMUSCULAR | Status: AC
  Administered 2024-09-01: 10 mg via INTRAVENOUS
  Filled 2024-09-01: qty 1

## 2024-09-01 NOTE — Addendum Note (Signed)
 Addended by: ALBINO SHAVER C on: 09/01/2024 04:26 PM   Modules accepted: Orders

## 2024-09-01 NOTE — Telephone Encounter (Signed)
 Spoke with pt. She currently has new insurance and would like to see what her out of pocket cost is for prolia . Pt was placed on fosamax  in place of prolia  due to cost.  Dr. Randeen - please advise if it's okay to restart prolia .

## 2024-09-01 NOTE — Telephone Encounter (Signed)
 Pt was contacted on 02/04/24 to schedule her next prolia  injection and I was told by the pt that she could not afford to continue doing prolia . I will reach out to the pt and see if things have changed.

## 2024-09-01 NOTE — Telephone Encounter (Signed)
 Routing to PA dpt and Prolia  dpt

## 2024-09-01 NOTE — ED Provider Notes (Signed)
 "  [facility] Provider Note    Event Date/Time   First MD Initiated Contact with Patient 09/01/24 1720     (approximate)   History   Hypertension   HPI  Adriana Marks is a 79 y.o. female with a history of hypertension, hyperlipidemia, heart murmur, GERD, OSA, migraines, and depression who presents with elevated blood pressure.  The patient states that she has had some feeling of being hot but denies other acute symptoms.  She has been compliant with her blood pressure medications.  However, today when she went to the cardiologist office her blood pressure was over 200 so she was sent to the ED for labs.  She denies any chest pain, dizziness or lightheadedness, or leg swelling.  She reports some mild intermittent shortness of breath which is not new.  I reviewed the past medical records.  The patient was seen by Dr. Mady from cardiology today for follow-up of hypertension and her heart murmur.  She has a history of intolerance to several antihypertensive agents.  She had a workup for secondary hypertension.   Physical Exam   Triage Vital Signs: ED Triage Vitals [09/01/24 1714]  Encounter Vitals Group     BP (!) 188/103     Girls Systolic BP Percentile      Girls Diastolic BP Percentile      Boys Systolic BP Percentile      Boys Diastolic BP Percentile      Pulse Rate 63     Resp 18     Temp 98.4 F (36.9 C)     Temp src      SpO2 96 %     Weight      Height      Head Circumference      Peak Flow      Pain Score 0     Pain Loc      Pain Education      Exclude from Growth Chart     Most recent vital signs: Vitals:   09/01/24 2000 09/01/24 2051  BP: 129/69 (!) 159/76  Pulse: 71 83  Resp: (!) 23 18  Temp:  98 F (36.7 C)  SpO2: 99% 100%     General: Alert, well-appearing, no distress.  CV:  Good peripheral perfusion.  Resp:  Normal effort.  Lungs CTAB. Abd:  No distention.  Other:  No peripheral edema.   ED Results / Procedures /  Treatments   Labs (all labs ordered are listed, but only abnormal results are displayed) Labs Reviewed  COMPREHENSIVE METABOLIC PANEL WITH GFR - Abnormal; Notable for the following components:      Result Value   Sodium 134 (*)    All other components within normal limits  CBC WITH DIFFERENTIAL/PLATELET  METANEPHRINES, PLASMA     EKG    RADIOLOGY    PROCEDURES:  Critical Care performed: No  Procedures   MEDICATIONS ORDERED IN ED: Medications  hydrALAZINE  (APRESOLINE ) injection 10 mg (10 mg Intravenous Given 09/01/24 1926)     IMPRESSION / MDM / ASSESSMENT AND PLAN / ED COURSE  I reviewed the triage vital signs and the nursing notes.  79 year old female with PMH as noted above presents for evaluation of a hypertensive urgency.  She is asymptomatic currently.  Exam is unremarkable.  Blood pressure is 188/103 here.  Differential diagnosis includes, but is not limited to, hypertensive urgency, hypertensive emergency, secondary hypertension.  The patient secondary hypertension workup has been unrevealing so far.  Doctor and, who sent her in, wanted lab workup including metanephrines.  Patient's presentation is most consistent with acute complicated illness / injury requiring diagnostic workup.  The patient is on the cardiac monitor to evaluate for evidence of arrhythmia and/or significant heart rate changes.  ----------------------------------------- 8:44 PM on 09/01/2024 -----------------------------------------  Lab workup is unremarkable.  CMP and CBC show no acute findings.  The creatinine is normal.  Metanephrines are pending.  The patient's blood pressure remains significantly elevated so I gave a dose of IV hydralazine  which led to a marked improvement.  Reading Dr. Ulysses note from earlier, he was considering oral hydralazine  as one of the possible medications to add to the patient's blood pressure regimen.  I have the prescribed oral hydralazine .  At this time  there is no indication for inpatient admission.  There is no evidence of end organ dysfunction.  The patient is stable for discharge home.  I counseled her on the results of workup and plan of care and answered all of her questions.  I gave strict return precautions, and she expressed understanding.   FINAL CLINICAL IMPRESSION(S) / ED DIAGNOSES   Final diagnoses:  Hypertensive urgency     Rx / DC Orders   ED Discharge Orders          Ordered    hydrALAZINE  (APRESOLINE ) 10 MG tablet  3 times daily        09/01/24 2044             Note:  This document was prepared using Dragon voice recognition software and may include unintentional dictation errors.    Jacolyn Pae, MD 09/01/24 2146  "

## 2024-09-01 NOTE — Telephone Encounter (Signed)
 Copied from CRM [id number]. Topic: Clinical - Medication Prior Auth >> Sep 01, 2024  8:40 AM Mesmerise C wrote: Reason for CRM: Patient switched to [facility] and stated omeprazole  (PRILOSEC) 20 MG capsule and famotidine  (PEPCID ) 20 MG tablet will require a prior authorization, also needs a pre-authorization sent to insurance for prolia  so it won't be so expensive states can call [phone] for the insurance the authorizations

## 2024-09-01 NOTE — Progress Notes (Signed)
 " Cardiology Office Note:  .   Date:  09/01/2024  ID:  TASHIRA TORRE, DOB 01-29-1946, MRN [id number] PCP: Randeen Laine LABOR, MD  [facility] Providers Cardiologist:  Lonni Hanson, MD     History of Present Illness: .   Adriana Marks is a 79 y.o. female with history of hypertension, hyperlipidemia, heart murmur, GERD, obstructive sleep apnea, migraine headaches, and depression/anxiety, who presents for follow-up of hypertension and heart murmur.  I met her in July, at which time she noted a long history of difficult to manage blood pressure.  This had been complicated by intolerance to several agents including amlodipine  and HCTZ.  We agreed to switch from metoprolol  to Nebivolol  and perform workup for secondary hypertension.  Renal artery Doppler showed mild bilateral renal artery stenosis (1-59%).  Serum aldosterone/plasma renin activity ratio was also normal.  Echocardiogram showed normal LVEF with aortic sclerosis and mild MR.  Today, Ms. Zent reports that she has been feeling well although she notes that her blood pressure has been higher recently.  She attributes some of this to dietary indiscretion with more sodium and sugar intake over the holidays.  She also had several infections in November and December, including COVID-19 and sinus infection treated with antibiotics.  She notes that some of her blood work drawn last month may have been abnormal on account of this.  She denies any chest pain, shortness of breath, palpitations, lightheadedness, edema, headaches, and focal weakness/numbness.  ROS: See HPI  Studies Reviewed: SABRA       TTE (04/21/2024): Normal LV size and wall thickness.  LVEF 60-65% with normal wall motion and grade 1 diastolic dysfunction.  GLS -19.6%.  Normal RV size and function.  Normal biatrial size.  No pericardial effusion.  Mildly thickened mitral valve leaflets with mild regurgitation.  Aortic sclerosis without stenosis.  Normal CVP.  Renal artery Doppler  (03/22/2024): Mild bilateral renal artery stenosis (1-59%).  Risk Assessment/Calculations:     HYPERTENSION CONTROL Vitals:   09/01/24 1617 09/01/24 1655  BP: (!) 210/90 (!) 214/100    The patient's blood pressure is elevated above target today.  In order to address the patient's elevated BP: -- (Referred to ED in the setting of hypertensive urgency)          Physical Exam:   VS:  BP (!) 214/100   Pulse 67   Ht 5' 1.5 (1.562 m)   Wt 171 lb 3.2 oz (77.7 kg)   LMP 08/26/1997   SpO2 97%   BMI 31.82 kg/m    Wt Readings from Last 3 Encounters:  09/01/24 171 lb 3.2 oz (77.7 kg)  08/05/24 167 lb 8 oz (76 kg)  05/04/24 168 lb (76.2 kg)    General:  NAD. Neck: No JVD or HJR. Lungs: Clear to auscultation bilaterally without wheezes or crackles. Heart: Regular rate and rhythm with 1/6 systolic murmur.  No rubs or gallops. Abdomen: Soft, nontender, nondistended. Extremities: No lower extremity edema.  ASSESSMENT AND PLAN: .    Hypertensive urgency: Ms. Kuznicki notes that her blood pressures have been trending up recently.  She is severely hypertensive in the office today with readings up to 220/100.  Though she is asymptomatic, I believe that she needs continued monitoring and urgent management of her uncontrolled hypertension.  We will therefore refer her to the ED.  I recommend continuation of current doses of Nebivolol  and olmesartan .  Long-term, addition of spironolactone  should be considered given that thiazide diuretics were complicated  by hyponatremia.  We will need to continue monitoring her sodium and potassium closely if spironolactone  is added.  Alternatively, we could initiate hydralazine  though 3 times daily dosing makes this less appealing.  Thus far, secondary hypertension workup has been unrevealing with only mild bilateral RAS and normal serum aldosterone and plasma renin activity.  In the setting of hypertension, serum metanephrines should be checked with labs in the ED  today.    Dispo: Return to clinic in 1 to 2 weeks  Signed, Lonni Hanson, MD   "

## 2024-09-01 NOTE — Patient Instructions (Signed)
 Medication Instructions:  Your physician recommends that you continue on your current medications as directed. Please refer to the Current Medication list given to you today.    *If you need a refill on your cardiac medications before your next appointment, please call your pharmacy*  Lab Work: No labs ordered today    Testing/Procedures: No test ordered today   Follow-Up: At [facility], you and your health needs are our priority.  As part of our continuing mission to provide you with exceptional heart care, our providers are all part of one team.  This team includes your primary Cardiologist (physician) and Advanced Practice Providers or APPs (Physician Assistants and Nurse Practitioners) who all work together to provide you with the care you need, when you need it.  Your next appointment:   1-2 week  Provider:   You may see Lonni Hanson, MD or one of the following Advanced Practice Providers on your designated Care Team:   Lonni Meager, NP Lesley Maffucci, PA-C Bernardino Bring, PA-C Cadence [place], PA-C Tylene Lunch, NP Barnie Hila, NP

## 2024-09-01 NOTE — ED Notes (Signed)
 Dr. Mady spoke with this RN requesting we draw a plasma metanephrine as soon as possible.

## 2024-09-01 NOTE — ED Triage Notes (Signed)
 Pt sent from Cardiology office due to HTN. Pt had readings of 214/100 and 220/100. PT denies dizziness, weakness, or blurred vision.

## 2024-09-01 NOTE — Telephone Encounter (Signed)
 Yes-ok to start  Can someone check on cost or prolia  or the other generic Jubbonti ?' Thanks so much

## 2024-09-01 NOTE — Telephone Encounter (Signed)
 Yes ma'am. Referral has been placed for Prolia  to start benefit verification process. They will let us  know if Adriana Marks will be a cheaper alternative or if her insurance prefers Adriana Marks vs. Prolia .

## 2024-09-01 NOTE — Discharge Instructions (Signed)
 Continue your current blood pressure medications.  Add the hydralazine  that was prescribed today.  This to be taken 3 times a day as prescribed.  Monitor blood pressures at home.  Follow-up with Dr. Mady for further recommendations.  Return to the ER for new, worsening, or persistent severe elevated blood pressure readings, chest pain, difficulty breathing, weakness or lightheadedness, or any other new or worsening symptoms that concern you.

## 2024-09-02 ENCOUNTER — Telehealth: Payer: Self-pay

## 2024-09-02 ENCOUNTER — Other Ambulatory Visit: Payer: Self-pay | Admitting: *Deleted

## 2024-09-02 MED ORDER — SIMVASTATIN 20 MG PO TABS: ORAL_TABLET | ORAL | 3 refills | Status: AC

## 2024-09-02 MED ORDER — BUSPIRONE HCL 15 MG PO TABS: 15.0000 mg | ORAL_TABLET | Freq: Two times a day (BID) | ORAL | 3 refills | Status: AC

## 2024-09-02 MED ORDER — OLMESARTAN MEDOXOMIL 40 MG PO TABS: 40.0000 mg | ORAL_TABLET | Freq: Every day | ORAL | 3 refills | Status: AC

## 2024-09-02 MED ORDER — FAMOTIDINE 20 MG PO TABS: 20.0000 mg | ORAL_TABLET | Freq: Every evening | ORAL | 3 refills | Status: AC

## 2024-09-02 NOTE — Telephone Encounter (Signed)
 Prolia  VOB initiated via [email or web address]  Next Prolia  inj DUE: RESTART   [facility]TI PREFERRED FOR PHARMACY BENEFIT COPAY: $720.96

## 2024-09-03 NOTE — Telephone Encounter (Signed)
 Benefit verification started in new encounter. Will update referral once complete.

## 2024-09-06 LAB — METANEPHRINES, PLASMA
Metanephrine, Free: <25 pg/mL (ref 0.0–88.0)
Normetanephrine, Free: 59.8 pg/mL (ref 0.0–285.2)

## 2024-09-07 ENCOUNTER — Telehealth: Payer: Self-pay | Admitting: *Deleted

## 2024-09-07 ENCOUNTER — Other Ambulatory Visit: Payer: Self-pay | Admitting: Family Medicine

## 2024-09-07 NOTE — Telephone Encounter (Unsigned)
 Copied from CRM [id number]. Topic: Clinical - Medication Refill >> Sep 07, 2024  1:32 PM Taleah C wrote: Medication: zoloft , omeprazole , nebivolol , Alendronate    Has the patient contacted their pharmacy? Yes [facility], PCP has to send the prescriptions  to [facility] since she wants to use them for her refills   This is the patient's preferred pharmacy:   [email or web address] - [facility] - [place], [place] - [address] [place] [address] Phone: [phone] Fax: [phone]  Is this the correct pharmacy for this prescription? Yes If no, delete pharmacy and type the correct one.   Has the prescription been filled recently? Yes  Is the patient out of the medication? No  Has the patient been seen for an appointment in the last year OR does the patient have an upcoming appointment? Yes  Can we respond through MyChart? Yes  Agent: Please be advised that Rx refills may take up to 3 business days. We ask that you follow-up with your pharmacy.

## 2024-09-07 NOTE — Telephone Encounter (Signed)
 Copied from CRM [id number]. Topic: Clinical - Medical Advice >> Sep 07, 2024  1:36 PM Adriana Marks wrote: Reason for CRM: pt called in and stated that someone in the office informed her that they will reach back out to her regarding her Prolia  shot. She stated that they were supposed to find out if it would be cheaper for her to go through [employer] or have it administered in the office. Please call and advise.

## 2024-09-08 ENCOUNTER — Encounter: Payer: Self-pay | Admitting: Family Medicine

## 2024-09-08 ENCOUNTER — Other Ambulatory Visit (HOSPITAL_COMMUNITY): Payer: Self-pay

## 2024-09-08 MED ORDER — ALENDRONATE SODIUM 70 MG PO TABS: 70.0000 mg | ORAL_TABLET | ORAL | 3 refills | Status: AC

## 2024-09-08 MED ORDER — SERTRALINE HCL 50 MG PO TABS: 100.0000 mg | ORAL_TABLET | Freq: Every day | ORAL | 3 refills | Status: AC

## 2024-09-08 MED ORDER — OMEPRAZOLE 20 MG PO CPDR: 20.0000 mg | DELAYED_RELEASE_CAPSULE | Freq: Two times a day (BID) | ORAL | 3 refills | Status: AC

## 2024-09-08 NOTE — Telephone Encounter (Signed)
 SABRA

## 2024-09-08 NOTE — Telephone Encounter (Addendum)
 MEDICAL PA SUBMITTED VIA BLUE E.       APPROVED -

## 2024-09-08 NOTE — Telephone Encounter (Signed)
 I sent a teams to Adriana Marks about her endo referral Please let pt know we will work on that [employer]

## 2024-09-08 NOTE — Telephone Encounter (Signed)
 Referral is still pending. I have made pt aware of this information. I have advised her that as soon as I get the information from our pharmacy team, I will contact her. Pt verbalized understanding.

## 2024-09-09 ENCOUNTER — Ambulatory Visit: Payer: Self-pay | Admitting: Internal Medicine

## 2024-09-09 ENCOUNTER — Other Ambulatory Visit

## 2024-09-09 ENCOUNTER — Encounter

## 2024-09-09 ENCOUNTER — Other Ambulatory Visit: Payer: Self-pay | Admitting: Emergency Medicine

## 2024-09-09 NOTE — Telephone Encounter (Signed)
 Called endocrinology they asked for me to reach out to referrals called got voicemail. I have sent teams to contact in office that was given to me to see how to follow up.

## 2024-09-09 NOTE — Telephone Encounter (Addendum)
 I have called office and talked to scheduler I have set patient up for may 7 arrive at 12:50. That is the first appointment available at that office. Wanted to make sure that is ok before we call patient and let her know.

## 2024-09-09 NOTE — Telephone Encounter (Signed)
 Can we try for another clinic if pt agrees?  I don't really want to wait that long, thanks

## 2024-09-09 NOTE — Progress Notes (Unsigned)
 Prescription refill order pending Dr. Ulysses approval to be sent to [facility].

## 2024-09-10 NOTE — Telephone Encounter (Signed)
 Teams sent to referrals to see if there is another office that can see her sooner. If so will update referral. I have also sent message to patient to make sure she is ok with another office I know her granddaughter worked at this one in the past she may have preference to this office.

## 2024-09-13 NOTE — Telephone Encounter (Signed)
 Pt ready for scheduling for PROLIA  on or after : 09/13/24  Option# 1: Buy/Bill (Office supplied medication)  Out-of-pocket cost due at time of clinic visit: $352  Number of injection/visits approved: 2  Primary: [employer] Prolia  co-insurance: 20% Admin fee co-insurance: 0%  Secondary: [place] MEDICAID Prolia  co-insurance: Prolia  is not covered due to no medical coverage Admin fee co-insurance:   Medical Benefit Details: Date Benefits were checked: 09/08/24 Deductible: NO/ Coinsurance: 20%/ Admin Fee: 0%  Prior Auth: APPROVED PA# [id number] Expiration Date: 09/08/24-09/08/25  # of doses approved: 2 ----------------------------------------------------------------------- Option# 2- Med Obtained from pharmacy: [facility] PREFERRED FOR PHARMACY BENEFIT   Pharmacy benefit: Copay $720.96 (Paid to pharmacy) Admin Fee: 0% (Pay at clinic)  Prior Auth: N/A PA# Expiration Date:   # of doses approved:   If patient wants fill through the pharmacy benefit please send prescription to: [facility] and include estimated need by date in rx notes. Pharmacy will ship medication directly to the office.  Patient NOT eligible for Prolia  Copay Card. Copay Card can make patient's cost as little as $25. Link to apply: [email or web address]://[email or web address]  ** This summary of benefits is an estimation of the patient's out-of-pocket cost. Exact cost may very based on individual plan coverage.

## 2024-09-14 ENCOUNTER — Encounter: Payer: Self-pay | Admitting: Cardiology

## 2024-09-14 ENCOUNTER — Ambulatory Visit: Attending: Cardiology | Admitting: Cardiology

## 2024-09-14 ENCOUNTER — Telehealth: Payer: Self-pay | Admitting: Internal Medicine

## 2024-09-14 VITALS — BP 170/84 | HR 69 | Ht 61.5 in | Wt 178.2 lb

## 2024-09-14 DIAGNOSIS — R011 Cardiac murmur, unspecified: Secondary | ICD-10-CM

## 2024-09-14 DIAGNOSIS — I10 Essential (primary) hypertension: Secondary | ICD-10-CM | POA: Diagnosis not present

## 2024-09-14 DIAGNOSIS — R0609 Other forms of dyspnea: Secondary | ICD-10-CM

## 2024-09-14 DIAGNOSIS — Z79899 Other long term (current) drug therapy: Secondary | ICD-10-CM

## 2024-09-14 MED ORDER — SPIRONOLACTONE 25 MG PO TABS: 12.5000 mg | ORAL_TABLET | Freq: Every day | ORAL | 3 refills | Status: AC

## 2024-09-14 NOTE — Telephone Encounter (Signed)
" °*  STAT* If patient is at the pharmacy, call can be transferred to refill team.   1. Which medications need to be refilled? (please list name of each medication and dose if known)   nebivolol  (BYSTOLIC ) 10 MG tablet   2. Would you like to learn more about the convenience, safety, & potential cost savings by using the [facility]?   3. Are you open to using the [facility] (Type [facility]. ).  4. Which pharmacy/location (including street and city if local pharmacy) is medication to be sent to?  [employer] - [facility] Delivery - [place] - [address]   5. Do they need a 30 day or 90 day supply?   Patient stated she will be using [employer] - [facility] Delivery - [place] - [place] [address] going forward and will need this medication transferred to her new pharmacy.  "

## 2024-09-14 NOTE — Progress Notes (Signed)
 " Cardiology Office Note   Date:  09/14/2024  ID:  Katerin, Negrete June 15, 1946, MRN [id number] PCP: Randeen Laine LABOR, MD   [facility] Providers Cardiologist:  Lonni Hanson, MD     History of Present Illness Adriana Marks is a 79 y.o. female with a past medical history of hypertension, hyperlipidemia, heart murmur, GERD, obstructive sleep apnea, migraine headaches, depression/anxiety, who presents today for follow-up.   She started following Dr. Hanson in July 2025 at that time she noted a long history difficult to manage blood pressure.  It been complicated by intolerance to several agents including amlodipine  and HCTZ.  After further agreement she was switched from metoprolol  to Nebivolol  and performed a secondary workup for hypertension.  Renal artery Doppler showed mild bilateral renal artery stenosis (1-59%).  Serum aldosterone/plasma renin activity ratio was also normal.  Echocardiogram showed a normal LVEF with aortic sclerosis and mild MR.  She was last seen in clinic 09/01/2024 and noted that her blood pressures been high recently.  She attributes some of this to dietary indiscretion with more sodium and sugar intake over the holidays.  She was referred to the emergency department and recommended to continue current doses and today below and olmesartan .  In the long-term addition of spironolactone  should be considered and given that thiazide diuretics were complicated by hyponatremia.   She returns to clinic today stating overall from a cardiac perspective she has been doing well.  She states that her blood pressure has been better controlled at home and is recently, no blood pressure cuff.  She states that she feels as though some of her elevated blood pressure was as she went through a sinus infection, COVID, and strep.  She states that she was evaluated in the emergency department for elevated blood pressure was treated with IV medications.  She denies any chest pain, shortness of  breath, palpitations, lightheadedness, or weakness.  States that she has been compliant with her current medication regimen without any undue side effects.  ROS: 10 point review of system has been reviewed and considered negative the exception was been listed in the HPI  Studies Reviewed EKG Interpretation Date/Time:  Tuesday September 14 2024 10:09:57 EST Ventricular Rate:  69 PR Interval:  176 QRS Duration:  78 QT Interval:  410 QTC Calculation: 439 R Axis:   -2  Text Interpretation: Normal sinus rhythm Normal ECG When compared with ECG of 10-Mar-2024 13:59, No significant change was found Confirmed by Gerard Frederick ([id number]) on 09/14/2024 10:13:39 AM    TTE (04/21/2024): Normal LV size and wall thickness.  LVEF 60-65% with normal wall motion and grade 1 diastolic dysfunction.  GLS -19.6%.  Normal RV size and function.  Normal biatrial size.  No pericardial effusion.  Mildly thickened mitral valve leaflets with mild regurgitation.  Aortic sclerosis without stenosis.  Normal CVP.   Renal artery Doppler (03/22/2024): Mild bilateral renal artery stenosis (1-59%).  Risk Assessment/Calculations   HYPERTENSION CONTROL Vitals:   09/14/24 1007 09/14/24 1022  BP: (!) 180/80 (!) 170/84    The patient's blood pressure is elevated above target today.  In order to address the patient's elevated BP: A new medication was prescribed today.          Physical Exam VS:  BP (!) 170/84 (BP Location: Left Arm, Patient Position: Sitting, Cuff Size: Normal)   Pulse 69   Ht 5' 1.5 (1.562 m)   Wt 178 lb 4 oz (80.9 kg)   LMP 08/26/1997  SpO2 98%   BMI 33.13 kg/m        Wt Readings from Last 3 Encounters:  09/14/24 178 lb 4 oz (80.9 kg)  09/01/24 171 lb 3.2 oz (77.7 kg)  08/05/24 167 lb 8 oz (76 kg)    GEN: Well nourished, well developed in no acute distress NECK: No JVD; No carotid bruits CARDIAC: RRR, I/VI systolic murmur, without rubs or gallops RESPIRATORY:  Clear to auscultation without  rales, wheezing or rhonchi  ABDOMEN: Soft, non-tender, non-distended EXTREMITIES:  No edema; No deformity   ASSESSMENT AND PLAN Uncontrolled hypertension with recent hypertensive urgency requiring emergency department visit.  Blood pressure today is elevated at 180/80 with repeat of 170/84.  She states that her blood pressure has been in the 140s at home as recently if she had just gotten a new blood pressure cuff.  She was recently treated in the emergency department started on hydralazine  10 mg 3 times daily.  She states she has been compliant with her medications that she has continued on hydralazine , Nebivolol  10 mg daily and olmesartan  40 mg daily.  EKG today reveals sinus rhythm with a rate of 69 with no acute ischemic changes noted.  She had renal artery duplex that was negative and no concerns for hyperaldosteronism other labs.  With continued elevated blood pressure she has been started on spironolactone  12.5 mg daily.  She will need a repeat BMP in 2 weeks.  She has been encouraged to continue to monitor her pressures 1 to 2 hours postmedication administration as well.  Heart murmur with occasional dyspnea on exertion with recent echocardiogram completed revealing an LVEF of 60-65%, no regional wall motion abnormalities, G1 DD with only mild mitral regurgitation.  She did have aortic valve sclerosis that was noted with no evidence of aortic valve stenosis.  Will continue to monitor with surveillance studies.       Dispo: Patient to return to clinic to see MD/APP in 6 weeks or sooner if needed for further evaluation after initiation of spironolactone .  Signed, Dondrea Clendenin, NP   "

## 2024-09-14 NOTE — Patient Instructions (Addendum)
 Medication Instructions:   Your physician recommends the following medication changes.  START TAKING: Spironolactone  12.5 mg (1/2 of 25 mg tablet) once daily  *If you need a refill on your cardiac medications before your next appointment, please call your pharmacy*  Lab Work:  Your provider would like for you to return in 2 weeks to have the following labs drawn: BMP.   Please go to [facility] [address] ([facility] [address], [place] [address] You do not need an appointment.  They are open from 8 am- 4:30 pm.  Lunch from 1:00 pm- 2:00 pm You DO NOT need to be fasting.   You may also go to one of the following [facility]:  [address] [place], [place] [address] Phone: [phone] Lab hours: Mon-Fri 8 am- 5 pm    Lunch 12 pm- 1 pm  [address] [place],  [place]  [address]  [place]  Phone: [phone] Lab hours: 7 am- 4 pm Lunch 12 pm-1 pm   [address] [place],  [place]  [address]  [place]  Phone: [phone] Lab hours: Mon-Fri 8 am- 5 pm    Lunch 12 pm- 1 pm   If you have labs (blood work) drawn today and your tests are completely normal, you will receive your results only by:  MyChart Message (if you have MyChart) OR  A paper copy in the mail If you have any lab test that is abnormal or we need to change your treatment, we will call you to review the results.  Testing/Procedures:  None ordered at this time   Referrals:  None ordered at this time   Follow-Up:  At [facility], you and your health needs are our priority.  As part of our continuing mission to provide you with exceptional heart care, our providers are all part of one team.  This team includes your primary Cardiologist (physician) and Advanced Practice Providers or APPs (Physician Assistants and Nurse Practitioners) who all work together to provide you with the care you need, when you need it.  Your next appointment:   6 week(s)  Provider:    Lonni Hanson, MD     We recommend signing up for the patient portal called MyChart.  Sign up information is provided on this After Visit Summary.  MyChart is used to connect with patients for Virtual Visits (Telemedicine).  Patients are able to view lab/test results, encounter notes, upcoming appointments, etc.  Non-urgent messages can be sent to your provider as well.   To learn more about what you can do with MyChart, go to [email or web address].

## 2024-09-16 ENCOUNTER — Encounter: Payer: Self-pay | Admitting: Family Medicine

## 2024-09-20 ENCOUNTER — Telehealth: Payer: Self-pay | Admitting: *Deleted

## 2024-09-20 ENCOUNTER — Other Ambulatory Visit (HOSPITAL_COMMUNITY): Payer: Self-pay

## 2024-09-20 MED ORDER — NEBIVOLOL HCL 10 MG PO TABS: 10.0000 mg | ORAL_TABLET | Freq: Every day | ORAL | 3 refills | Status: AC

## 2024-09-20 NOTE — Telephone Encounter (Signed)
 Copied from CRM [id number]. Topic: Clinical - Medication Question >> Sep 20, 2024 10:11 AM Alfonso ORN wrote: Reason for CRM: [employer] called will send a quantity limit form for  sertraline  (ZOLOFT ) 50 MG tablet for pt via fax. callback : [phone] opt 5   provided fax.

## 2024-09-20 NOTE — Telephone Encounter (Signed)
Refill sent to [facility] 

## 2024-09-21 ENCOUNTER — Other Ambulatory Visit (HOSPITAL_COMMUNITY): Payer: Self-pay

## 2024-09-21 ENCOUNTER — Telehealth: Payer: Self-pay

## 2024-09-21 NOTE — Telephone Encounter (Signed)
 Pharmacy Patient Advocate Encounter  Received notification from [facility] that Prior Authorization for Sertraline  HCl 50MG  tablets  has been APPROVED from 09/20/2024 to 09/20/2025. Unable to obtain price due to refill too soon rejection, last fill date 09/09/2024 next available fill date 10/02/2024   PA #/Case ID/Reference #: [id number]

## 2024-09-21 NOTE — Telephone Encounter (Signed)
 Pharmacy Patient Advocate Encounter   Received notification from Physician's Office that prior authorization for Sertraline  HCl 50MG  tablets  is required/requested.   Insurance verification completed.   The patient is insured through [facility].   Per test claim: PA required; PA submitted to above mentioned insurance via Latent Key/confirmation #/[id number] Status is pending

## 2024-09-30 ENCOUNTER — Encounter: Payer: Self-pay | Admitting: Internal Medicine

## 2024-10-01 ENCOUNTER — Other Ambulatory Visit: Payer: Self-pay | Admitting: Emergency Medicine

## 2024-10-01 MED ORDER — HYDRALAZINE HCL 10 MG PO TABS: 10.0000 mg | ORAL_TABLET | Freq: Three times a day (TID) | ORAL | 3 refills | Status: AC

## 2024-10-01 NOTE — Telephone Encounter (Signed)
 Okay to refill as it appears that she was currently on this medication at her last visit in clinic.

## 2024-10-01 NOTE — Progress Notes (Signed)
 Refill for Hydralazine  10 mg TID sent to [facility].

## 2024-10-05 ENCOUNTER — Other Ambulatory Visit: Payer: Self-pay

## 2024-10-27 ENCOUNTER — Ambulatory Visit: Admitting: Internal Medicine

## 2024-12-30 ENCOUNTER — Ambulatory Visit: Admitting: "Endocrinology

## 2025-05-05 ENCOUNTER — Ambulatory Visit

## 2025-05-06 ENCOUNTER — Ambulatory Visit

## 2025-08-02 ENCOUNTER — Other Ambulatory Visit

## 2025-08-09 ENCOUNTER — Encounter: Admitting: Family Medicine
